# Patient Record
Sex: Female | Born: 1980
Health system: Southern US, Community
[De-identification: ages and names within clinical notes are randomized; demographics above are authoritative.]

## PROBLEM LIST (undated history)

## (undated) DIAGNOSIS — I871 Compression of vein: Secondary | ICD-10-CM

## (undated) DIAGNOSIS — K219 Gastro-esophageal reflux disease without esophagitis: Secondary | ICD-10-CM

## (undated) DIAGNOSIS — I2699 Other pulmonary embolism without acute cor pulmonale: Secondary | ICD-10-CM

## (undated) DIAGNOSIS — R51 Headache: Secondary | ICD-10-CM

## (undated) DIAGNOSIS — M797 Fibromyalgia: Secondary | ICD-10-CM

## (undated) DIAGNOSIS — R519 Headache, unspecified: Secondary | ICD-10-CM

## (undated) DIAGNOSIS — Z85820 Personal history of malignant melanoma of skin: Secondary | ICD-10-CM

## (undated) DIAGNOSIS — J329 Chronic sinusitis, unspecified: Secondary | ICD-10-CM

## (undated) DIAGNOSIS — R112 Nausea with vomiting, unspecified: Secondary | ICD-10-CM

## (undated) DIAGNOSIS — Z9889 Other specified postprocedural states: Secondary | ICD-10-CM

## (undated) HISTORY — DX: Chronic sinusitis, unspecified: J32.9

## (undated) HISTORY — DX: Fibromyalgia: M79.7

## (undated) HISTORY — PX: CHOLECYSTECTOMY: SHX55

## (undated) HISTORY — DX: Personal history of malignant melanoma of skin: Z85.820

## (undated) HISTORY — DX: Compression of vein: I87.1

---

## 2009-07-18 ENCOUNTER — Ambulatory Visit: Payer: Self-pay | Admitting: Diagnostic Radiology

## 2009-07-18 ENCOUNTER — Emergency Department (HOSPITAL_BASED_OUTPATIENT_CLINIC_OR_DEPARTMENT_OTHER): Admission: EM | Admit: 2009-07-18 | Discharge: 2009-07-18 | Payer: Self-pay | Admitting: Emergency Medicine

## 2011-01-28 LAB — URINALYSIS, ROUTINE W REFLEX MICROSCOPIC
Bilirubin Urine: NEGATIVE
Hgb urine dipstick: NEGATIVE
Nitrite: NEGATIVE
Specific Gravity, Urine: 1.005 (ref 1.005–1.030)
pH: 6 (ref 5.0–8.0)

## 2011-01-28 LAB — URINE MICROSCOPIC-ADD ON

## 2011-01-28 LAB — PREGNANCY, URINE: Preg Test, Ur: NEGATIVE

## 2015-08-26 ENCOUNTER — Encounter: Payer: Self-pay | Admitting: Family Medicine

## 2015-08-26 ENCOUNTER — Ambulatory Visit (INDEPENDENT_AMBULATORY_CARE_PROVIDER_SITE_OTHER): Payer: BLUE CROSS/BLUE SHIELD | Admitting: Family Medicine

## 2015-08-26 VITALS — BP 130/80 | HR 113 | Temp 99.7°F | Ht 67.25 in | Wt 158.0 lb

## 2015-08-26 DIAGNOSIS — L659 Nonscarring hair loss, unspecified: Secondary | ICD-10-CM

## 2015-08-26 DIAGNOSIS — Z0000 Encounter for general adult medical examination without abnormal findings: Secondary | ICD-10-CM

## 2015-08-26 DIAGNOSIS — M797 Fibromyalgia: Secondary | ICD-10-CM | POA: Diagnosis not present

## 2015-08-26 DIAGNOSIS — J302 Other seasonal allergic rhinitis: Secondary | ICD-10-CM | POA: Diagnosis not present

## 2015-08-26 DIAGNOSIS — R000 Tachycardia, unspecified: Secondary | ICD-10-CM | POA: Diagnosis not present

## 2015-08-26 MED ORDER — MONTELUKAST SODIUM 10 MG PO TABS: 10.0000 mg | ORAL_TABLET | Freq: Every day | ORAL | Status: DC

## 2015-08-26 MED ORDER — MONO-LINYAH 0.25-35 MG-MCG PO TABS: 1.0000 | ORAL_TABLET | Freq: Every day | ORAL | Status: DC

## 2015-08-26 MED ORDER — TRAZODONE HCL 50 MG PO TABS: 50.0000 mg | ORAL_TABLET | Freq: Every evening | ORAL | Status: DC | PRN

## 2015-08-26 NOTE — Progress Notes (Signed)
BP 130/80 mmHg  Pulse 113  Temp(Src) 99.7 F (37.6 C)  Ht 5' 7.25" (1.708 m)  Wt 158 lb (71.668 kg)  BMI 24.57 kg/m2  SpO2 100%  LMP 08/10/2015 (Approximate)   Subjective:    Patient ID: Ana Knapp, female    DOB: October 18, 1981, 34 y.o.   MRN: [id number]  HPI: Ana Knapp is a 34 y.o. female  Chief Complaint  Patient presents with  . Establish Care    would like to have her thyroid checked, she has family history of symptoms.   Patient is here to establish care  She will have episodes of sweating, feelig like body is hot and on fire; this has been happening for a while; hair started coming out not long ago; has trouble going to the bathroom, can go 3-4 days in between good BM; that's not new; doctors told her to take Miralax; she is eating Activia which helps; skin no big changes; she stopped exercising because she doesn't feel like herself; fatigue; heart rate fast today, but kind of nervous coming to the doctor; does Coke Zero with her Miralax; drinks water too  She has chronic sinusitis; was using drops under the tongue but lost job and couldn't afford it any more; that was for allergies; she has cat, but is allergic to her; keeps house clean; doing a lot of sneezing; she tries to move her away from her face; takes Claritin-D every day for that; she does not like nasal sprays  Fibromyalgia since age 34; not exercising any more for the last few months; might lift weights, but not nearly as much as before; trouble falling and staying asleep; she has been on anti-depressants but doesn't do well; has not   Past Medical History  Diagnosis Date  . Chronic sinusitis   . Fibromyalgia    On OCP; last pap was last year; no hx of abnormal paps; next due in 2018; she in on OCPs for period regulation; on it for 6 years; sexually active, thinking about other options; no migraines; nonsmoking  Relevant past medical, surgical, family and social history reviewed and updated as  indicated. Mother has thyroid problems, starting in her thirties  Allergies and medications reviewed and updated.  Review of Systems Per HPI unless specifically indicated above     Objective:    BP 130/80 mmHg  Pulse 113  Temp(Src) 99.7 F (37.6 C)  Ht 5' 7.25" (1.708 m)  Wt 158 lb (71.668 kg)  BMI 24.57 kg/m2  SpO2 100%  LMP 08/10/2015 (Approximate)  Wt Readings from Last 3 Encounters:  08/26/15 158 lb (71.668 kg)    Physical Exam  Constitutional: She appears well-developed and well-nourished.  HENT:  Head: Normocephalic and atraumatic.  Mouth/Throat: Mucous membranes are normal.  No visible areas of outright alopecia; no significant thinning or breakage of hair noted  Eyes: EOM are normal. No scleral icterus.  Cardiovascular: Regular rhythm.  Tachycardia present.   Pulmonary/Chest: Effort normal and breath sounds normal.  Psychiatric: She has a normal mood and affect. Her behavior is normal.       Assessment & Plan:   Problem List Items Addressed This Visit      Respiratory   Seasonal allergic rhinitis    Add montelukast; avoid triggers when possible        Musculoskeletal and Integument   Fibromyalgia    Importance of sleep, regular exercise discussed; will start low-dose trazodone      Hair loss    May be related  to thyroid dysfunction; could also be telogen effluvium        Other   Preventative health care    Physical not done; using z00.00 code for yearly screening labs      Relevant Orders   Lipid Panel w/o Chol/HDL Ratio (Completed)   Comprehensive metabolic panel (Completed)   Tachycardia with 100 - 120 beats per minute - Primary    Check labs to include thyroid function, CBC; further work-up if needed including EKG, holter monitor; avoid decongestants, caffeine      Relevant Orders   TSH (Completed)   T3, free (Completed)   T4, free (Completed)   CBC with Differential/Platelet (Completed)       Follow up plan: Return in about 1  month (around 09/25/2015) for follw-up, tachycardia, allergies.  An after-visit summary was printed and given to the patient at [place].  Please see the patient instructions which may contain other information and recommendations beyond what is mentioned above in the assessment and plan. Orders Placed This Encounter  Procedures  . TSH  . T3, free  . T4, free  . Lipid Panel w/o Chol/HDL Ratio  . CBC with Differential/Platelet  . Comprehensive metabolic panel   Meds ordered this encounter  Medications  . DISCONTD: MONO-LINYAH 0.25-35 MG-MCG tablet    Sig: Take by mouth daily.    Refill:  0  . traZODone (DESYREL) 50 MG tablet    Sig: Take 1 tablet (50 mg total) by mouth at bedtime as needed for sleep.    Dispense:  30 tablet    Refill:  3  . montelukast (SINGULAIR) 10 MG tablet    Sig: Take 1 tablet (10 mg total) by mouth at bedtime.    Dispense:  30 tablet    Refill:  3  . MONO-LINYAH 0.25-35 MG-MCG tablet    Sig: Take 1 tablet by mouth daily.    Dispense:  1 Package    Refill:  2

## 2015-08-26 NOTE — Patient Instructions (Addendum)
Try to use PLAIN allergy medicine without the decongestant Avoid: phenylephrine, phenylpropanolamine, and pseudoephredine Start montelukast (Singulair) for allergies, that is in addition to the anti-histamine Start the new medicine for sleep to help your fibromyalgia We'll get the labs and contact you Return in one month  Allergies An allergy is when your body reacts to a substance in a way that is not normal. An allergic reaction can happen after you:  Eat something.  Breathe in something.  Touch something. WHAT KINDS OF ALLERGIES ARE THERE? You can be allergic to:  Things that are only around during certain seasons, like molds and pollens.  Foods.  Drugs.  Insects.  Animal dander. WHAT ARE SYMPTOMS OF ALLERGIES?  Puffiness (swelling). This may happen on the lips, face, tongue, mouth, or throat.  Sneezing.  Coughing.  Breathing loudly (wheezing).  Stuffy nose.  Tingling in the mouth.  A rash.  Itching.  Itchy, red, puffy areas of skin (hives).  Watery eyes.  Throwing up (vomiting).  Watery poop (diarrhea).  Dizziness.  Feeling faint or fainting.  Trouble breathing or swallowing.  A tight feeling in the chest.  A fast heartbeat. HOW ARE ALLERGIES DIAGNOSED? Allergies can be diagnosed with:  A medical and family history.  Skin tests.  Blood tests.  A food diary. A food diary is a record of all the foods, drinks, and symptoms you have each day.  The results of an elimination diet. This diet involves making sure not to eat certain foods and then seeing what happens when you start eating them again. HOW ARE ALLERGIES TREATED? There is no cure for allergies, but allergic reactions can be treated with medicine. Severe reactions usually need to be treated at a hospital.  HOW CAN REACTIONS BE PREVENTED? The best way to prevent an allergic reaction is to avoid the thing you are allergic to. Allergy shots and medicines can also help prevent reactions  in some cases.   This information is not intended to replace advice given to you by your health care provider. Make sure you discuss any questions you have with your health care provider.   Document Released: 02/04/2013 Document Revised: 10/31/2014 Document Reviewed: 07/22/2014 Elsevier Interactive Patient Education [employer].

## 2015-08-27 LAB — COMPREHENSIVE METABOLIC PANEL
A/G RATIO: 1.5 (ref 1.1–2.5)
ALBUMIN: 4.1 g/dL (ref 3.5–5.5)
ALT: 17 IU/L (ref 0–32)
AST: 16 IU/L (ref 0–40)
Alkaline Phosphatase: 24 IU/L — ABNORMAL LOW (ref 39–117)
BUN / CREAT RATIO: 23 — AB (ref 8–20)
BUN: 18 mg/dL (ref 6–20)
Bilirubin Total: 0.2 mg/dL (ref 0.0–1.2)
CALCIUM: 9.1 mg/dL (ref 8.7–10.2)
CO2: 24 mmol/L (ref 18–29)
CREATININE: 0.8 mg/dL (ref 0.57–1.00)
Chloride: 97 mmol/L (ref 97–106)
GFR calc Af Amer: 110 mL/min/{1.73_m2} (ref >59)
GFR, EST NON AFRICAN AMERICAN: 95 mL/min/{1.73_m2} (ref >59)
GLOBULIN, TOTAL: 2.8 g/dL (ref 1.5–4.5)
Glucose: 121 mg/dL — ABNORMAL HIGH (ref 65–99)
POTASSIUM: 3.9 mmol/L (ref 3.5–5.2)
SODIUM: 137 mmol/L (ref 136–144)
Total Protein: 6.9 g/dL (ref 6.0–8.5)

## 2015-08-27 LAB — CBC WITH DIFFERENTIAL/PLATELET
BASOS: 1 %
Basophils Absolute: 0.1 10*3/uL (ref 0.0–0.2)
EOS (ABSOLUTE): 0.4 10*3/uL (ref 0.0–0.4)
EOS: 5 %
HEMATOCRIT: 36.8 % (ref 34.0–46.6)
HEMOGLOBIN: 12.3 g/dL (ref 11.1–15.9)
IMMATURE GRANULOCYTES: 0 %
Immature Grans (Abs): 0 10*3/uL (ref 0.0–0.1)
LYMPHS: 32 %
Lymphocytes Absolute: 2.4 10*3/uL (ref 0.7–3.1)
MCH: 27.7 pg (ref 26.6–33.0)
MCHC: 33.4 g/dL (ref 31.5–35.7)
MCV: 83 fL (ref 79–97)
MONOCYTES: 5 %
MONOS ABS: 0.4 10*3/uL (ref 0.1–0.9)
NEUTROS PCT: 57 %
Neutrophils Absolute: 4.3 10*3/uL (ref 1.4–7.0)
Platelets: 259 10*3/uL (ref 150–379)
RBC: 4.44 x10E6/uL (ref 3.77–5.28)
RDW: 14.4 % (ref 12.3–15.4)
WBC: 7.6 10*3/uL (ref 3.4–10.8)

## 2015-08-27 LAB — LIPID PANEL W/O CHOL/HDL RATIO
Cholesterol, Total: 161 mg/dL (ref 100–199)
HDL: 77 mg/dL (ref >39)
LDL Calculated: 73 mg/dL (ref 0–99)
Triglycerides: 55 mg/dL (ref 0–149)
VLDL Cholesterol Cal: 11 mg/dL (ref 5–40)

## 2015-08-27 LAB — TSH: TSH: 1.82 u[IU]/mL (ref 0.450–4.500)

## 2015-08-27 LAB — T3, FREE: T3 FREE: 2.8 pg/mL (ref 2.0–4.4)

## 2015-08-27 LAB — T4, FREE: FREE T4: 1.08 ng/dL (ref 0.82–1.77)

## 2015-08-30 DIAGNOSIS — Z0000 Encounter for general adult medical examination without abnormal findings: Secondary | ICD-10-CM | POA: Insufficient documentation

## 2015-08-30 DIAGNOSIS — L659 Nonscarring hair loss, unspecified: Secondary | ICD-10-CM | POA: Insufficient documentation

## 2015-08-30 DIAGNOSIS — R000 Tachycardia, unspecified: Secondary | ICD-10-CM | POA: Insufficient documentation

## 2015-08-30 DIAGNOSIS — M797 Fibromyalgia: Secondary | ICD-10-CM | POA: Insufficient documentation

## 2015-08-30 DIAGNOSIS — J302 Other seasonal allergic rhinitis: Secondary | ICD-10-CM | POA: Insufficient documentation

## 2015-08-30 NOTE — Assessment & Plan Note (Signed)
Physical not done; using z00.00 code for yearly screening labs

## 2015-08-30 NOTE — Assessment & Plan Note (Addendum)
Check labs to include thyroid function, CBC; further work-up if needed including EKG, holter monitor; avoid decongestants, caffeine

## 2015-08-30 NOTE — Assessment & Plan Note (Signed)
May be related to thyroid dysfunction; could also be telogen effluvium

## 2015-08-30 NOTE — Assessment & Plan Note (Addendum)
Importance of sleep, regular exercise discussed; will start low-dose trazodone

## 2015-08-30 NOTE — Assessment & Plan Note (Signed)
Add montelukast; avoid triggers when possible

## 2015-09-24 ENCOUNTER — Encounter: Payer: Self-pay | Admitting: Family Medicine

## 2015-09-24 ENCOUNTER — Ambulatory Visit (INDEPENDENT_AMBULATORY_CARE_PROVIDER_SITE_OTHER): Payer: BLUE CROSS/BLUE SHIELD | Admitting: Family Medicine

## 2015-09-24 VITALS — BP 119/71 | HR 82 | Temp 98.6°F | Wt 162.0 lb

## 2015-09-24 DIAGNOSIS — Z30011 Encounter for initial prescription of contraceptive pills: Secondary | ICD-10-CM | POA: Diagnosis not present

## 2015-09-24 DIAGNOSIS — R1013 Epigastric pain: Secondary | ICD-10-CM | POA: Diagnosis not present

## 2015-09-24 DIAGNOSIS — G8929 Other chronic pain: Secondary | ICD-10-CM

## 2015-09-24 DIAGNOSIS — K219 Gastro-esophageal reflux disease without esophagitis: Secondary | ICD-10-CM

## 2015-09-24 DIAGNOSIS — L659 Nonscarring hair loss, unspecified: Secondary | ICD-10-CM | POA: Diagnosis not present

## 2015-09-24 DIAGNOSIS — R232 Flushing: Secondary | ICD-10-CM

## 2015-09-24 MED ORDER — SUCRALFATE 1 GM/10ML PO SUSP: 1.0000 g | Freq: Three times a day (TID) | ORAL | Status: DC

## 2015-09-24 MED ORDER — NORGESTIM-ETH ESTRAD TRIPHASIC 0.18/0.215/0.25 MG-35 MCG PO TABS: 1.0000 | ORAL_TABLET | Freq: Every day | ORAL | Status: DC

## 2015-09-24 MED ORDER — PANTOPRAZOLE SODIUM 40 MG PO TBEC: 40.0000 mg | DELAYED_RELEASE_TABLET | Freq: Every day | ORAL | Status: DC

## 2015-09-24 NOTE — Progress Notes (Signed)
BP 119/71 mmHg  Pulse 82  Temp(Src) 98.6 F (37 C)  Wt 162 lb (73.483 kg)  SpO2 98%  LMP 09/10/2015   Subjective:    Patient ID: Ana Knapp, female    DOB: 09/26/1981, 34 y.o.   MRN: [id number]  HPI: Ana Knapp is a 34 y.o. female  Chief Complaint  Patient presents with  . Tachycardia    follow up, still notices it sometimes at night  . Allergic Rhinitis   . Gastroesophageal Reflux   She is still having some heart beating rapidly at night; she has stopped working out; she just break out in hot sweat; day or night; can happen for any reason; not related to ambient temperature; she already did the holter monitor in college; fast heart back then; no one in the family with re-entrant tachycardia; she drinks coke zero and green tea, lots of water; stopped decongestants  She is using OCP, Mono-Linyah, and wonders if that might be the hair loss; has been on it for a few years; hair loss is not quite as bad, slowed down a little bit, leveled off That is the only OCP she has been on ever; nonsmoker; she has had headaches, but thinks it is just regular sinus headaches, no aura, no migraine Taking a vitamin to help with energy level; she is gaining weight; 145 pounds would be a good weight for her  Sneezing a lot lately; sinuses have been pretty bad; she has used nasal sprays in the past, but doesn't really help, even gets sinus infection  She has been having problems with GERD, reflux; using OTC Nexium; has pretty bad across the upper abdomen; took tums,   Does gets nauseated at times and feels anxoius with the spells of sweating; few mood swings; might be related to birth control she thinks  Hard time swallowing; abdominal pain in the epigastrium and left of midline; going on for two years; had the EGD and they told her to take miralax; they told her she had a "back-up" (stool presumably); no blood in stool; taking miralax and activia  Relevant past medical, surgical, family and  social history reviewed and updated as indicated Past Medical History  Diagnosis Date  . Chronic sinusitis   . Fibromyalgia    Interim medical history since our last visit reviewed. Allergies and medications reviewed and updated.  Review of Systems  Per HPI unless specifically indicated above     Objective:    BP 119/71 mmHg  Pulse 82  Temp(Src) 98.6 F (37 C)  Wt 162 lb (73.483 kg)  SpO2 98%  LMP 09/10/2015  Wt Readings from Last 3 Encounters:  09/24/15 162 lb (73.483 kg)  08/26/15 158 lb (71.668 kg)    Physical Exam  Constitutional: She appears well-developed and well-nourished. She does not appear ill. No distress.  Weight gain four pounds in last month  HENT:  Head: Normocephalic and atraumatic. Hair is normal.  Mouth/Throat: Oropharynx is clear and moist and mucous membranes are normal.  Neck: No JVD present. No thyromegaly present.  Cardiovascular: Normal rate and regular rhythm.   No murmur heard. Pulmonary/Chest: Effort normal and breath sounds normal.  Abdominal: Soft. Bowel sounds are normal. She exhibits no mass. There is no hepatosplenomegaly. There is tenderness (mildly tender epigastrium). There is no rigidity and no guarding.  Neurological: She displays no tremor.  Skin: No ecchymosis noted. She is not diaphoretic. No erythema. No pallor.  No flushing  Psychiatric: She has a normal mood and affect.  Her mood appears not anxious.   Results for orders placed or performed in visit on 08/26/15  TSH  Result Value Ref Range   TSH 1.820 0.450 - 4.500 uIU/mL  T3, free  Result Value Ref Range   T3, Free 2.8 2.0 - 4.4 pg/mL  T4, free  Result Value Ref Range   Free T4 1.08 0.82 - 1.77 ng/dL  Lipid Panel w/o Chol/HDL Ratio  Result Value Ref Range   Cholesterol, Total 161 100 - 199 mg/dL   Triglycerides 55 0 - 149 mg/dL   HDL 77 >39 mg/dL   VLDL Cholesterol Cal 11 5 - 40 mg/dL   LDL Calculated 73 0 - 99 mg/dL  CBC with Differential/Platelet  Result Value  Ref Range   WBC 7.6 3.4 - 10.8 x10E3/uL   RBC 4.44 3.77 - 5.28 x10E6/uL   Hemoglobin 12.3 11.1 - 15.9 g/dL   Hematocrit 36.8 34.0 - 46.6 %   MCV 83 79 - 97 fL   MCH 27.7 26.6 - 33.0 pg   MCHC 33.4 31.5 - 35.7 g/dL   RDW 14.4 12.3 - 15.4 %   Platelets 259 150 - 379 x10E3/uL   Neutrophils 57 %   Lymphs 32 %   Monocytes 5 %   Eos 5 %   Basos 1 %   Neutrophils Absolute 4.3 1.4 - 7.0 x10E3/uL   Lymphocytes Absolute 2.4 0.7 - 3.1 x10E3/uL   Monocytes Absolute 0.4 0.1 - 0.9 x10E3/uL   EOS (ABSOLUTE) 0.4 0.0 - 0.4 x10E3/uL   Basophils Absolute 0.1 0.0 - 0.2 x10E3/uL   Immature Granulocytes 0 %   Immature Grans (Abs) 0.0 0.0 - 0.1 x10E3/uL  Comprehensive metabolic panel  Result Value Ref Range   Glucose 121 (H) 65 - 99 mg/dL   BUN 18 6 - 20 mg/dL   Creatinine, Ser 0.80 0.57 - 1.00 mg/dL   GFR calc non Af Amer 95 >59 mL/min/1.73   GFR calc Af Amer 110 >59 mL/min/1.73   BUN/Creatinine Ratio 23 (H) 8 - 20   Sodium 137 136 - 144 mmol/L   Potassium 3.9 3.5 - 5.2 mmol/L   Chloride 97 97 - 106 mmol/L   CO2 24 18 - 29 mmol/L   Calcium 9.1 8.7 - 10.2 mg/dL   Total Protein 6.9 6.0 - 8.5 g/dL   Albumin 4.1 3.5 - 5.5 g/dL   Globulin, Total 2.8 1.5 - 4.5 g/dL   Albumin/Globulin Ratio 1.5 1.1 - 2.5   Bilirubin Total 0.2 0.0 - 1.2 mg/dL   Alkaline Phosphatase 24 (L) 39 - 117 IU/L   AST 16 0 - 40 IU/L   ALT 17 0 - 32 IU/L      Assessment & Plan:   Problem List Items Addressed This Visit      Cardiovascular and Mediastinum   Flushing - Primary    With abdominal pain; will get 24 hour urine for 5-HIAA      Relevant Orders   5 HIAA, quantitative, Urine, 24 hour     Digestive   GERD (gastroesophageal reflux disease)   Relevant Medications   sucralfate (CARAFATE) 1 GM/10ML suspension   pantoprazole (PROTONIX) 40 MG tablet     Musculoskeletal and Integument   Hair loss    No visible alopecia today; thyroid function tests were normal; may have aspect of telogen effluvium; follow         Other   Abdominal pain, chronic, epigastric    She has apparently had scans, seen GI,  had EGD; ddx includes gastritis, esophagitis; with chronicity and tachycardia and flushing, worth ruling out carcinoid tumor; start medicine and patient to call me with an update in a few weeks      Oral contraceptive prescribed    Will stop current OCP and start another; reviewed risk of stroke, DVT, PE         Follow up plan: No Follow-up on file.   An after-visit summary was printed and given to the patient at [place].  Please see the patient instructions which may contain other information and recommendations beyond what is mentioned above in the assessment and plan. Orders Placed This Encounter  Procedures  . 5 HIAA, quantitative, Urine, 24 hour   Meds ordered this encounter  Medications  . sucralfate (CARAFATE) 1 GM/10ML suspension    Sig: Take 10 mLs (1 g total) by mouth 4 (four) times daily -  with meals and at bedtime.    Dispense:  420 mL    Refill:  0  . pantoprazole (PROTONIX) 40 MG tablet    Sig: Take 1 tablet (40 mg total) by mouth daily.    Dispense:  30 tablet    Refill:  2  . Norgestimate-Ethinyl Estradiol Triphasic (ORTHO TRI-CYCLEN, 28,) 0.18/0.215/0.25 MG-35 MCG tablet    Sig: Take 1 tablet by mouth daily.    Dispense:  1 Package    Refill:  11   Face-to-face time with patient was more than 25 minutes, >50% time spent counseling and coordination of care

## 2015-09-24 NOTE — Patient Instructions (Signed)
Start the new stomach acid reducing medicine pantoprazole Avoid triggers Use the carafate / sucralfate four times a day for the next two weeks Call me with an update in two weeks and we'll decide where to go from there Do the 24 hour urine collection after your next episode of flushing and anxiety Start the new birth control pill pack after you finish your current one, and give me an update after a month or two

## 2015-09-24 NOTE — Progress Notes (Signed)
Pharmacy notified.

## 2015-09-28 DIAGNOSIS — Z30011 Encounter for initial prescription of contraceptive pills: Secondary | ICD-10-CM | POA: Insufficient documentation

## 2015-09-28 DIAGNOSIS — G8929 Other chronic pain: Secondary | ICD-10-CM | POA: Insufficient documentation

## 2015-09-28 DIAGNOSIS — K219 Gastro-esophageal reflux disease without esophagitis: Secondary | ICD-10-CM | POA: Insufficient documentation

## 2015-09-28 DIAGNOSIS — R1013 Epigastric pain: Secondary | ICD-10-CM

## 2015-09-28 NOTE — Assessment & Plan Note (Signed)
With abdominal pain; will get 24 hour urine for 5-HIAA

## 2015-09-28 NOTE — Assessment & Plan Note (Signed)
No visible alopecia today; thyroid function tests were normal; may have aspect of telogen effluvium; follow

## 2015-09-28 NOTE — Assessment & Plan Note (Signed)
She has apparently had scans, seen GI, had EGD; ddx includes gastritis, esophagitis; with chronicity and tachycardia and flushing, worth ruling out carcinoid tumor; start medicine and patient to call me with an update in a few weeks

## 2015-09-28 NOTE — Assessment & Plan Note (Signed)
Will stop current OCP and start another; reviewed risk of stroke, DVT, PE

## 2015-10-08 ENCOUNTER — Encounter: Payer: Self-pay | Admitting: Family Medicine

## 2015-10-08 DIAGNOSIS — J349 Unspecified disorder of nose and nasal sinuses: Secondary | ICD-10-CM

## 2015-10-08 NOTE — Telephone Encounter (Signed)
Routing to provider  

## 2015-10-12 NOTE — Telephone Encounter (Signed)
Routing back to provider for referral

## 2015-10-13 DIAGNOSIS — J349 Unspecified disorder of nose and nasal sinuses: Secondary | ICD-10-CM | POA: Insufficient documentation

## 2015-10-13 NOTE — Addendum Note (Signed)
Addended by: Knut Rondinelli, Satira Anis on: 10/13/2015 01:19 PM   Modules accepted: Orders

## 2015-10-25 DIAGNOSIS — I2699 Other pulmonary embolism without acute cor pulmonale: Secondary | ICD-10-CM | POA: Insufficient documentation

## 2015-11-03 ENCOUNTER — Encounter: Payer: Self-pay | Admitting: Family Medicine

## 2015-11-03 NOTE — Telephone Encounter (Signed)
I called home number after receiving MyChart msg from patient; voice mailbox has not been set up yet so I could not leave msg

## 2015-11-04 ENCOUNTER — Encounter: Payer: Self-pay | Admitting: Family Medicine

## 2015-11-04 ENCOUNTER — Emergency Department
Admission: EM | Admit: 2015-11-04 | Discharge: 2015-11-04 | Disposition: A | Discharge status: Home / Self Care | Payer: BLUE CROSS/BLUE SHIELD | Attending: Emergency Medicine | Admitting: Emergency Medicine

## 2015-11-04 ENCOUNTER — Encounter: Payer: Self-pay | Admitting: *Deleted

## 2015-11-04 ENCOUNTER — Ambulatory Visit (INDEPENDENT_AMBULATORY_CARE_PROVIDER_SITE_OTHER): Payer: BLUE CROSS/BLUE SHIELD

## 2015-11-04 ENCOUNTER — Ambulatory Visit
Admission: EM | Admit: 2015-11-04 | Discharge: 2015-11-04 | Disposition: A | Discharge status: Home / Self Care | Payer: BLUE CROSS/BLUE SHIELD | Attending: Family Medicine | Admitting: Family Medicine

## 2015-11-04 ENCOUNTER — Encounter: Payer: Self-pay | Admitting: Emergency Medicine

## 2015-11-04 DIAGNOSIS — Z792 Long term (current) use of antibiotics: Secondary | ICD-10-CM | POA: Insufficient documentation

## 2015-11-04 DIAGNOSIS — J0100 Acute maxillary sinusitis, unspecified: Secondary | ICD-10-CM

## 2015-11-04 DIAGNOSIS — R232 Flushing: Secondary | ICD-10-CM

## 2015-11-04 DIAGNOSIS — Z793 Long term (current) use of hormonal contraceptives: Secondary | ICD-10-CM | POA: Diagnosis not present

## 2015-11-04 DIAGNOSIS — J40 Bronchitis, not specified as acute or chronic: Secondary | ICD-10-CM

## 2015-11-04 DIAGNOSIS — J069 Acute upper respiratory infection, unspecified: Secondary | ICD-10-CM | POA: Diagnosis not present

## 2015-11-04 DIAGNOSIS — Z7952 Long term (current) use of systemic steroids: Secondary | ICD-10-CM | POA: Insufficient documentation

## 2015-11-04 DIAGNOSIS — Z79899 Other long term (current) drug therapy: Secondary | ICD-10-CM | POA: Insufficient documentation

## 2015-11-04 DIAGNOSIS — J45901 Unspecified asthma with (acute) exacerbation: Secondary | ICD-10-CM | POA: Diagnosis not present

## 2015-11-04 DIAGNOSIS — Z7951 Long term (current) use of inhaled steroids: Secondary | ICD-10-CM | POA: Insufficient documentation

## 2015-11-04 DIAGNOSIS — J0110 Acute frontal sinusitis, unspecified: Secondary | ICD-10-CM | POA: Diagnosis not present

## 2015-11-04 DIAGNOSIS — T380X5A Adverse effect of glucocorticoids and synthetic analogues, initial encounter: Secondary | ICD-10-CM | POA: Insufficient documentation

## 2015-11-04 DIAGNOSIS — R0602 Shortness of breath: Secondary | ICD-10-CM | POA: Diagnosis present

## 2015-11-04 DIAGNOSIS — J209 Acute bronchitis, unspecified: Secondary | ICD-10-CM

## 2015-11-04 DIAGNOSIS — J9801 Acute bronchospasm: Secondary | ICD-10-CM

## 2015-11-04 MED ORDER — GUAIFENESIN-CODEINE 100-10 MG/5ML PO SOLN: 5.0000 mL | Freq: Three times a day (TID) | ORAL | Status: DC | PRN

## 2015-11-04 MED ORDER — LEVOFLOXACIN 750 MG PO TABS: 750.0000 mg | ORAL_TABLET | Freq: Every day | ORAL | Status: AC

## 2015-11-04 MED ORDER — IPRATROPIUM-ALBUTEROL 0.5-2.5 (3) MG/3ML IN SOLN
3.0000 mL | Freq: Four times a day (QID) | RESPIRATORY_TRACT | Status: DC
  Administered 2015-11-04: 3 mL via RESPIRATORY_TRACT

## 2015-11-04 NOTE — ED Provider Notes (Signed)
[facility]  ____________________________________________  Time seen: Approximately 12:36 PM  I have reviewed the triage vital signs and the nursing notes.   HISTORY   Chief Complaint Cough and Asthma  HPI Ana Knapp is a 35 y.o. female patient presents for the complaints of 2 weeks of runny nose, nasal congestion, sinus drainage and sinus pain with intermittent cough. Patient reports over the last week her cough has continued. Patient reports that the cough is primarily a dry cough. Patient reports that she intermittently feels tightness in her chest when coughing and intermittently hears wheezing. Patient states that she does have an occasional shortness of breath when actively coughing and wheezing. Denies other shortness of breath. Denies shortness of breath with walking or activity, or resting shortness of breath.  Patient reports that she is continuing to have sinus drainage that is thick with sinus pressure in her forehead and cheeks. States current sinus pressure is 4 out of 10 aching. Also reports some ear fullness and feeling like it is clogged to both ears. Patient states that cough, tightness in chest, wheezing and shortness of breath primarily occurs at night, and she can feel drainage in the back or throat. Patient denies any shortness of breath at this time.    denies chest pain, shortness of breath, abdominal pain, weakness, dizziness, syncope, near-syncope, pain with deep breath, rib pain, back pain, leg swelling, calf pain, recent trips, recent immobilizations, fevers. Reports continues to eat and drink well.  Patient reports that she has been following with Dr. Richardson Landry ENT for her chronic seasonal allergies. Patient reports that the week of Christmas she had a sinus infection which was treated with oral Cefdinir. Patient states that she saw Dr. Richardson Landry this past week for the same. Patient states at that time she was started on prednisone six-day taper as well as  inhalers. Patient states that she currently has albuterol and Flovent inhaler which helps with wheezing.Reports has 3-4 days of her prednisone left.  Last menstrual: Current. Denies chance of pregnancy.  PCP: [facility]: Dr. Richardson Landry   Past Medical History  Diagnosis Date  . Chronic sinusitis   . Fibromyalgia   . Asthma     Patient Active Problem List   Diagnosis Date Noted  . Sinus problem 10/13/2015  . Abdominal pain, chronic, epigastric 09/28/2015  . GERD (gastroesophageal reflux disease) 09/28/2015  . Oral contraceptive prescribed 09/28/2015  . Flushing 09/24/2015  . Preventative health care 08/30/2015  . Fibromyalgia 08/30/2015  . Seasonal allergic rhinitis 08/30/2015  . Hair loss 08/30/2015    History reviewed. No pertinent past surgical history.  Current Outpatient Rx  Name  Route  Sig  Dispense  Refill  . albuterol (PROVENTIL HFA;VENTOLIN HFA) 108 (90 Base) MCG/ACT inhaler   Inhalation   Inhale 2 puffs into the lungs every 6 (six) hours as needed for wheezing or shortness of breath.         . fluticasone (FLONASE) 50 MCG/ACT nasal spray   Each Nare   Place 2 sprays into both nostrils daily.         . predniSONE (DELTASONE) 10 MG tablet   Oral   Take 10 mg by mouth daily with breakfast.         .           .           . montelukast (SINGULAIR) 10 MG tablet   Oral   Take 1 tablet (10 mg total) by mouth at bedtime.  30 tablet   3   . Norgestimate-Ethinyl Estradiol Triphasic (ORTHO TRI-CYCLEN, 28,) 0.18/0.215/0.25 MG-35 MCG tablet   Oral   Take 1 tablet by mouth daily.   1 Package   11   . pantoprazole (PROTONIX) 40 MG tablet   Oral   Take 1 tablet (40 mg total) by mouth daily.   30 tablet   2   . sucralfate (CARAFATE) 1 GM/10ML suspension   Oral   Take 10 mLs (1 g total) by mouth 4 (four) times daily -  with meals and at bedtime.   420 mL   0   . traZODone (DESYREL) 50 MG tablet   Oral   Take 1 tablet (50 mg total) by mouth at bedtime  as needed for sleep.   30 tablet   3     Allergies Augmentin  Family History  Problem Relation Age of Onset  . Thyroid disease Mother   . Diabetes Mother   . Cancer Father     bladder  . Hypertension Maternal Grandmother   . Thyroid disease Paternal Grandmother   . Diabetes Paternal Grandmother   . Stroke Paternal Grandmother   . Cancer Paternal Grandfather     skin  . Heart disease Neg Hx   . COPD Neg Hx     Social History Social History  Substance Use Topics  . Smoking status: Never Smoker   . Smokeless tobacco: Never Used  . Alcohol Use: Yes     Comment: rare    Review of Systems Constitutional: No fever/chills Eyes: No visual changes. ENT: No sore throat. Positive runny nose, nasal congestion, sinus drainage and sinus pressure. Cardiovascular: Denies chest pain. Respiratory: Positive cough and congestion as above. Gastrointestinal: No abdominal pain.  No nausea, no vomiting.  No diarrhea.  No constipation. Genitourinary: Negative for dysuria. Musculoskeletal: Negative for back pain. Skin: Negative for rash. Neurological: Negative for headaches, focal weakness or numbness.  10-point ROS otherwise negative.  ____________________________________________   PHYSICAL EXAM:  VITAL SIGNS: ED Triage Vitals  Enc Vitals Group     BP 11/04/15 1053 129/69 mmHg     Pulse Rate 11/04/15 1053 77     Resp 11/04/15 1053 16     Temp 11/04/15 1053 99.1 F (37.3 C)     Temp Source 11/04/15 1053 Tympanic     SpO2 11/04/15 1053 100 %     Weight 11/04/15 1053 153 lb (69.4 kg)     Height 11/04/15 1053 5\' 9"  (1.753 m)     Head Cir --      Peak Flow --      Pain Score --      Pain Loc --      Pain Edu? --      Excl. in [place]? --     Constitutional: Alert and oriented. Well appearing and in no acute distress. Eyes: Conjunctivae are normal. PERRL. EOMI. Head: Atraumatic. Moderate tenderness to palpation bilateral frontal sinuses, mild tenderness to palpation mild  maxillary sinuses. No swelling. No erythema.  Ears: no erythema, normal TMs bilaterally.   Nose: Nasal congestion with mild purulent nasal drainage,   Mouth/Throat: Mucous membranes are moist.  Oropharynx non-erythematous. No tonsillar swelling or exudate Neck: No stridor.  No cervical spine tenderness to palpation. Hematological/Lymphatic/Immunilogical: No cervical lymphadenopathy. Cardiovascular: Normal rate, regular rhythm. Grossly normal heart sounds.  Good peripheral circulation. Respiratory: Normal respiratory effort.  No retractions. Scattered mild inspiratory wheezes, no rhonchi. Mild dry intermittent cough with noted bronchospasm with cough.  Speaks in complete sentences.  Gastrointestinal: Soft and nontender. No distention. Normal Bowel sounds.  No abdominal bruits. No CVA tenderness. Musculoskeletal: No lower or upper extremity tenderness nor edema.  Bilateral pedal pulses equal and easily palpated.  Neurologic:  Normal speech and language. No gross focal neurologic deficits are appreciated. No gait instability. Skin:  Skin is warm, dry and intact. No rash noted. Psychiatric: Mood and affect are normal. Speech and behavior are normal.  ____________________________________________   LABS (all labs ordered are listed, but only abnormal results are displayed)  Labs Reviewed - No data to display  RADIOLOGY  EXAM: CHEST 2 VIEW  COMPARISON: None.  FINDINGS: The lungs are clear. Heart size is normal. No pneumothorax or pleural effusion.  IMPRESSION: Negative chest.   Electronically Signed By: Inge Rise M.D. On: 11/04/2015 12:15      I, Marylene Land, personally viewed and evaluated these images (plain radiographs) as part of my medical decision making, as well as reviewing the written report by the radiologist.    [place] / [place] / ED COURSE  Pertinent labs & imaging results that were available during my care of the patient  were reviewed by me and considered in my medical decision making (see chart for details).  very well-appearing patient. No acute distress. Presents for the complaint of continued runny nose, nasal congestion and sinus drainage over the last 2-3 weeks. Patient also reports over the last 1-2 weeks with increased cough in which patient states has progressed to wheezing with cough, tightness in chest and intermittent shortness of breath with wheezing. Patient states the shortness breath is only with wheezing and denies shortness of breath otherwise. Denies current shortness of breath. Denies chest pain. Patient reports overall feels well at this time but the wheezing increases at night. Moderate tenderness to palpation bilateral frontal sinuses and mild tenderness to palpation bilateral maxillary sinuses. Nasal congestion and nasal turbinate erythema. Mild scattered wheezes noted on inspiration as well as slight wheeze noted with cough. Will evaluate chest x-ray. Albuterol ipratropium neb 1 in urgent care.  Chest x-ray per radiologist negative. Post albuterol ipratropium neb wheezes fully resolved, and patient reports feeling much better, and patient reports chest tightness is fully resolved; denies chest pain or shortness of breath. Ambulatory in room with steady gait and continues to deny shortness of breath with ambulation.  Suspect sinusitis unresolved with oral Omnicef as well as bronchitis with acute bronchospasm. Counseled patient to continue prednisone that she has at home and reports that she has 3 days left of the prednisone. Will treat patient with oral Levaquin for sinusitis as well as bronchitis. Continue home inhalers as needed for wheezing. Will also prescribe when necessary guaifenesin with codeine as needed for cough. Counseled close follow-up with primary care physician in the next 2-3 days. Encouraged rest and fluids. Work note given for today and tomorrow per patient request.   Discussed  follow up with Primary care physician this week. Discussed follow up and return parameters including  return to urgent care proceed to ER for chest pain, shortness of breath, dizziness, weakness, no resolution or any worsening concerns. Patient verbalized understanding and agreed to plan.   ____________________________________________   FINAL CLINICAL IMPRESSION(S) / ED DIAGNOSES  Final diagnoses:  Acute frontal sinusitis, recurrence not specified  Acute maxillary sinusitis, recurrence not specified  Bronchitis with bronchospasm       Marylene Land, NP 11/04/15 2109

## 2015-11-04 NOTE — ED Notes (Signed)
Pt to triage via wheelchair.  Pt reports feeling sob since early this morning.  Pt was seen at urgent care today  Pt was given a breathing treatment and felt better.  Pt states i think i'm having a reaction to prednisone.  Pt has been on prednisone for 4 days and started levaquin today and took 1 pill.  No rash noted.  Pt anxious.

## 2015-11-04 NOTE — ED Notes (Signed)
Pt c/o SOB that began last night.  Pt able to talk w/o difficulty.  Pt sts that SOB began this AM and inhaler helped some.  Pt appears mildly anxious.  NAD.

## 2015-11-04 NOTE — Assessment & Plan Note (Signed)
Check labs to r/o carcinoid

## 2015-11-04 NOTE — Discharge Instructions (Signed)
Take medications as prescribed. Rest. Continue home medications.   Follow-up closely with your primary care physician. As discussed follow-up closely in the next 2-3 days. Return to urgent care or proceed to ER for chest pain, shortness of breath, dizziness, weakness, new or worsening concerns.   Sinusitis, Adult Sinusitis is redness, soreness, and inflammation of the paranasal sinuses. Paranasal sinuses are air pockets within the bones of your face. They are located beneath your eyes, in the middle of your forehead, and above your eyes. In healthy paranasal sinuses, mucus is able to drain out, and air is able to circulate through them by way of your nose. However, when your paranasal sinuses are inflamed, mucus and air can become trapped. This can allow bacteria and other germs to grow and cause infection. Sinusitis can develop quickly and last only a short time (acute) or continue over a long period (chronic). Sinusitis that lasts for more than 12 weeks is considered chronic. CAUSES Causes of sinusitis include:  Allergies.  Structural abnormalities, such as displacement of the cartilage that separates your nostrils (deviated septum), which can decrease the air flow through your nose and sinuses and affect sinus drainage.  Functional abnormalities, such as when the small hairs (cilia) that line your sinuses and help remove mucus do not work properly or are not present. SIGNS AND SYMPTOMS Symptoms of acute and chronic sinusitis are the same. The primary symptoms are pain and pressure around the affected sinuses. Other symptoms include:  Upper toothache.  Earache.  Headache.  Bad breath.  Decreased sense of smell and taste.  A cough, which worsens when you are lying flat.  Fatigue.  Fever.  Thick drainage from your nose, which often is green and may contain pus (purulent).  Swelling and warmth over the affected sinuses. DIAGNOSIS Your health care provider will perform a physical  exam. During your exam, your health care provider may perform any of the following to help determine if you have acute sinusitis or chronic sinusitis:  Look in your nose for signs of abnormal growths in your nostrils (nasal polyps).  Tap over the affected sinus to check for signs of infection.  View the inside of your sinuses using an imaging device that has a light attached (endoscope). If your health care provider suspects that you have chronic sinusitis, one or more of the following tests may be recommended:  Allergy tests.  Nasal culture. A sample of mucus is taken from your nose, sent to a lab, and screened for bacteria.  Nasal cytology. A sample of mucus is taken from your nose and examined by your health care provider to determine if your sinusitis is related to an allergy. TREATMENT Most cases of acute sinusitis are related to a viral infection and will resolve on their own within 10 days. Sometimes, medicines are prescribed to help relieve symptoms of both acute and chronic sinusitis. These may include pain medicines, decongestants, nasal steroid sprays, or saline sprays. However, for sinusitis related to a bacterial infection, your health care provider will prescribe antibiotic medicines. These are medicines that will help kill the bacteria causing the infection. Rarely, sinusitis is caused by a fungal infection. In these cases, your health care provider will prescribe antifungal medicine. For some cases of chronic sinusitis, surgery is needed. Generally, these are cases in which sinusitis recurs more than 3 times per year, despite other treatments. HOME CARE INSTRUCTIONS  Drink plenty of water. Water helps thin the mucus so your sinuses can drain more easily.  Use a humidifier.  Inhale steam 3-4 times a day (for example, sit in the bathroom with the shower running).  Apply a warm, moist washcloth to your face 3-4 times a day, or as directed by your health care provider.  Use  saline nasal sprays to help moisten and clean your sinuses.  Take medicines only as directed by your health care provider.  If you were prescribed either an antibiotic or antifungal medicine, finish it all even if you start to feel better. SEEK IMMEDIATE MEDICAL CARE IF:  You have increasing pain or severe headaches.  You have nausea, vomiting, or drowsiness.  You have swelling around your face.  You have vision problems.  You have a stiff neck.  You have difficulty breathing.   This information is not intended to replace advice given to you by your health care provider. Make sure you discuss any questions you have with your health care provider.   Document Released: 10/10/2005 Document Revised: 10/31/2014 Document Reviewed: 10/25/2011 Elsevier Interactive Patient Education [employer].

## 2015-11-04 NOTE — ED Provider Notes (Signed)
[facility] Emergency Department Provider Note ____________________________________________  Time seen: 2040  I have reviewed the triage vital signs and the nursing notes.  HISTORY  Chief Complaint  Shortness of Breath  HPI Ana Knapp is a 35 y.o. female presents to the ED for evaluation of some shortness of breath that has been intermittent for the last3-4 days. She was evaluated this afternoon at [facility] for cough and wheezing. In that timeframe she is started on a prednisone taper pack by her primary ENT provider. She does recall similar symptoms including chest tightness, shortness of breath, heart racing, insomnia, anger, dizziness, and sweats when she dosed prednisone in the past. She has experienced all the symptoms in the last 3-4 days. She's also been started on a new prescriptions for Flovent and albuterol as needed. She reports use of the albuterol when she has the onset of the chest tightness and shortness of breath. The most recent episode occurred last night while she was asleep. She also notes some prolonged jitters following use of the albuterol inhaler. She denies any interim fevers, chills, or sweats. Her last dose of prednisone was this afternoon at lunch time. She is on day 3 of a 6 day taper.  Past Medical History  Diagnosis Date  . Chronic sinusitis   . Fibromyalgia   . Asthma     Patient Active Problem List   Diagnosis Date Noted  . Asthma exacerbation attacks 11/04/2015  . Sinus problem 10/13/2015  . Abdominal pain, chronic, epigastric 09/28/2015  . GERD (gastroesophageal reflux disease) 09/28/2015  . Oral contraceptive prescribed 09/28/2015  . Flushing 09/24/2015  . Preventative health care 08/30/2015  . Fibromyalgia 08/30/2015  . Seasonal allergic rhinitis 08/30/2015  . Hair loss 08/30/2015    No past surgical history on file.  Current Outpatient Rx  Name  Route  Sig  Dispense  Refill  . albuterol (PROVENTIL  HFA;VENTOLIN HFA) 108 (90 Base) MCG/ACT inhaler   Inhalation   Inhale 2 puffs into the lungs every 6 (six) hours as needed for wheezing or shortness of breath.         . fluticasone (FLONASE) 50 MCG/ACT nasal spray   Each Nare   Place 2 sprays into both nostrils daily.         Marland Kitchen guaiFENesin-codeine 100-10 MG/5ML syrup   Oral   Take 5 mLs by mouth 3 (three) times daily as needed for cough.   75 mL   0   . levofloxacin (LEVAQUIN) 750 MG tablet   Oral   Take 1 tablet (750 mg total) by mouth daily.   5 tablet   0   . montelukast (SINGULAIR) 10 MG tablet   Oral   Take 1 tablet (10 mg total) by mouth at bedtime.   30 tablet   3   . Norgestimate-Ethinyl Estradiol Triphasic (ORTHO TRI-CYCLEN, 28,) 0.18/0.215/0.25 MG-35 MCG tablet   Oral   Take 1 tablet by mouth daily.   1 Package   11   . pantoprazole (PROTONIX) 40 MG tablet   Oral   Take 1 tablet (40 mg total) by mouth daily.   30 tablet   2   . predniSONE (DELTASONE) 10 MG tablet   Oral   Take 10 mg by mouth daily with breakfast.         . sucralfate (CARAFATE) 1 GM/10ML suspension   Oral   Take 10 mLs (1 g total) by mouth 4 (four) times daily -  with meals and  at bedtime.   420 mL   0   . traZODone (DESYREL) 50 MG tablet   Oral   Take 1 tablet (50 mg total) by mouth at bedtime as needed for sleep.   30 tablet   3    Allergies Augmentin  Family History  Problem Relation Age of Onset  . Thyroid disease Mother   . Diabetes Mother   . Cancer Father     bladder  . Hypertension Maternal Grandmother   . Thyroid disease Paternal Grandmother   . Diabetes Paternal Grandmother   . Stroke Paternal Grandmother   . Cancer Paternal Grandfather     skin  . Heart disease Neg Hx   . COPD Neg Hx     Social History Social History  Substance Use Topics  . Smoking status: Never Smoker   . Smokeless tobacco: Never Used  . Alcohol Use: No     Comment: rare   Review of Systems  Constitutional: Negative for  fever. Eyes: Negative for visual changes. ENT: Negative for sore throat. Cardiovascular: Negative for chest pain. Respiratory: Negative for shortness of breath. Gastrointestinal: Negative for abdominal pain, vomiting and diarrhea. Genitourinary: Negative for dysuria. Musculoskeletal: Negative for back pain. Skin: Negative for rash. Neurological: Negative for headaches, focal weakness or numbness. ____________________________________________  PHYSICAL EXAM:  VITAL SIGNS: ED Triage Vitals  Enc Vitals Group     BP 11/04/15 1937 122/54 mmHg     Pulse Rate 11/04/15 1937 99     Resp 11/04/15 1937 22     Temp 11/04/15 1937 98.1 F (36.7 C)     Temp Source 11/04/15 1937 Oral     SpO2 11/04/15 1937 100 %     Weight 11/04/15 1937 153 lb (69.4 kg)     Height 11/04/15 1937 5\' 9"  (1.753 m)     Head Cir --      Peak Flow --      Pain Score 11/04/15 2009 1     Pain Loc --      Pain Edu? --      Excl. in [place]? --    Constitutional: Alert and oriented. Well appearing and in no distress. Head: Normocephalic and atraumatic.      Eyes: Conjunctivae are normal. PERRL. Normal extraocular movements      Ears: Canals clear. TMs intact bilaterally.   Nose: No congestion/rhinorrhea.   Mouth/Throat: Mucous membranes are moist.   Neck: Supple. No thyromegaly. Hematological/Lymphatic/Immunological: No cervical lymphadenopathy. Cardiovascular: Normal rate, regular rhythm.  Respiratory: Normal respiratory effort. No wheezes/rales/rhonchi. Gastrointestinal: Soft and nontender. No distention. Musculoskeletal: Nontender with normal range of motion in all extremities.  Neurologic:  Normal gait without ataxia. Normal speech and language. No gross focal neurologic deficits are appreciated. Skin:  Skin is warm, dry and intact. No rash noted. Psychiatric: Mood and affect are normal. Patient exhibits appropriate insight and judgment. ____________________________________________  INITIAL IMPRESSION /  ASSESSMENT AND PLAN / ED COURSE  Patient with multiple complaints are likely consistent with steroid side effects. She is encouraged to discontinue the prednisone as previous prescribed. She will continue with her albuterol inhaler as needed for symptom relief. We discussed at length the possibility that she was likely treating shortness of breath or chest tightness with albuterol that was likely side effect of the prednisone. She will continue her other medications as prescribed and follow-up with primary care provider as scheduled. ____________________________________________  FINAL CLINICAL IMPRESSION(S) / ED DIAGNOSES  Final diagnoses:  URI (upper respiratory infection)  Bronchospasm  Prednisone adverse reaction, initial encounter      Melvenia Needles, PA-C 11/04/15 2236  Lisa Roca, MD 11/04/15 2340

## 2015-11-04 NOTE — Telephone Encounter (Signed)
She says it felt like a sinus infection, then went down into chest; she saw ENT; he told her she was wheezing in her lungs and she has a touch of asthma; he gave her inhalers; it has some nasty side effects to it; she tried to get another one yesterday at the pharmacy; the one ENT sent; she has the steroid inhaler in was not covered on her insurance; she is on prednisone too; she talked to ENT a few days ago to let them know that her inhaler caused side effects like nausea and dizziness, bad headache; she has not had a CXR; no breathing test at ENT; she has not brought back the 5-HIAA urine testing back so I encouraged her to get that urine specimen collected ASAP; ENT testing for allergies; she will go to [facility] now since she is having trouble breathing; she can get CXR, spirometry there; most likely asthma, but I feel compelled to r/o carcinoid tumor New orders entered, because I don't think the last one was marked future or linked to a lab visit

## 2015-11-04 NOTE — ED Notes (Signed)
Patient c/o SOB and cough for a week.  Patient denies fevers.  Patient started an albuterol inhaler a week ago.

## 2015-11-04 NOTE — Discharge Instructions (Signed)
Bronchospasm, Adult A bronchospasm is a spasm or tightening of the airways going into the lungs. During a bronchospasm breathing becomes more difficult because the airways get smaller. When this happens there can be coughing, a whistling sound when breathing (wheezing), and difficulty breathing. Bronchospasm is often associated with asthma, but not all patients who experience a bronchospasm have asthma. CAUSES  A bronchospasm is caused by inflammation or irritation of the airways. The inflammation or irritation may be triggered by:   Allergies (such as to animals, pollen, food, or mold). Allergens that cause bronchospasm may cause wheezing immediately after exposure or many hours later.   Infection. Viral infections are believed to be the most common cause of bronchospasm.   Exercise.   Irritants (such as pollution, cigarette smoke, strong odors, aerosol sprays, and paint fumes).   Weather changes. Winds increase molds and pollens in the air. Rain refreshes the air by washing irritants out. Cold air may cause inflammation.   Stress and emotional upset.  SIGNS AND SYMPTOMS   Wheezing.   Excessive nighttime coughing.   Frequent or severe coughing with a simple cold.   Chest tightness.   Shortness of breath.  DIAGNOSIS  Bronchospasm is usually diagnosed through a history and physical exam. Tests, such as chest X-rays, are sometimes done to look for other conditions. TREATMENT   Inhaled medicines can be given to open up your airways and help you breathe. The medicines can be given using either an inhaler or a nebulizer machine.  Corticosteroid medicines may be given for severe bronchospasm, usually when it is associated with asthma. HOME CARE INSTRUCTIONS   Always have a plan prepared for seeking medical care. Know when to call your health care provider and local emergency services (911 in the [place] Know where you can access local emergency care.  Only take medicines as  directed by your health care provider.  If you were prescribed an inhaler or nebulizer machine, ask your health care provider to explain how to use it correctly. Always use a spacer with your inhaler if you were given one.  It is necessary to remain calm during an attack. Try to relax and breathe more slowly.  Control your home environment in the following ways:   Change your heating and air conditioning filter at least once a month.   Limit your use of fireplaces and wood stoves.  Do not smoke and do not allow smoking in your home.   Avoid exposure to perfumes and fragrances.   Get rid of pests (such as roaches and mice) and their droppings.   Throw away plants if you see mold on them.   Keep your house clean and dust free.   Replace carpet with wood, tile, or vinyl flooring. Carpet can trap dander and dust.   Use allergy-proof pillows, mattress covers, and box spring covers.   Wash bed sheets and blankets every week in hot water and dry them in a dryer.   Use blankets that are made of polyester or cotton.   Wash hands frequently. SEEK MEDICAL CARE IF:   You have muscle aches.   You have chest pain.   The sputum changes from clear or white to yellow, green, gray, or bloody.   The sputum you cough up gets thicker.   There are problems that may be related to the medicine you are given, such as a rash, itching, swelling, or trouble breathing.  SEEK IMMEDIATE MEDICAL CARE IF:   You have worsening wheezing and coughing  even after taking your prescribed medicines.   You have increased difficulty breathing.   You develop severe chest pain. MAKE SURE YOU:   Understand these instructions.  Will watch your condition.  Will get help right away if you are not doing well or get worse.   This information is not intended to replace advice given to you by your health care provider. Make sure you discuss any questions you have with your health care  provider.   Document Released: 10/13/2003 Document Revised: 10/31/2014 Document Reviewed: 04/01/2013 Elsevier Interactive Patient Education 2016 Else[employer]   Upper Respiratory Infection, Adult Most upper respiratory infections (URIs) are caused by a virus. A URI affects the nose, throat, and upper air passages. The most common type of URI is often called "the common cold." HOME CARE   Take medicines only as told by your doctor.  Gargle warm saltwater or take cough drops to comfort your throat as told by your doctor.  Use a warm mist humidifier or inhale steam from a shower to increase air moisture. This may make it easier to breathe.  Drink enough fluid to keep your pee (urine) clear or pale yellow.  Eat soups and other clear broths.  Have a healthy diet.  Rest as needed.  Go back to work when your fever is gone or your doctor says it is okay.  You may need to stay home longer to avoid giving your URI to others.  You can also wear a face mask and wash your hands often to prevent spread of the virus.  Use your inhaler more if you have asthma.  Do not use any tobacco products, including cigarettes, chewing tobacco, or electronic cigarettes. If you need help quitting, ask your doctor. GET HELP IF:  You are getting worse, not better.  Your symptoms are not helped by medicine.  You have chills.  You are getting more short of breath.  You have brown or red mucus.  You have yellow or brown discharge from your nose.  You have pain in your face, especially when you bend forward.  You have a fever.  You have puffy (swollen) neck glands.  You have pain while swallowing.  You have white areas in the back of your throat. GET HELP RIGHT AWAY IF:   You have very bad or constant:  Headache.  Ear pain.  Pain in your forehead, behind your eyes, and over your cheekbones (sinus pain).  Chest pain.  You have long-lasting (chronic) lung disease and any of the  following:  Wheezing.  Long-lasting cough.  Coughing up blood.  A change in your usual mucus.  You have a stiff neck.  You have changes in your:  Vision.  Hearing.  Thinking.  Mood. MAKE SURE YOU:   Understand these instructions.  Will watch your condition.  Will get help right away if you are not doing well or get worse.   This information is not intended to replace advice given to you by your health care provider. Make sure you discuss any questions you have with your health care provider.   Document Released: 03/28/2008 Document Revised: 02/24/2015 Document Reviewed: 01/15/2014 Elsevier Interactive Patient Education [employer].  Your exam, and EKG are normal today. Your symptoms appear to be related to common steroid reactions. You should stop the prednisone at this time. Continue your other medications as discussed. Follow-up with your provider for ongoing symptoms.

## 2015-11-05 ENCOUNTER — Encounter: Payer: Self-pay | Admitting: Family Medicine

## 2015-11-05 ENCOUNTER — Other Ambulatory Visit: Payer: BLUE CROSS/BLUE SHIELD

## 2015-11-05 DIAGNOSIS — R232 Flushing: Secondary | ICD-10-CM

## 2015-11-05 NOTE — Telephone Encounter (Signed)
Routing to provider  

## 2015-11-10 LAB — CHROMOGRANIN A: CHROMOGRANIN A: 2 nmol/L (ref 0–5)

## 2015-11-12 ENCOUNTER — Emergency Department
Admission: EM | Admit: 2015-11-12 | Discharge: 2015-11-12 | Payer: BLUE CROSS/BLUE SHIELD | Attending: Emergency Medicine | Admitting: Emergency Medicine

## 2015-11-12 ENCOUNTER — Encounter: Payer: Self-pay | Admitting: Family Medicine

## 2015-11-12 ENCOUNTER — Ambulatory Visit (INDEPENDENT_AMBULATORY_CARE_PROVIDER_SITE_OTHER)
Admission: EM | Admit: 2015-11-12 | Discharge: 2015-11-12 | Disposition: A | Discharge status: Home / Self Care | Payer: BLUE CROSS/BLUE SHIELD | Source: Home / Self Care | Attending: Family Medicine | Admitting: Family Medicine

## 2015-11-12 ENCOUNTER — Emergency Department: Payer: BLUE CROSS/BLUE SHIELD

## 2015-11-12 ENCOUNTER — Encounter: Payer: Self-pay | Admitting: Emergency Medicine

## 2015-11-12 DIAGNOSIS — R600 Localized edema: Secondary | ICD-10-CM | POA: Insufficient documentation

## 2015-11-12 DIAGNOSIS — Z7952 Long term (current) use of systemic steroids: Secondary | ICD-10-CM | POA: Diagnosis not present

## 2015-11-12 DIAGNOSIS — I2699 Other pulmonary embolism without acute cor pulmonale: Secondary | ICD-10-CM | POA: Diagnosis not present

## 2015-11-12 DIAGNOSIS — Z79899 Other long term (current) drug therapy: Secondary | ICD-10-CM | POA: Insufficient documentation

## 2015-11-12 DIAGNOSIS — R000 Tachycardia, unspecified: Secondary | ICD-10-CM | POA: Diagnosis not present

## 2015-11-12 DIAGNOSIS — Z7951 Long term (current) use of inhaled steroids: Secondary | ICD-10-CM | POA: Diagnosis not present

## 2015-11-12 DIAGNOSIS — J45901 Unspecified asthma with (acute) exacerbation: Secondary | ICD-10-CM | POA: Diagnosis not present

## 2015-11-12 DIAGNOSIS — F419 Anxiety disorder, unspecified: Secondary | ICD-10-CM | POA: Insufficient documentation

## 2015-11-12 DIAGNOSIS — Z791 Long term (current) use of non-steroidal anti-inflammatories (NSAID): Secondary | ICD-10-CM | POA: Diagnosis not present

## 2015-11-12 DIAGNOSIS — R2242 Localized swelling, mass and lump, left lower limb: Secondary | ICD-10-CM | POA: Diagnosis present

## 2015-11-12 DIAGNOSIS — M79605 Pain in left leg: Secondary | ICD-10-CM | POA: Diagnosis not present

## 2015-11-12 DIAGNOSIS — R609 Edema, unspecified: Secondary | ICD-10-CM

## 2015-11-12 DIAGNOSIS — Z3202 Encounter for pregnancy test, result negative: Secondary | ICD-10-CM | POA: Diagnosis not present

## 2015-11-12 DIAGNOSIS — I82429 Acute embolism and thrombosis of unspecified iliac vein: Secondary | ICD-10-CM

## 2015-11-12 DIAGNOSIS — M7989 Other specified soft tissue disorders: Secondary | ICD-10-CM

## 2015-11-12 LAB — POCT PREGNANCY, URINE: Preg Test, Ur: NEGATIVE

## 2015-11-12 LAB — APTT: APTT: 32 s (ref 24–36)

## 2015-11-12 LAB — BASIC METABOLIC PANEL
ANION GAP: 9 (ref 5–15)
BUN: 13 mg/dL (ref 6–20)
CALCIUM: 9.2 mg/dL (ref 8.9–10.3)
CO2: 26 mmol/L (ref 22–32)
Chloride: 99 mmol/L — ABNORMAL LOW (ref 101–111)
Creatinine, Ser: 0.83 mg/dL (ref 0.44–1.00)
GFR calc Af Amer: >60 mL/min (ref >60)
GFR calc non Af Amer: >60 mL/min (ref >60)
GLUCOSE: 102 mg/dL — AB (ref 65–99)
POTASSIUM: 3.9 mmol/L (ref 3.5–5.1)
Sodium: 134 mmol/L — ABNORMAL LOW (ref 135–145)

## 2015-11-12 LAB — HCG, QUANTITATIVE, PREGNANCY: hCG, Beta Chain, Quant, S: <1 m[IU]/mL (ref <5)

## 2015-11-12 LAB — CBC
HCT: 38.2 % (ref 35.0–47.0)
Hemoglobin: 12.5 g/dL (ref 12.0–16.0)
MCH: 26.8 pg (ref 26.0–34.0)
MCHC: 32.7 g/dL (ref 32.0–36.0)
MCV: 82 fL (ref 80.0–100.0)
PLATELETS: 188 10*3/uL (ref 150–440)
RBC: 4.66 MIL/uL (ref 3.80–5.20)
RDW: 13 % (ref 11.5–14.5)
WBC: 16.1 10*3/uL — AB (ref 3.6–11.0)

## 2015-11-12 LAB — FIBRIN DERIVATIVES D-DIMER (ARMC ONLY)

## 2015-11-12 LAB — PROTIME-INR
INR: 1.16
Prothrombin Time: 15 seconds (ref 11.4–15.0)

## 2015-11-12 MED ORDER — SODIUM CHLORIDE 0.9 % IV SOLN
Freq: Once | INTRAVENOUS | Status: AC
  Administered 2015-11-12: 15:00:00 via INTRAVENOUS

## 2015-11-12 MED ORDER — ENOXAPARIN SODIUM 80 MG/0.8ML ~~LOC~~ SOLN
1.0000 mg/kg | Freq: Once | SUBCUTANEOUS | Status: AC
Start: 2015-11-12 — End: 2015-11-12
  Administered 2015-11-12: 70 mg via SUBCUTANEOUS
  Filled 2015-11-12: qty 0.8

## 2015-11-12 MED ORDER — ENOXAPARIN SODIUM 100 MG/ML ~~LOC~~ SOLN
SUBCUTANEOUS | Status: AC
  Administered 2015-11-12: 70 mg via SUBCUTANEOUS
  Filled 2015-11-12: qty 1

## 2015-11-12 MED ORDER — IOHEXOL 350 MG/ML SOLN
75.0000 mL | Freq: Once | INTRAVENOUS | Status: AC | PRN
  Administered 2015-11-12: 75 mL via INTRAVENOUS

## 2015-11-12 MED ORDER — ACETAMINOPHEN 325 MG PO TABS
650.0000 mg | ORAL_TABLET | Freq: Once | ORAL | Status: AC
  Administered 2015-11-12: 650 mg via ORAL
  Filled 2015-11-12: qty 2

## 2015-11-12 NOTE — ED Notes (Signed)
Dx with Costochondritis 11/09/15 and put on Meloxicam. Just started 2nd pack of new BCP. + swelling of left groin down to left foot. Very painful to bear weight. Right calf measures  14 1/4 inches. Left calf measures 16 inches. Right mid thigh measures 19 inches and Left mid thigh measures 22 inches

## 2015-11-12 NOTE — ED Provider Notes (Signed)
[facility]  ____________________________________________  Time seen: Approximately 11:12 AM  I have reviewed the triage vital signs and the nursing notes.   HISTORY  Chief Complaint Leg Swelling   HPI Ana Knapp is a 35 y.o. female presents with a complaint of left lower leg pain and swelling x 2 days. Patient reports that this is been a gradual onset over the last 2 days. States no leg swelling prior to last 2 days. Patient reports that she has pain to touch as well as pain when walking and flexing her foot. Patient states that her entire left leg is painful and swollen at 5/10. Patient does report that she has had a recent sinus infection as well as bronchitis and states that she has been very run down with her recent sickness and has not been as active as normal. Patient also reports that she has recently started a new oral birth control pill which she is currently on her second month of.  Patient reports that she has been seen in ER and urgent cares recently for bronchitis. Patient states that she went to the urgent care earlier this week and was diagnosed with costochondritis. Patient states states that occasionally she has some chest pain and shortness of breath with coughing as well as deep breaths. Denies current chest pain or shortness of breath. Patient does report that occasionally she will have some mid chest discomfort with deep breath and states that it does hurt to touch her chest but the pain does not equal the same as what she feels with deep breath. Patient reports wheezing, chest tightness, shortness of breath, and cough have almost fully resolved from recent bronchitis.   Denies any fall or injury. Denies any recent trips. Denies known fevers but reports intermittent warmth and chills. Patient reports that when she was recently in the ER was determined that she is sensitive to prednisone and does not tolerate well.  Denies current chest pain or shortness of  breath. Denies dizziness, syncope, presyncope, palpitations, anxiety, rash or trauma.   Past Medical History  Diagnosis Date  . Chronic sinusitis   . Fibromyalgia   . Asthma     Patient Active Problem List   Diagnosis Date Noted  . Asthma exacerbation attacks 11/04/2015  . Sinus problem 10/13/2015  . Abdominal pain, chronic, epigastric 09/28/2015  . GERD (gastroesophageal reflux disease) 09/28/2015  . Oral contraceptive prescribed 09/28/2015  . Flushing 09/24/2015  . Preventative health care 08/30/2015  . Fibromyalgia 08/30/2015  . Seasonal allergic rhinitis 08/30/2015  . Hair loss 08/30/2015    History reviewed. No pertinent past surgical history.  Current Outpatient Rx  Name  Route  Sig  Dispense  Refill  . albuterol (PROVENTIL HFA;VENTOLIN HFA) 108 (90 Base) MCG/ACT inhaler   Inhalation   Inhale 2 puffs into the lungs every 6 (six) hours as needed for wheezing or shortness of breath.         . fluticasone (FLONASE) 50 MCG/ACT nasal spray   Each Nare   Place 2 sprays into both nostrils daily.         . montelukast (SINGULAIR) 10 MG tablet   Oral   Take 1 tablet (10 mg total) by mouth at bedtime.   30 tablet   3   . Norgestimate-Ethinyl Estradiol Triphasic (ORTHO TRI-CYCLEN, 28,) 0.18/0.215/0.25 MG-35 MCG tablet   Oral   Take 1 tablet by mouth daily.   1 Package   11   . pantoprazole (PROTONIX) 40 MG tablet  Oral   Take 1 tablet (40 mg total) by mouth daily.   30 tablet   2   .           .           . sucralfate (CARAFATE) 1 GM/10ML suspension   Oral   Take 10 mLs (1 g total) by mouth 4 (four) times daily -  with meals and at bedtime.   420 mL   0   . traZODone (DESYREL) 50 MG tablet   Oral   Take 1 tablet (50 mg total) by mouth at bedtime as needed for sleep.   30 tablet   3     Allergies Prednisone and Augmentin  Family History  Problem Relation Age of Onset  . Thyroid disease Mother   . Diabetes Mother   . Cancer Father      bladder  . Hypertension Maternal Grandmother   . Thyroid disease Paternal Grandmother   . Diabetes Paternal Grandmother   . Stroke Paternal Grandmother   . Cancer Paternal Grandfather     skin  . Heart disease Neg Hx   . COPD Neg Hx     Social History Social History  Substance Use Topics  . Smoking status: Never Smoker   . Smokeless tobacco: Never Used  . Alcohol Use: No     Comment: rare    Family history Grandmother: DVTs Denies family cardiac history  Review of Systems Constitutional: occasional chills Eyes: No visual changes. ENT: No sore throat. Cardiovascular: Denies chest pain. Respiratory: Denies shortness of breath. Gastrointestinal: No abdominal pain.  No nausea, no vomiting.  No diarrhea.  No constipation. Genitourinary: Negative for dysuria. Musculoskeletal: Negative for back pain. Left leg swelling.  Skin: Negative for rash. Neurological: Negative for headaches, focal weakness or numbness.  10-point ROS otherwise negative.  ____________________________________________   PHYSICAL EXAM:  VITAL SIGNS: ED Triage Vitals  Enc Vitals Group     BP 11/12/15 1050 113/60 mmHg     Pulse Rate 11/12/15 1050 108     Resp 11/12/15 1050 16     Temp 11/12/15 1050 99.9 F (37.7 C)     Temp Source 11/12/15 1050 Tympanic     SpO2 11/12/15 1050 99 %     Weight 11/12/15 1050 153 lb (69.4 kg)     Height 11/12/15 1050 5\' 9"  (1.753 m)     Head Cir --      Peak Flow --      Pain Score 11/12/15 1054 7     Pain Loc --      Pain Edu? --      Excl. in [place]? --     Constitutional: Alert and oriented. Well appearing and in no acute distress. Eyes: Conjunctivae are normal. PERRL. EOMI. Head: Atraumatic.  Ears: no erythema, normal TMs bilaterally.   Nose: No congestion/rhinnorhea.  Mouth/Throat: Mucous membranes are moist.  Oropharynx non-erythematous. Neck: No stridor.  No cervical spine tenderness to palpation. Hematological/Lymphatic/Immunilogical: No cervical  lymphadenopathy. Cardiovascular: Normal rate, regular rhythm. Grossly normal heart sounds.  Good peripheral circulation. Midsternal and just adjacent to lower left midsternum mild TTP, no swelling or ecchymosis.  Respiratory: Normal respiratory effort.  No retractions. Lungs CTAB.  Intermittent dry cough. No wheezes, rales or rhonchi. Gastrointestinal: Soft and nontender. No distention. Normal Bowel sounds.  No abdominal bruits. No CVA tenderness. Musculoskeletal: Right lower extremity nontender and no edema. Bilateral upper extremities nontender with no swelling. Left lower extremity diffuse swelling to entire  left lower extremity, no erythema, no ecchymosis, no fluctuance, no induration, distal pedal pulses verified with Doppler, moderate tenderness to palpation left posterior calf and posterior knee, left lower extremity mildly diffusely tender to palpation. Left lower extremity full range of motion. Lower extremity sensation intact. Neurologic:  Normal speech and language. No gross focal neurologic deficits are appreciated.  Skin:  Skin is warm, dry and intact. No rash noted. Psychiatric: Mood and affect are normal. Speech and behavior are normal.  ____________________________________________   LABS (all labs ordered are listed, but only abnormal results are displayed)  Labs Reviewed - No data to display  INITIAL IMPRESSION / ASSESSMENT AND PLAN / ED COURSE  Pertinent labs & imaging results that were available during my care of the patient were reviewed by me and considered in my medical decision making (see chart for details).  Well-appearing patient no acute distress. However patient with marked left lower extremity edema that is diffuse. RN measured legs with right calf measuring 14-1/4 inches versus left calf measuring 16 inches; Also right mid thigh measuring 19 inches and left mid thigh measuring 22 inches. Hand-held Doppler utilized to better verify left lower extremity pulses. Faint  dorsal pedis pulse to left lower extremity, strong and equal bilaterally posterior tibialis pulse. As patient with recent sickness, recent sedentary changes, oral birth control, current tachycardia and low-grade fever as well as chest discomfort intermittently and left extremity swelling, discussed with patient need to be further evaluated in emergency room of her choice. Patient alert and oriented with decisional capacity and states that she prefers to go by private vehicle. Refused EMS transport. Patient fianc to transport her directly to ER. Patient stable at the time of discharge and transfer. Concern for DVT and PE. Patient states she will go to [facility]. Directed no ambulation, to use wheelchair.   Reports called to [place] first Nurse at [facility].   Discussed follow up with Primary care physician this week. Discussed follow up and return parameters including no resolution or any worsening concerns. Patient verbalized understanding and agreed to plan.   ____________________________________________   FINAL CLINICAL IMPRESSION(S) / ED DIAGNOSES  Final diagnoses:  Left leg swelling  Left leg pain      Note: This dictation was prepared with Dragon dictation along with smaller phrase technology. Any transcriptional errors that result from this process are unintentional.     Marylene Land, NP 11/12/15 [place], NP 11/12/15 [phone]

## 2015-11-12 NOTE — ED Notes (Signed)
Patient complaining of left leg swelling since Tues. Denies any known injury. Recently switched BCP - this is the second month. Denies smoking. Pain behind left knee when she walks. Seen at Urgent Care and sent to ED. Recent history of bronchitis. Left leg is edematous from groin to toe, warm to touch, + DP  Denies SHOB at this time.

## 2015-11-12 NOTE — ED Notes (Signed)
Patient and Fiance updated that we are waiting to here from [facility] for transfer

## 2015-11-12 NOTE — ED Provider Notes (Addendum)
[facility] Emergency Department Provider Note  ____________________________________________   I have reviewed the triage vital signs and the nursing notes.   HISTORY  Chief Complaint Leg Swelling    HPI Ana Knapp is a 35 y.o. female   with a history of blood clots in her family, on birth control pills denies pregnancy, otherwise healthy patient states that she has had chest pain and slight cough for the last 2-3 days and left lower shortly swelling for last 2 days. She has had no fevers. The chest pain is bilateral in her back. She does feel short of breath. She was told she may have a bronchitic process. Patient states that her leg hurts a great deal was very difficult to walk on.  Past Medical History  Diagnosis Date  . Chronic sinusitis   . Fibromyalgia   . Asthma     Patient Active Problem List   Diagnosis Date Noted  . Asthma exacerbation attacks 11/04/2015  . Sinus problem 10/13/2015  . Abdominal pain, chronic, epigastric 09/28/2015  . GERD (gastroesophageal reflux disease) 09/28/2015  . Oral contraceptive prescribed 09/28/2015  . Flushing 09/24/2015  . Preventative health care 08/30/2015  . Fibromyalgia 08/30/2015  . Seasonal allergic rhinitis 08/30/2015  . Hair loss 08/30/2015    History reviewed. No pertinent past surgical history.  Current Outpatient Rx  Name  Route  Sig  Dispense  Refill  . meloxicam (MOBIC) 7.5 MG tablet   Oral   Take 7.5 mg by mouth daily.         Marland Kitchen albuterol (PROVENTIL HFA;VENTOLIN HFA) 108 (90 Base) MCG/ACT inhaler   Inhalation   Inhale 2 puffs into the lungs every 6 (six) hours as needed for wheezing or shortness of breath.         . fluticasone (FLONASE) 50 MCG/ACT nasal spray   Each Nare   Place 2 sprays into both nostrils daily.         Marland Kitchen guaiFENesin-codeine 100-10 MG/5ML syrup   Oral   Take 5 mLs by mouth 3 (three) times daily as needed for cough.   75 mL   0   . montelukast (SINGULAIR)  10 MG tablet   Oral   Take 1 tablet (10 mg total) by mouth at bedtime.   30 tablet   3   . Norgestimate-Ethinyl Estradiol Triphasic (ORTHO TRI-CYCLEN, 28,) 0.18/0.215/0.25 MG-35 MCG tablet   Oral   Take 1 tablet by mouth daily.   1 Package   11   . pantoprazole (PROTONIX) 40 MG tablet   Oral   Take 1 tablet (40 mg total) by mouth daily.   30 tablet   2   . predniSONE (DELTASONE) 10 MG tablet   Oral   Take 10 mg by mouth daily with breakfast.         . sucralfate (CARAFATE) 1 GM/10ML suspension   Oral   Take 10 mLs (1 g total) by mouth 4 (four) times daily -  with meals and at bedtime.   420 mL   0   . traZODone (DESYREL) 50 MG tablet   Oral   Take 1 tablet (50 mg total) by mouth at bedtime as needed for sleep.   30 tablet   3     Allergies Prednisone and Augmentin  Family History  Problem Relation Age of Onset  . Thyroid disease Mother   . Diabetes Mother   . Cancer Father     bladder  . Hypertension Maternal Grandmother   .  Thyroid disease Paternal Grandmother   . Diabetes Paternal Grandmother   . Stroke Paternal Grandmother   . Cancer Paternal Grandfather     skin  . Heart disease Neg Hx   . COPD Neg Hx     Social History Social History  Substance Use Topics  . Smoking status: Never Smoker   . Smokeless tobacco: Never Used  . Alcohol Use: No     Comment: rare    Review of Systems Constitutional: No fever/chills Eyes: No visual changes. ENT: No sore throat. No stiff neck no neck pain Cardiovascular:  + pleuritic chest pain. Respiratory: + shortness of breath. Gastrointestinal:   no vomiting.  No diarrhea.  No constipation. Genitourinary: Negative for dysuria. Musculoskeletal: Positive lower extremity swelling Skin: Negative for rash. Neurological: Negative for headaches, focal weakness or numbness. 10-point ROS otherwise negative.  ____________________________________________   PHYSICAL EXAM:  VITAL SIGNS: ED Triage Vitals  Enc  Vitals Group     BP 11/12/15 1203 112/57 mmHg     Pulse Rate 11/12/15 1203 116     Resp 11/12/15 1203 16     Temp 11/12/15 1203 98.7 F (37.1 C)     Temp Source 11/12/15 1203 Oral     SpO2 11/12/15 1203 100 %     Weight --      Height --      Head Cir --      Peak Flow --      Pain Score 11/12/15 1204 7     Pain Loc --      Pain Edu? --      Excl. in [place]? --     Constitutional: Alert and oriented. Well appearing and in no acute distress. Anxious Eyes: Conjunctivae are normal. PERRL. EOMI. Head: Atraumatic. Nose: No congestion/rhinnorhea. Mouth/Throat: Mucous membranes are moist.  Oropharynx non-erythematous. Neck: No stridor.   Nontender with no meningismus Cardiovascular: Tachycardia noted regular rhythm. Grossly normal heart sounds.  Good peripheral circulation. Respiratory: Normal respiratory effort.  No retractions. Lungs CTAB. Abdominal: Soft and nontender. No distention. No guarding no rebound Back:  There is no focal tenderness or step off there is no midline tenderness there are no lesions noted. there is no CVA tenderness Musculoskeletal: No right lower extremity tenderness. LLE is diffusely tender. It is warm. Pulses are appreciated distally. There is swelling the length of the leg with + calf pain and + homan sign. Pulses are present and perhaps slightly less on the left than the right Neurologic:  Normal speech and language. No gross focal neurologic deficits are appreciated.  Skin:  Skin is warm, dry and intact. No rash noted. Psychiatric: Mood and affect are anxious. Speech and behavior are normal.  ____________________________________________   LABS (all labs ordered are listed, but only abnormal results are displayed)  Labs Reviewed  BASIC METABOLIC PANEL - Abnormal; Notable for the following:    Sodium 134 (*)    Chloride 99 (*)    Glucose, Bld 102 (*)    All other components within normal limits  CBC - Abnormal; Notable for the following:    WBC 16.1 (*)     All other components within normal limits  FIBRIN DERIVATIVES D-DIMER ([facility] ONLY) - Abnormal; Notable for the following:    Fibrin derivatives D-dimer ([facility]) >7500 (*)    All other components within normal limits  APTT  PROTIME-INR  HCG, QUANTITATIVE, PREGNANCY  FIBRIN DEGRADATION PROD.([facility] ONLY)  POCT PREGNANCY, URINE  POC URINE PREG, ED   ____________________________________________  EKG  I personally interpreted any EKGs ordered by me or triage Sinus tachycardia rate 113 no acute ST elevation, nonspecific ST changes noted, normal axis. ____________________________________________  [id number]  I reviewed any imaging ordered by me or triage that were performed during my shift ____________________________________________   PROCEDURES  Procedure(s) performed: None  Critical Care performed: CRITICAL CARE Performed by: Schuyler Amor   Total critical care time: 40 minutes  Critical care time was exclusive of separately billable procedures and treating other patients.  Critical care was necessary to treat or prevent imminent or life-threatening deterioration.  Critical care was time spent personally by me on the following activities: development of treatment plan with patient and/or surrogate as well as nursing, discussions with consultants, evaluation of patient's response to treatment, examination of patient, obtaining history from patient or surrogate, ordering and performing treatments and interventions, ordering and review of laboratory studies, ordering and review of radiographic studies, pulse oximetry and re-evaluation of patient's condition.   ____________________________________________   INITIAL IMPRESSION / ASSESSMENT AND PLAN / ED COURSE  Pertinent labs & imaging results that were available during my care of the patient were reviewed by me and considered in my medical decision making (see chart for details).  Pt with no counter indication to  anticoagulation presents with what appears to be a dvt and possible PE.  Results were just called back to me of positive chest ct.  We are giving lovenox im as an initial dose. There is no evidence of right heart strain on ct.  I am calling [facility] as I think pt may benefit, given extensive clot burden from extraction of the leg clot.  We do not do that here I am informed.   ----------------------------------------- 3:08 PM on 11/12/2015 -----------------------------------------  Have discussed with Dr. Glendell Docker at [facility] vascular surgery who feels this patient would be amenable to interventional thrombectomy. She agrees with anticoagulation which is Artie been given. We are waiting a callback from the emergency room at [facility] for ED to ED transferred. Patient remains stable in the emergency room with mild tachycardia.   ____________________________________________   FINAL CLINICAL IMPRESSION(S) / ED DIAGNOSES  Final diagnoses:  Edema     Schuyler Amor, MD 11/12/15 1432  Schuyler Amor, MD 11/12/15 [place], MD 11/12/15 [place], MD 11/12/15 2036

## 2015-11-12 NOTE — Discharge Instructions (Signed)
Go directly to Emergency room as discussed. This is very important.   Edema Edema is an abnormal buildup of fluids in your bodytissues. Edema is somewhatdependent on gravity to pull the fluid to the lowest place in your body. That makes the condition more common in the legs and thighs (lower extremities). Painless swelling of the feet and ankles is common and becomes more likely as you get older. It is also common in looser tissues, like around your eyes.  When the affected area is squeezed, the fluid may move out of that spot and leave a dent for a few moments. This dent is called pitting.  CAUSES  There are many possible causes of edema. Eating too much salt and being on your feet or sitting for a long time can cause edema in your legs and ankles. Hot weather may make edema worse. Common medical causes of edema include:  Heart failure.  Liver disease.  Kidney disease.  Weak blood vessels in your legs.  Cancer.  An injury.  Pregnancy.  Some medications.  Obesity. SYMPTOMS  Edema is usually painless.Your skin may look swollen or shiny.  DIAGNOSIS  Your health care provider may be able to diagnose edema by asking about your medical history and doing a physical exam. You may need to have tests such as X-rays, an electrocardiogram, or blood tests to check for medical conditions that may cause edema.  TREATMENT  Edema treatment depends on the cause. If you have heart, liver, or kidney disease, you need the treatment appropriate for these conditions. General treatment may include:  Elevation of the affected body part above the level of your heart.  Compression of the affected body part. Pressure from elastic bandages or support stockings squeezes the tissues and forces fluid back into the blood vessels. This keeps fluid from entering the tissues.  Restriction of fluid and salt intake.  Use of a water pill (diuretic). These medications are appropriate only for some types of edema.  They pull fluid out of your body and make you urinate more often. This gets rid of fluid and reduces swelling, but diuretics can have side effects. Only use diuretics as directed by your health care provider. HOME CARE INSTRUCTIONS   Keep the affected body part above the level of your heart when you are lying down.   Do not sit still or stand for prolonged periods.   Do not put anything directly under your knees when lying down.  Do not wear constricting clothing or garters on your upper legs.   Exercise your legs to work the fluid back into your blood vessels. This may help the swelling go down.   Wear elastic bandages or support stockings to reduce ankle swelling as directed by your health care provider.   Eat a low-salt diet to reduce fluid if your health care provider recommends it.   Only take medicines as directed by your health care provider. SEEK MEDICAL CARE IF:   Your edema is not responding to treatment.  You have heart, liver, or kidney disease and notice symptoms of edema.  You have edema in your legs that does not improve after elevating them.   You have sudden and unexplained weight gain. SEEK IMMEDIATE MEDICAL CARE IF:   You develop shortness of breath or chest pain.   You cannot breathe when you lie down.  You develop pain, redness, or warmth in the swollen areas.   You have heart, liver, or kidney disease and suddenly get edema.  You have a fever and your symptoms suddenly get worse. MAKE SURE YOU:   Understand these instructions.  Will watch your condition.  Will get help right away if you are not doing well or get worse.   This information is not intended to replace advice given to you by your health care provider. Make sure you discuss any questions you have with your health care provider.   Document Released: 10/10/2005 Document Revised: 10/31/2014 Document Reviewed: 08/02/2013 Elsevier Interactive Patient Education [employer].

## 2015-11-12 NOTE — Telephone Encounter (Signed)
Forward to Dr.Lada 

## 2015-11-13 DIAGNOSIS — Z86711 Personal history of pulmonary embolism: Secondary | ICD-10-CM | POA: Insufficient documentation

## 2015-11-13 DIAGNOSIS — I82409 Acute embolism and thrombosis of unspecified deep veins of unspecified lower extremity: Secondary | ICD-10-CM | POA: Insufficient documentation

## 2015-11-14 ENCOUNTER — Encounter: Payer: Self-pay | Admitting: Family Medicine

## 2015-11-16 HISTORY — PX: OTHER SURGICAL HISTORY: SHX169

## 2015-11-19 ENCOUNTER — Emergency Department
Admission: EM | Admit: 2015-11-19 | Discharge: 2015-11-20 | Disposition: A | Discharge status: Home / Self Care | Payer: BLUE CROSS/BLUE SHIELD | Attending: Emergency Medicine | Admitting: Emergency Medicine

## 2015-11-19 ENCOUNTER — Encounter: Payer: Self-pay | Admitting: Urgent Care

## 2015-11-19 DIAGNOSIS — Z7951 Long term (current) use of inhaled steroids: Secondary | ICD-10-CM | POA: Diagnosis not present

## 2015-11-19 DIAGNOSIS — Z3202 Encounter for pregnancy test, result negative: Secondary | ICD-10-CM | POA: Insufficient documentation

## 2015-11-19 DIAGNOSIS — Z7901 Long term (current) use of anticoagulants: Secondary | ICD-10-CM | POA: Diagnosis not present

## 2015-11-19 DIAGNOSIS — J90 Pleural effusion, not elsewhere classified: Secondary | ICD-10-CM | POA: Insufficient documentation

## 2015-11-19 DIAGNOSIS — D649 Anemia, unspecified: Secondary | ICD-10-CM | POA: Diagnosis not present

## 2015-11-19 DIAGNOSIS — Z79899 Other long term (current) drug therapy: Secondary | ICD-10-CM | POA: Diagnosis not present

## 2015-11-19 DIAGNOSIS — R079 Chest pain, unspecified: Secondary | ICD-10-CM

## 2015-11-19 DIAGNOSIS — R0602 Shortness of breath: Secondary | ICD-10-CM | POA: Diagnosis present

## 2015-11-19 HISTORY — DX: Other pulmonary embolism without acute cor pulmonale: I26.99

## 2015-11-19 LAB — CBC
HCT: 29.1 % — ABNORMAL LOW (ref 35.0–47.0)
Hemoglobin: 9.5 g/dL — ABNORMAL LOW (ref 12.0–16.0)
MCH: 27.3 pg (ref 26.0–34.0)
MCHC: 32.8 g/dL (ref 32.0–36.0)
MCV: 83 fL (ref 80.0–100.0)
PLATELETS: 368 10*3/uL (ref 150–440)
RBC: 3.5 MIL/uL — ABNORMAL LOW (ref 3.80–5.20)
RDW: 13.1 % (ref 11.5–14.5)
WBC: 8.9 10*3/uL (ref 3.6–11.0)

## 2015-11-19 NOTE — ED Notes (Signed)
MD Brown at bedside.

## 2015-11-19 NOTE — ED Notes (Addendum)
Patient presents with c/o CP and SOB following a week admission to [facility] secondary to pulmonary embolism. Patient reports that she had a "lytic" procedure and is scheduled for stenting procedure soon.

## 2015-11-20 ENCOUNTER — Emergency Department: Payer: BLUE CROSS/BLUE SHIELD

## 2015-11-20 ENCOUNTER — Encounter: Payer: Self-pay | Admitting: Radiology

## 2015-11-20 LAB — POCT PREGNANCY, URINE: Preg Test, Ur: NEGATIVE

## 2015-11-20 LAB — BASIC METABOLIC PANEL
Anion gap: 11 (ref 5–15)
BUN: 7 mg/dL (ref 6–20)
CO2: 25 mmol/L (ref 22–32)
CREATININE: 0.71 mg/dL (ref 0.44–1.00)
Calcium: 9.4 mg/dL (ref 8.9–10.3)
Chloride: 105 mmol/L (ref 101–111)
GFR calc Af Amer: >60 mL/min (ref >60)
GLUCOSE: 123 mg/dL — AB (ref 65–99)
Potassium: 3.8 mmol/L (ref 3.5–5.1)
SODIUM: 141 mmol/L (ref 135–145)

## 2015-11-20 LAB — TROPONIN I

## 2015-11-20 MED ORDER — IOHEXOL 350 MG/ML SOLN
75.0000 mL | Freq: Once | INTRAVENOUS | Status: AC | PRN
  Administered 2015-11-20: 75 mL via INTRAVENOUS

## 2015-11-20 NOTE — Discharge Instructions (Signed)
Pleural Effusion °A pleural effusion is an abnormal buildup of fluid in the layers of tissue between your lungs and the inside of your chest (pleural space). These two layers of tissue that line both your lungs and the inside of your chest are called pleura. Usually, there is no air in the space between the pleura, only a thin layer of fluid. If left untreated, a large amount of fluid can build up and cause the lung to collapse. A pleural effusion is usually caused by another disease that requires treatment. °The two main types of pleural effusion are: °· Transudative pleural effusion. This happens when fluid leaks into the pleural space because of a low protein count in your blood or high blood pressure in your vessels. Heart failure often causes this. °· Exudative infusion. This occurs when fluid collects in the pleural space from blocked blood vessels or lymph vessels. Some lung diseases, injuries, and cancers can cause this type of effusion. °CAUSES °Pleural effusion can be caused by: °· Heart failure. °· A blood clot in the lung (pulmonary embolism). °· Pneumonia. °· Cancer. °· Liver failure (cirrhosis). °· Kidney disease. °· Complications from surgery, such as from open heart surgery. °SIGNS AND SYMPTOMS °In some cases, pleural effusion may cause no symptoms. Symptoms can include: °· Shortness of breath, especially when lying down. °· Chest pain, often worse when taking a deep breath. °· Fever. °· Dry cough that is lasting (chronic). °· Hiccups. °· Rapid breathing. °An underlying condition that is causing the pleural effusion (such as heart failure, pneumonia, blood clots, tuberculosis, or cancer) may also cause additional symptoms. °DIAGNOSIS °Your health care provider may suspect pleural effusion based on your symptoms and medical history. Your health care provider will also do a physical exam and a chest X-ray. If the X-ray shows there is fluid in your chest, you may need to have this fluid removed using a  needle (thoracentesis) so it can be tested. °You may also have: °· Imaging studies of the chest, such as: °¨ Ultrasound. °¨ CT scan. °· Blood tests for kidney and liver function. °TREATMENT °Treatment depends on the cause of the pleural effusion. Treatment may include: °· Taking antibiotic medicines to clear up an infection that is causing the pleural effusion. °· Placing a tube in the chest to drain the effusion (tube thoracostomy). This procedure is often used when there is an infection in the fluid. °· Surgery to remove the fibrous outer layer of tissue from the pleural space (decortication). °· Thoracentesis, which can improve cough and shortness of breath. °· A procedure to put medicine into the chest cavity to seal the pleural space to prevent fluid buildup (pleurodesis). °· Chemotherapy and radiation therapy. These may be required in the case of cancerous (malignant) pleural effusion. °HOME CARE INSTRUCTIONS °· Take medicines only as directed by your health care provider. °· Keep track of how long you can gently exercise before you get short of breath. Try simply walking at first. °· Do not use any tobacco products, including cigarettes, chewing tobacco, or electronic cigarettes. If you need help quitting, ask your health care provider. °· Keep all follow-up visits as directed by your health care provider. This is important. °SEEK MEDICAL CARE IF: °· The amount of time that you are able to exercise decreases or does not improve with time. °· You have pain or signs of infection at the puncture site if you had thoracentesis. Watch for: °¨ Drainage. °¨ Redness. °¨ Swelling. °· You have a fever. °  SEEK IMMEDIATE MEDICAL CARE IF: °· You are short of breath. °· You develop chest pain. °· You develop a new cough. °MAKE SURE YOU: °· Understand these instructions. °· Will watch your condition. °· Will get help right away if you are not doing well or get worse. °  °This information is not intended to replace advice  given to you by your health care provider. Make sure you discuss any questions you have with your health care provider. °  °Document Released: 10/10/2005 Document Revised: 10/31/2014 Document Reviewed: 03/05/2014 °Elsevier Interactive Patient Education ©2016 [employer] ° °

## 2015-11-20 NOTE — ED Notes (Signed)
MD Brown at bedside.

## 2015-11-20 NOTE — ED Notes (Signed)
Patient transported to CT 

## 2015-11-20 NOTE — ED Notes (Signed)
Pt will be d/c once papers are printed and ready

## 2015-11-20 NOTE — ED Notes (Signed)
CT called regarding pts neg preg. Will come get pt momentarily

## 2015-11-20 NOTE — ED Provider Notes (Signed)
[facility] Emergency Department Provider Note  ____________________________________________  Time seen: 11:35 PM  I have reviewed the triage vital signs and the nursing notes.   HISTORY  Chief Complaint Shortness of Breath   HPI Ana Knapp is a 35 y.o. female presents withchief complaint of chest pain and shortness of breath following week admission at [facility] secondary to pulmonary emboli that she underwent "lytics procedure". Patient's admits to current 5 out of 10 chest pain that is central nonradiating accompanied by dyspnea     Past Medical History  Diagnosis Date  . Chronic sinusitis   . Fibromyalgia   . Pulmonary embolism [facility])     Patient Active Problem List   Diagnosis Date Noted  . Asthma exacerbation attacks 11/04/2015  . Sinus problem 10/13/2015  . Abdominal pain, chronic, epigastric 09/28/2015  . GERD (gastroesophageal reflux disease) 09/28/2015  . Oral contraceptive prescribed 09/28/2015  . Flushing 09/24/2015  . Preventative health care 08/30/2015  . Fibromyalgia 08/30/2015  . Seasonal allergic rhinitis 08/30/2015  . Hair loss 08/30/2015    No past surgical history on file.  Current Outpatient Rx  Name  Route  Sig  Dispense  Refill  . acetaminophen (TYLENOL) 325 MG tablet   Oral   Take 650 mg by mouth every 6 (six) hours as needed.         Marland Kitchen albuterol (PROVENTIL HFA;VENTOLIN HFA) 108 (90 Base) MCG/ACT inhaler   Inhalation   Inhale 2 puffs into the lungs every 6 (six) hours as needed for wheezing or shortness of breath.         . enoxaparin (LOVENOX) 80 MG/0.8ML injection   Subcutaneous   Inject 80 mg into the skin 2 (two) times daily.         . fluticasone (FLONASE) 50 MCG/ACT nasal spray   Each Nare   Place 2 sprays into both nostrils daily.         . montelukast (SINGULAIR) 10 MG tablet   Oral   Take 1 tablet (10 mg total) by mouth at bedtime.   30 tablet   3   . oxyCODONE-acetaminophen  (PERCOCET/ROXICET) 5-325 MG tablet   Oral   Take 1-2 tablets by mouth every 4 (four) hours as needed for moderate pain or severe pain.         . pantoprazole (PROTONIX) 40 MG tablet   Oral   Take 1 tablet (40 mg total) by mouth daily.   30 tablet   2   . guaiFENesin-codeine 100-10 MG/5ML syrup   Oral   Take 5 mLs by mouth 3 (three) times daily as needed for cough. Patient not taking: Reported on 11/12/2015   75 mL   0   . Norgestimate-Ethinyl Estradiol Triphasic (ORTHO TRI-CYCLEN, 28,) 0.18/0.215/0.25 MG-35 MCG tablet   Oral   Take 1 tablet by mouth daily. Patient not taking: Reported on 11/20/2015   1 Package   11   . sucralfate (CARAFATE) 1 GM/10ML suspension   Oral   Take 10 mLs (1 g total) by mouth 4 (four) times daily -  with meals and at bedtime. Patient not taking: Reported on 11/12/2015   420 mL   0   . traZODone (DESYREL) 50 MG tablet   Oral   Take 1 tablet (50 mg total) by mouth at bedtime as needed for sleep. Patient not taking: Reported on 11/12/2015   30 tablet   3     Allergies Prednisone and Augmentin  Family History  Problem Relation  Age of Onset  . Thyroid disease Mother   . Diabetes Mother   . Cancer Father     bladder  . Hypertension Maternal Grandmother   . Thyroid disease Paternal Grandmother   . Diabetes Paternal Grandmother   . Stroke Paternal Grandmother   . Cancer Paternal Grandfather     skin  . Heart disease Neg Hx   . COPD Neg Hx     Social History Social History  Substance Use Topics  . Smoking status: Never Smoker   . Smokeless tobacco: Never Used  . Alcohol Use: No     Comment: rare    Review of Systems  Constitutional: Negative for fever. Eyes: Negative for visual changes. ENT: Negative for sore throat. Cardiovascular: Positive for chest pain. Respiratory: Positive for shortness of breath. Gastrointestinal: Negative for abdominal pain, vomiting and diarrhea. Genitourinary: Negative for dysuria. Musculoskeletal:  Negative for back pain. Skin: Negative for rash. Neurological: Negative for headaches, focal weakness or numbness.   10-point ROS otherwise negative.  ____________________________________________   PHYSICAL EXAM:  VITAL SIGNS: ED Triage Vitals  Enc Vitals Group     BP 11/19/15 2322 125/68 mmHg     Pulse Rate 11/19/15 2322 96     Resp 11/19/15 2322 18     Temp 11/19/15 2322 97.8 F (36.6 C)     Temp Source 11/19/15 2322 Oral     SpO2 11/19/15 2322 96 %     Weight --      Height --      Head Cir --      Peak Flow --      Pain Score 11/19/15 2317 5     Pain Loc --      Pain Edu? --      Excl. in [place]? --      Constitutional: Alert and oriented. Well appearing and in no distress. Eyes: Conjunctivae are normal. PERRL. Normal extraocular movements. ENT   Head: Normocephalic and atraumatic.   Nose: No congestion/rhinnorhea.   Mouth/Throat: Mucous membranes are moist.   Neck: No stridor. Hematological/Lymphatic/Immunilogical: No cervical lymphadenopathy. Cardiovascular: Normal rate, regular rhythm. Normal and symmetric distal pulses are present in all extremities. No murmurs, rubs, or gallops. Respiratory: Normal respiratory effort without tachypnea nor retractions. Breath sounds are clear and equal bilaterally. No wheezes/rales/rhonchi. Gastrointestinal: Soft and nontender. No distention. There is no CVA tenderness. Genitourinary: deferred Musculoskeletal: Nontender with normal range of motion in all extremities. No joint effusions.  No lower extremity tenderness nor edema. Neurologic:  Normal speech and language. No gross focal neurologic deficits are appreciated. Speech is normal.  Skin:  Skin is warm, dry and intact. No rash noted. Psychiatric: Mood and affect are normal. Speech and behavior are normal. Patient exhibits appropriate insight and judgment.  ____________________________________________    LABS (pertinent positives/negatives)  Labs Reviewed   BASIC METABOLIC PANEL - Abnormal; Notable for the following:    Glucose, Bld 123 (*)    All other components within normal limits  CBC - Abnormal; Notable for the following:    RBC 3.50 (*)    Hemoglobin 9.5 (*)    HCT 29.1 (*)    All other components within normal limits  TROPONIN I  POCT PREGNANCY, URINE     ____________________________________________   EKG  ED ECG REPORT I, Abdulah Iqbal, Gaston N, the attending physician, personally viewed and interpreted this ECG.   Date: 11/21/2015  EKG Time: 11:18 PM  Rate: 96  Rhythm: Normal sinus rhythm  Axis: none  Intervals: Normal  ST&T Change: None   ____________________________________________    RADIOLOGY     CT Angio Chest PE W/Cm &/Or Wo Cm (Final result) Result time: 11/20/15 01:20:07   Final result by Rad Results In Interface (11/20/15 01:20:07)   Narrative:   CLINICAL DATA: Chest pain and shortness of breath following a week admission to [facility] secondary to pulmonary embolus.  EXAM: CT ANGIOGRAPHY CHEST WITH CONTRAST  TECHNIQUE: Multidetector CT imaging of the chest was performed using the standard protocol during bolus administration of intravenous contrast. Multiplanar CT image reconstructions and MIPs were obtained to evaluate the vascular anatomy.  CONTRAST: 14mL OMNIPAQUE IOHEXOL 350 MG/ML SOLN  COMPARISON: 11/12/2015  FINDINGS: Technically adequate study with good opacification of the central and segmental pulmonary arteries. Focal filling defects are demonstrated in right lower lobe pulmonary arteries consistent with residual emboli. These are less prominent than on the prior study and appear eccentric suggesting recanalization. Emboli previously demonstrated in the left lower lobe are not appreciated today. No new central emboli.  Normal heart size. Normal caliber thoracic aorta. No evidence of aortic dissection. Mediastinal lymph nodes are not pathologically enlarged. Esophagus is  decompressed.  Small bilateral pleural effusions, new since previous study. New airspace consolidation in both lung bases with air bronchograms. This could represent consolidation, pneumonia, atelectasis, or infarcts. This is progressed since previous study. No pneumothorax.  Included portions of the upper abdominal organs are grossly unremarkable. No destructive bone lesions.  Review of the MIP images confirms the above findings.  IMPRESSION: Improving lower lobe pulmonary emboli without progression since previous study. Increasing consolidation in the lung bases bilaterally with small bilateral pleural effusions developing since previous study.   Electronically Signed By: Lucienne Capers M.D. On: 11/20/2015 01:20          INITIAL IMPRESSION / ASSESSMENT AND PLAN / ED COURSE  Pertinent labs & imaging results that were available during my care of the patient were reviewed by me and considered in my medical decision making (see chart for details).  Patient states that her dyspnea is much improved on reexamination. CT scan of the chest PE protocol revealed a improving lower lobe pulmonary emboli without progression. I informed the patient of her anemia and informed her of the necessity of follow-up  ____________________________________________   FINAL CLINICAL IMPRESSION(S) / ED DIAGNOSES  Final diagnoses:  Pleural effusion  Anemia, unspecified anemia type      Gregor Hams, MD 11/21/15 [phone]

## 2015-11-22 ENCOUNTER — Emergency Department: Payer: BLUE CROSS/BLUE SHIELD

## 2015-11-22 ENCOUNTER — Emergency Department
Admission: EM | Admit: 2015-11-22 | Discharge: 2015-11-22 | Disposition: A | Discharge status: Home / Self Care | Payer: BLUE CROSS/BLUE SHIELD | Attending: Emergency Medicine | Admitting: Emergency Medicine

## 2015-11-22 ENCOUNTER — Encounter: Payer: Self-pay | Admitting: Emergency Medicine

## 2015-11-22 DIAGNOSIS — Z79899 Other long term (current) drug therapy: Secondary | ICD-10-CM | POA: Diagnosis not present

## 2015-11-22 DIAGNOSIS — R079 Chest pain, unspecified: Secondary | ICD-10-CM | POA: Insufficient documentation

## 2015-11-22 DIAGNOSIS — Z7951 Long term (current) use of inhaled steroids: Secondary | ICD-10-CM | POA: Insufficient documentation

## 2015-11-22 DIAGNOSIS — Z7901 Long term (current) use of anticoagulants: Secondary | ICD-10-CM | POA: Diagnosis not present

## 2015-11-22 DIAGNOSIS — R0602 Shortness of breath: Secondary | ICD-10-CM

## 2015-11-22 DIAGNOSIS — R600 Localized edema: Secondary | ICD-10-CM | POA: Diagnosis not present

## 2015-11-22 DIAGNOSIS — J45901 Unspecified asthma with (acute) exacerbation: Secondary | ICD-10-CM | POA: Diagnosis not present

## 2015-11-22 DIAGNOSIS — J90 Pleural effusion, not elsewhere classified: Secondary | ICD-10-CM | POA: Diagnosis not present

## 2015-11-22 LAB — CBC WITH DIFFERENTIAL/PLATELET
BASOS ABS: 0.2 10*3/uL — AB (ref 0–0.1)
EOS ABS: 0.5 10*3/uL (ref 0–0.7)
Eosinophils Relative: 4 %
HCT: 35.9 % (ref 35.0–47.0)
HEMOGLOBIN: 11.6 g/dL — AB (ref 12.0–16.0)
Lymphocytes Relative: 20 %
Lymphs Abs: 2.7 10*3/uL (ref 1.0–3.6)
MCH: 26.8 pg (ref 26.0–34.0)
MCHC: 32.3 g/dL (ref 32.0–36.0)
MCV: 83.1 fL (ref 80.0–100.0)
Monocytes Absolute: 0.8 10*3/uL (ref 0.2–0.9)
NEUTROS ABS: 8.9 10*3/uL — AB (ref 1.4–6.5)
PLATELETS: 572 10*3/uL — AB (ref 150–440)
RBC: 4.32 MIL/uL (ref 3.80–5.20)
RDW: 13.4 % (ref 11.5–14.5)
WBC: 13.2 10*3/uL — AB (ref 3.6–11.0)

## 2015-11-22 LAB — BRAIN NATRIURETIC PEPTIDE: B NATRIURETIC PEPTIDE 5: 4 pg/mL (ref 0.0–100.0)

## 2015-11-22 LAB — BASIC METABOLIC PANEL
ANION GAP: 9 (ref 5–15)
BUN: 12 mg/dL (ref 6–20)
CHLORIDE: 104 mmol/L (ref 101–111)
CO2: 27 mmol/L (ref 22–32)
Calcium: 9.6 mg/dL (ref 8.9–10.3)
Creatinine, Ser: 0.86 mg/dL (ref 0.44–1.00)
Glucose, Bld: 105 mg/dL — ABNORMAL HIGH (ref 65–99)
POTASSIUM: 4 mmol/L (ref 3.5–5.1)
SODIUM: 140 mmol/L (ref 135–145)

## 2015-11-22 LAB — TROPONIN I

## 2015-11-22 MED ORDER — AZITHROMYCIN 500 MG PO TABS
500.0000 mg | ORAL_TABLET | Freq: Once | ORAL | Status: AC
  Administered 2015-11-22: 500 mg via ORAL
  Filled 2015-11-22: qty 1

## 2015-11-22 MED ORDER — FUROSEMIDE 10 MG/ML IJ SOLN
10.0000 mg | Freq: Once | INTRAMUSCULAR | Status: AC
  Administered 2015-11-22: 10 mg via INTRAVENOUS
  Filled 2015-11-22: qty 4

## 2015-11-22 MED ORDER — AZITHROMYCIN 250 MG PO TABS: 250.0000 mg | ORAL_TABLET | Freq: Every day | ORAL | Status: DC

## 2015-11-22 NOTE — ED Notes (Signed)
Patient with complaint of chest tightness and shortness of breath that started yesterday. Patient states that she was recently discharged with blood clots that were in her leg and moved to her abd. Patient states that she was seen here Thursday and had a ct scan and was told that she has fluid in her lungs.

## 2015-11-22 NOTE — ED Notes (Signed)
Patient ambulated around unit with pulse oxi attached.  Oxygen sat remaining between 96% and 97%.  Denies increased shortness of breath or dizziness.

## 2015-11-22 NOTE — ED Provider Notes (Signed)
[facility] Emergency Department Provider Note  ____________________________________________  Time seen: Approximately 3:40 AM  I have reviewed the triage vital signs and the nursing notes.   HISTORY  Chief Complaint Chest Pain and Shortness of Breath    HPI Ana Knapp is a 35 y.o. female who presents to the ED from home with a chief complaint of chest pain and shortness of breath. 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 She was seen in the ED tonight's ago for chest pain and shortness of breath. Repeat CT angiogram chest revealed small bilateral pleural effusions. Patient states since discharge she has noted more pressure type sensation to the substernal area and associated shortness of breath on supine positioning. Denies increased dyspnea on exertion. She is taking subcutaneous Lovenox twice daily with the intention of switching to Eliquis after 2 weeks of Lovenox. States she is using incentive spirometer as directed by her vascular surgeon. Denies associated fever, chills, cough, congestion, abdominal pain, nausea, vomiting, diarrhea. Notes she has not had to take Percocet which was prescribed for her leg pain. States overall her left lower extremity swelling is improving.   Past Medical History  Diagnosis Date  . Chronic sinusitis   . Fibromyalgia   . Pulmonary embolism [facility])     Patient Active Problem List   Diagnosis Date Noted  . Asthma exacerbation attacks 11/04/2015  . Sinus problem 10/13/2015  . Abdominal pain, chronic, epigastric 09/28/2015  . GERD (gastroesophageal reflux disease) 09/28/2015  . Oral contraceptive prescribed 09/28/2015  . Flushing 09/24/2015  . Preventative health care 08/30/2015  . Fibromyalgia 08/30/2015  . Seasonal allergic rhinitis 08/30/2015  . Hair loss 08/30/2015    No past surgical history on  file.  Current Outpatient Rx  Name  Route  Sig  Dispense  Refill  . acetaminophen (TYLENOL) 325 MG tablet   Oral   Take 650 mg by mouth every 6 (six) hours as needed.         Marland Kitchen albuterol (PROVENTIL HFA;VENTOLIN HFA) 108 (90 Base) MCG/ACT inhaler   Inhalation   Inhale 2 puffs into the lungs every 6 (six) hours as needed for wheezing or shortness of breath.         . enoxaparin (LOVENOX) 80 MG/0.8ML injection   Subcutaneous   Inject 80 mg into the skin 2 (two) times daily.         . fluticasone (FLONASE) 50 MCG/ACT nasal spray   Each Nare   Place 2 sprays into both nostrils daily.         . montelukast (SINGULAIR) 10 MG tablet   Oral   Take 1 tablet (10 mg total) by mouth at bedtime.   30 tablet   3   . oxyCODONE-acetaminophen (PERCOCET/ROXICET) 5-325 MG tablet   Oral   Take 1-2 tablets by mouth every 4 (four) hours as needed for moderate pain or severe pain.         . pantoprazole (PROTONIX) 40 MG tablet   Oral   Take 1 tablet (40 mg total) by mouth daily.   30 tablet   2   . guaiFENesin-codeine 100-10 MG/5ML syrup   Oral   Take 5 mLs by mouth 3 (three) times daily as needed for cough. Patient not taking: Reported on 11/12/2015   75 mL   0   . Norgestimate-Ethinyl Estradiol Triphasic (ORTHO TRI-CYCLEN, 28,) 0.18/0.215/0.25 MG-35 MCG tablet  Oral   Take 1 tablet by mouth daily. Patient not taking: Reported on 11/20/2015   1 Package   11   . sucralfate (CARAFATE) 1 GM/10ML suspension   Oral   Take 10 mLs (1 g total) by mouth 4 (four) times daily -  with meals and at bedtime. Patient not taking: Reported on 11/12/2015   420 mL   0   . traZODone (DESYREL) 50 MG tablet   Oral   Take 1 tablet (50 mg total) by mouth at bedtime as needed for sleep. Patient not taking: Reported on 11/12/2015   30 tablet   3     Allergies Prednisone and Augmentin  Family History  Problem Relation Age of Onset  . Thyroid disease Mother   . Diabetes Mother   . Cancer  Father     bladder  . Hypertension Maternal Grandmother   . Thyroid disease Paternal Grandmother   . Diabetes Paternal Grandmother   . Stroke Paternal Grandmother   . Cancer Paternal Grandfather     skin  . Heart disease Neg Hx   . COPD Neg Hx     Social History Social History  Substance Use Topics  . Smoking status: Never Smoker   . Smokeless tobacco: Never Used  . Alcohol Use: No     Comment: rare    Review of Systems Constitutional: No fever/chills Eyes: No visual changes. ENT: No sore throat. Cardiovascular: Positive for chest pain. Respiratory: Positive for shortness of breath. Gastrointestinal: No abdominal pain.  No nausea, no vomiting.  No diarrhea.  No constipation. Genitourinary: Negative for dysuria. Musculoskeletal: Negative for back pain. Skin: Negative for rash. Neurological: Negative for headaches, focal weakness or numbness.  10-point ROS otherwise negative.  ____________________________________________   PHYSICAL EXAM:  VITAL SIGNS: ED Triage Vitals  Enc Vitals Group     BP 11/22/15 0310 120/76 mmHg     Pulse Rate 11/22/15 0310 90     Resp 11/22/15 0310 21     Temp 11/22/15 0310 98 F (36.7 C)     Temp Source 11/22/15 0310 Oral     SpO2 11/22/15 0310 98 %     Weight 11/22/15 0310 153 lb (69.4 kg)     Height 11/22/15 0310 5\' 9"  (1.753 m)     Head Cir --      Peak Flow --      Pain Score 11/22/15 0311 7     Pain Loc --      Pain Edu? --      Excl. in [place]? --     Constitutional: Alert and oriented. Well appearing and in no acute distress. Eyes: Conjunctivae are normal. PERRL. EOMI. Head: Atraumatic. Nose: No congestion/rhinnorhea. Mouth/Throat: Mucous membranes are moist.  Oropharynx non-erythematous. Neck: No stridor.   Cardiovascular: Normal rate, regular rhythm. Grossly normal heart sounds.  Good peripheral circulation. Respiratory: Normal respiratory effort.  No retractions. Lungs CTAB. No wheezing, rales or  rhonchi. Gastrointestinal: Soft and nontender. No distention. No abdominal bruits. No CVA tenderness. Musculoskeletal: LLE edema compared to right.  Supple calf without evidence for compartment syndrome. Symmetrically warm limb without evidence for ischemia. 2+ distal pulses. No joint effusions. Neurologic:  Normal speech and language. No gross focal neurologic deficits are appreciated. No gait instability. Skin:  Skin is warm, dry and intact. No rash noted. Psychiatric: Mood and affect are normal. Speech and behavior are normal.  ____________________________________________   LABS (all labs ordered are listed, but only abnormal results are displayed)  Labs Reviewed  CBC WITH DIFFERENTIAL/PLATELET - Abnormal; Notable for the following:    WBC 13.2 (*)    Hemoglobin 11.6 (*)    Platelets 572 (*)    Neutro Abs 8.9 (*)    Basophils Absolute 0.2 (*)    All other components within normal limits  BASIC METABOLIC PANEL - Abnormal; Notable for the following:    Glucose, Bld 105 (*)    All other components within normal limits  TROPONIN I  BRAIN NATRIURETIC PEPTIDE   ____________________________________________  EKG  ED ECG REPORT I, SUNG,JADE J, the attending physician, personally viewed and interpreted this ECG.   Date: 11/22/2015  EKG Time: 0307  Rate: 98  Rhythm: normal EKG, normal sinus rhythm  Axis: Normal  Intervals:none  ST&T Change: Nonspecific  ____________________________________________  RADIOLOGY  Chest 2 view (reviewed by me, interpreted per Dr. Gerilyn Nestle): Developing infiltration or atelectasis in the posterior lung bases. Normal heart size. ____________________________________________   PROCEDURES  Procedure(s) performed: None  Critical Care performed: No  ____________________________________________   INITIAL IMPRESSION / ASSESSMENT AND PLAN / ED COURSE  Pertinent labs & imaging results that were available during my care of the patient were reviewed  by me and considered in my medical decision making (see chart for details).  35 year old female who presents with chest pain and shortness of breath; recent hospitalization for extensive iliofemoral DVT with bilateral pulmonary emboli. CT angiogram 2 days ago demonstrates: 'improving lower lobe pulmonary emboli without progression compared to prior study dated 11/12/2015. Increasing consolidation in lung bases bilaterally with small bilateral pleural effusions developing since previous study'. Will obtain screening lab work including BNP and troponin, chest x-ray; will ambulate patient with pulse oximeter. Given patient had very recent repeat CT chest, I am hesitant to rescan her given the radiation risk as I do not see a great benefit in doing so.  ----------------------------------------- 4:20 AM on 11/22/2015 -----------------------------------------  Room air saturations maintained at 96% with patient ambulating around the ED. She is not particularly tachypneic or dyspneic upon returning to her treatment room.  ----------------------------------------- 5:17 AM on 11/22/2015 -----------------------------------------  Updated patient and spouse of laboratory and imaging results. She is resting comfortably in no acute distress with room air saturations 98%. H/H have improved since prior visit. Given mild leukocytosis with developing infiltration in posterior lung bases seen on chest x-ray, I did recommend starting a short course of antibiotics to cover for possible infiltrates. I also discussed with patient a very low dose of Lasix for gentle diuresis of small bilateral pleural effusions seen on prior CT as well as mild vascular congestion seen on tonight's chest x-ray. She will follow-up in the next 2 days with her PCP. We did also discuss whether anxiety might be contributing to her symptoms; patient is not keen on taking additional medicines and does not feel anxiety is a strong component  contributing to her symptoms. Strict return precautions given. Patient and spouse verbalize understanding and agree with plan of care. ____________________________________________   FINAL CLINICAL IMPRESSION(S) / ED DIAGNOSES  Final diagnoses:  Chest pain, unspecified chest pain type  Shortness of breath      Paulette Blanch, MD 11/22/15 [phone]

## 2015-11-22 NOTE — Discharge Instructions (Signed)
You received a low dose IV diuretic this morning to eliminate some of the fluid that is in your lungs. Please take the antibiotic as prescribed (azithromycin 250 mg daily 4 days) for possible lung infection. Return to the ER for worsening symptoms, persistent vomiting, difficulty breathing, increased left leg swelling or pain, or other concerns.  Nonspecific Chest Pain  Chest pain can be caused by many different conditions. There is always a chance that your pain could be related to something serious, such as a heart attack or a blood clot in your lungs. Chest pain can also be caused by conditions that are not life-threatening. If you have chest pain, it is very important to follow up with your health care provider. CAUSES  Chest pain can be caused by:  Heartburn.  Pneumonia or bronchitis.  Anxiety or stress.  Inflammation around your heart (pericarditis) or lung (pleuritis or pleurisy).  A blood clot in your lung.  A collapsed lung (pneumothorax). It can develop suddenly on its own (spontaneous pneumothorax) or from trauma to the chest.  Shingles infection (varicella-zoster virus).  Heart attack.  Damage to the bones, muscles, and cartilage that make up your chest wall. This can include:  Bruised bones due to injury.  Strained muscles or cartilage due to frequent or repeated coughing or overwork.  Fracture to one or more ribs.  Sore cartilage due to inflammation (costochondritis). RISK FACTORS  Risk factors for chest pain may include:  Activities that increase your risk for trauma or injury to your chest.  Respiratory infections or conditions that cause frequent coughing.  Medical conditions or overeating that can cause heartburn.  Heart disease or family history of heart disease.  Conditions or health behaviors that increase your risk of developing a blood clot.  Having had chicken pox (varicella zoster). SIGNS AND SYMPTOMS Chest pain can feel like:  Burning or  tingling on the surface of your chest or deep in your chest.  Crushing, pressure, aching, or squeezing pain.  Dull or sharp pain that is worse when you move, cough, or take a deep breath.  Pain that is also felt in your back, neck, shoulder, or arm, or pain that spreads to any of these areas. Your chest pain may come and go, or it may stay constant. DIAGNOSIS Lab tests or other studies may be needed to find the cause of your pain. Your health care provider may have you take a test called an ambulatory ECG (electrocardiogram). An ECG records your heartbeat patterns at the time the test is performed. You may also have other tests, such as:  Transthoracic echocardiogram (TTE). During echocardiography, sound waves are used to create a picture of all of the heart structures and to look at how blood flows through your heart.  Transesophageal echocardiogram (TEE).This is a more advanced imaging test that obtains images from inside your body. It allows your health care provider to see your heart in finer detail.  Cardiac monitoring. This allows your health care provider to monitor your heart rate and rhythm in real time.  Holter monitor. This is a portable device that records your heartbeat and can help to diagnose abnormal heartbeats. It allows your health care provider to track your heart activity for several days, if needed.  Stress tests. These can be done through exercise or by taking medicine that makes your heart beat more quickly.  Blood tests.  Imaging tests. TREATMENT  Your treatment depends on what is causing your chest pain. Treatment may include:  Medicines. These may include:  Acid blockers for heartburn.  Anti-inflammatory medicine.  Pain medicine for inflammatory conditions.  Antibiotic medicine, if an infection is present.  Medicines to dissolve blood clots.  Medicines to treat coronary artery disease.  Supportive care for conditions that do not require medicines.  This may include:  Resting.  Applying heat or cold packs to injured areas.  Limiting activities until pain decreases. HOME CARE INSTRUCTIONS  If you were prescribed an antibiotic medicine, finish it all even if you start to feel better.  Avoid any activities that bring on chest pain.  Do not use any tobacco products, including cigarettes, chewing tobacco, or electronic cigarettes. If you need help quitting, ask your health care provider.  Do not drink alcohol.  Take medicines only as directed by your health care provider.  Keep all follow-up visits as directed by your health care provider. This is important. This includes any further testing if your chest pain does not go away.  If heartburn is the cause for your chest pain, you may be told to keep your head raised (elevated) while sleeping. This reduces the chance that acid will go from your stomach into your esophagus.  Make lifestyle changes as directed by your health care provider. These may include:  Getting regular exercise. Ask your health care provider to suggest some activities that are safe for you.  Eating a heart-healthy diet. A registered dietitian can help you to learn healthy eating options.  Maintaining a healthy weight.  Managing diabetes, if necessary.  Reducing stress. SEEK MEDICAL CARE IF:  Your chest pain does not go away after treatment.  You have a rash with blisters on your chest.  You have a fever. SEEK IMMEDIATE MEDICAL CARE IF:   Your chest pain is worse.  You have an increasing cough, or you cough up blood.  You have severe abdominal pain.  You have severe weakness.  You faint.  You have chills.  You have sudden, unexplained chest discomfort.  You have sudden, unexplained discomfort in your arms, back, neck, or jaw.  You have shortness of breath at any time.  You suddenly start to sweat, or your skin gets clammy.  You feel nauseous or you vomit.  You suddenly feel  light-headed or dizzy.  Your heart begins to beat quickly, or it feels like it is skipping beats. These symptoms may represent a serious problem that is an emergency. Do not wait to see if the symptoms will go away. Get medical help right away. Call your local emergency services (911 in the [place] Do not drive yourself to the hospital.   This information is not intended to replace advice given to you by your health care provider. Make sure you discuss any questions you have with your health care provider.   Document Released: 07/20/2005 Document Revised: 10/31/2014 Document Reviewed: 05/16/2014 Elsevier Interactive Patient Education 2016 [employer].  Shortness of Breath Shortness of breath means you have trouble breathing. It could also mean that you have a medical problem. You should get immediate medical care for shortness of breath. CAUSES   Not enough oxygen in the air such as with high altitudes or a smoke-filled room.  Certain lung diseases, infections, or problems.  Heart disease or conditions, such as angina or heart failure.  Low red blood cells (anemia).  Poor physical fitness, which can cause shortness of breath when you exercise.  Chest or back injuries or stiffness.  Being overweight.  Smoking.  Anxiety, which can  make you feel like you are not getting enough air. DIAGNOSIS  Serious medical problems can often be found during your physical exam. Tests may also be done to determine why you are having shortness of breath. Tests may include:  Chest X-rays.  Lung function tests.  Blood tests.  An electrocardiogram (ECG).  An ambulatory electrocardiogram. An ambulatory ECG records your heartbeat patterns over a 24-hour period.  Exercise testing.  A transthoracic echocardiogram (TTE). During echocardiography, sound waves are used to evaluate how blood flows through your heart.  A transesophageal echocardiogram (TEE).  Imaging scans. Your health care  provider may not be able to find a cause for your shortness of breath after your exam. In this case, it is important to have a follow-up exam with your health care provider as directed.  TREATMENT  Treatment for shortness of breath depends on the cause of your symptoms and can vary greatly. HOME CARE INSTRUCTIONS   Do not smoke. Smoking is a common cause of shortness of breath. If you smoke, ask for help to quit.  Avoid being around chemicals or things that may bother your breathing, such as paint fumes and dust.  Rest as needed. Slowly resume your usual activities.  If medicines were prescribed, take them as directed for the full length of time directed. This includes oxygen and any inhaled medicines.  Keep all follow-up appointments as directed by your health care provider. SEEK MEDICAL CARE IF:   Your condition does not improve in the time expected.  You have a hard time doing your normal activities even with rest.  You have any new symptoms. SEEK IMMEDIATE MEDICAL CARE IF:   Your shortness of breath gets worse.  You feel light-headed, faint, or develop a cough not controlled with medicines.  You start coughing up blood.  You have pain with breathing.  You have chest pain or pain in your arms, shoulders, or abdomen.  You have a fever.  You are unable to walk up stairs or exercise the way you normally do. MAKE SURE YOU:  Understand these instructions.  Will watch your condition.  Will get help right away if you are not doing well or get worse.   This information is not intended to replace advice given to you by your health care provider. Make sure you discuss any questions you have with your health care provider.   Document Released: 07/05/2001 Document Revised: 10/15/2013 Document Reviewed: 12/26/2011 Elsevier Interactive Patient Education [employer].

## 2015-11-22 NOTE — ED Notes (Signed)
MD at bedside. 

## 2015-11-30 ENCOUNTER — Ambulatory Visit (INDEPENDENT_AMBULATORY_CARE_PROVIDER_SITE_OTHER): Payer: BLUE CROSS/BLUE SHIELD | Admitting: Family Medicine

## 2015-11-30 ENCOUNTER — Encounter: Payer: Self-pay | Admitting: Family Medicine

## 2015-11-30 VITALS — BP 108/70 | HR 86 | Temp 98.8°F | Ht 67.25 in | Wt 146.0 lb

## 2015-11-30 DIAGNOSIS — R634 Abnormal weight loss: Secondary | ICD-10-CM

## 2015-11-30 DIAGNOSIS — D62 Acute posthemorrhagic anemia: Secondary | ICD-10-CM | POA: Diagnosis not present

## 2015-11-30 DIAGNOSIS — I871 Compression of vein: Secondary | ICD-10-CM

## 2015-11-30 DIAGNOSIS — R7989 Other specified abnormal findings of blood chemistry: Secondary | ICD-10-CM | POA: Diagnosis not present

## 2015-11-30 DIAGNOSIS — R232 Flushing: Secondary | ICD-10-CM

## 2015-11-30 DIAGNOSIS — D649 Anemia, unspecified: Secondary | ICD-10-CM | POA: Insufficient documentation

## 2015-11-30 DIAGNOSIS — G8929 Other chronic pain: Secondary | ICD-10-CM | POA: Diagnosis not present

## 2015-11-30 DIAGNOSIS — R1013 Epigastric pain: Secondary | ICD-10-CM

## 2015-11-30 DIAGNOSIS — K219 Gastro-esophageal reflux disease without esophagitis: Secondary | ICD-10-CM | POA: Diagnosis not present

## 2015-11-30 HISTORY — DX: Compression of vein: I87.1

## 2015-11-30 MED ORDER — ONDANSETRON 4 MG PO TBDP: 4.0000 mg | ORAL_TABLET | Freq: Three times a day (TID) | ORAL | Status: DC | PRN

## 2015-11-30 NOTE — Patient Instructions (Addendum)
Please do eat yogurt daily or take a probiotic daily for the next month or two We want to replace the healthy germs in the gut If you notice foul, watery diarrhea in the next two months, schedule an appointment RIGHT AWAY We'll contact you about the blood test results for today Your doctors at [facility] should be able to access your results through [place], and you can access your results through MyChart Please do the 24 hour urine collection Return in one month, but call sooner if needed

## 2015-11-30 NOTE — Assessment & Plan Note (Signed)
Check liver enzymes and lipase

## 2015-11-30 NOTE — Progress Notes (Signed)
BP 108/70 mmHg  Pulse 86  Temp(Src) 98.8 F (37.1 C)  Ht 5' 7.25" (1.708 m)  Wt 146 lb (66.225 kg)  BMI 22.70 kg/m2  SpO2 100%  LMP 11/16/2015 (Approximate)   Subjective:    Patient ID: Ana Knapp, female    DOB: [birth year], 35 y.o.   MRN: [id number]  HPI: Ana Knapp is a 35 y.o. female  Chief Complaint  Patient presents with  . 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 Will start Eliquis Feb 9th They stopped her OCP; she asked about what to do (we discussed progesterone only vs condoms; she'll use barrier)  She is not in much pain, not a whole lot Listening to her body Not short of breath right now Has had a little bit of chest pain; had multiple PEs which broke off from the large DVT Dry cough coming up, gets hung up, clears up; no fevers; runny nose, just got off antibiotics Some nausea with all this; they gave her zofran under the tongue and it really helped  The sweating has stopped; no more flushing; heart beats fast when taking the shot; she never did the 24 hour urine collection  Lab Results  Component Value Date   WBC 9.1 12/11/2015   HGB 11.8* 12/11/2015   HCT 35.5 12/11/2015   MCV 81.9 12/11/2015   PLT 211 12/11/2015     Chemistry      Component Value Date/Time   NA 138 12/11/2015 2010   NA 139 11/30/2015 1629   K 3.9 12/11/2015 2010   CL 108 12/11/2015 2010   CO2 22 12/11/2015 2010   BUN 16 12/11/2015 2010   BUN 13 11/30/2015 1629   CREATININE 0.80 12/11/2015 2010      Component Value Date/Time   CALCIUM 8.9 12/11/2015 2010   ALKPHOS 61 11/30/2015 1629   AST 14 11/30/2015 1629   ALT 16 11/30/2015 1629   BILITOT 0.3 11/30/2015 1629      Relevant past medical, surgical, family and social history reviewed Interim medical history since our last visit reviewed. Allergies and medications reviewed and updated.  Review of Systems Per HPI unless specifically indicated above     Objective:    BP 108/70 mmHg  Pulse 86  Temp(Src) 98.8 F (37.1 C)  Ht 5' 7.25" (1.708 m)  Wt 146 lb (66.225 kg)  BMI 22.70 kg/m2  SpO2 100%  LMP 11/16/2015 (Approximate)  Wt Readings from Last 3 Encounters:  12/11/15 144 lb (65.318 kg)  11/30/15 146 lb (66.225 kg)  11/22/15 153 lb (69.4 kg)    Physical Exam  Constitutional: She appears well-developed and well-nourished. No distress.  HENT:  Head: Normocephalic and atraumatic.  Eyes: EOM are normal. No scleral icterus.  Neck: No thyromegaly present.  Cardiovascular: Normal rate and regular rhythm.   Pulmonary/Chest: Effort normal and breath sounds normal.  Abdominal: Soft. She exhibits no distension.  Musculoskeletal: She exhibits no edema.  Neurological: She is alert.  Skin: No ecchymosis and no petechiae noted. She is not diaphoretic. No pallor.  Psychiatric: She has a normal mood and affect.  Assessment & Plan:   Problem List Items Addressed This Visit      Cardiovascular and Mediastinum   Flushing    Encouraged patient to complete the urine testing, previous 24 hour test still pending      May-Thurner syndrome    Diagnosed at [facility], vascular surgery; Jan 2017        Digestive   GERD (gastroesophageal reflux disease)    Continue PPI      Relevant Medications   calcium carbonate (TUMS - DOSED IN MG ELEMENTAL CALCIUM) 500 MG chewable tablet   polyethylene glycol (MIRALAX / GLYCOLAX) packet   ondansetron (ZOFRAN ODT) 4 MG disintegrating tablet     Other   Abdominal pain, chronic, epigastric    Check liver enzymes and lipase      Relevant Orders   Comprehensive metabolic panel (Completed)   Anemia - Primary    Status post acute blood loss during  vascular surgery      Relevant Orders   CBC with Differential/Platelet (Completed)   Hyperphosphatemia   Relevant Orders   Phosphorus (Completed)   Low serum calcium    Vit D, phosphorous, calcium      Relevant Orders   VITAMIN D 25 Hydroxy (Vit-D Deficiency, Fractures) (Completed)   Parathyroid hormone, intact (no Ca) (Completed)   Weight loss, unintentional   Relevant Orders   TSH (Completed)      Follow up plan: Return in about 1 month (around 12/28/2015) for follow-up.  Orders Placed This Encounter  Procedures  . VITAMIN D 25 Hydroxy (Vit-D Deficiency, Fractures)  . TSH  . Parathyroid hormone, intact (no Ca)  . Phosphorus  . CBC with Differential/Platelet  . Comprehensive metabolic panel   An after-visit summary was printed and given to the patient at [facility].  Please see the patient instructions which may contain other information and recommendations beyond what is mentioned above in the assessment and plan.

## 2015-11-30 NOTE — Assessment & Plan Note (Signed)
Vit D, phosphorous, calcium

## 2015-11-30 NOTE — Assessment & Plan Note (Signed)
Status post acute blood loss during vascular surgery

## 2015-11-30 NOTE — Assessment & Plan Note (Signed)
Continue PPI ?

## 2015-12-01 ENCOUNTER — Other Ambulatory Visit: Payer: Self-pay | Admitting: Family Medicine

## 2015-12-01 LAB — PHOSPHORUS: Phosphorus: 4.7 mg/dL — ABNORMAL HIGH (ref 2.5–4.5)

## 2015-12-01 LAB — COMPREHENSIVE METABOLIC PANEL
A/G RATIO: 1.5 (ref 1.1–2.5)
ALT: 16 IU/L (ref 0–32)
AST: 14 IU/L (ref 0–40)
Albumin: 4.2 g/dL (ref 3.5–5.5)
Alkaline Phosphatase: 61 IU/L (ref 39–117)
BILIRUBIN TOTAL: 0.3 mg/dL (ref 0.0–1.2)
BUN / CREAT RATIO: 15 (ref 8–20)
BUN: 13 mg/dL (ref 6–20)
CO2: 25 mmol/L (ref 18–29)
Calcium: 9.5 mg/dL (ref 8.7–10.2)
Chloride: 98 mmol/L (ref 96–106)
Creatinine, Ser: 0.84 mg/dL (ref 0.57–1.00)
GFR calc non Af Amer: 89 mL/min/{1.73_m2} (ref >59)
GFR, EST AFRICAN AMERICAN: 103 mL/min/{1.73_m2} (ref >59)
Globulin, Total: 2.8 g/dL (ref 1.5–4.5)
Glucose: 91 mg/dL (ref 65–99)
Potassium: 4.5 mmol/L (ref 3.5–5.2)
SODIUM: 139 mmol/L (ref 134–144)
TOTAL PROTEIN: 7 g/dL (ref 6.0–8.5)

## 2015-12-01 LAB — CBC WITH DIFFERENTIAL/PLATELET
BASOS: 1 %
Basophils Absolute: 0.1 10*3/uL (ref 0.0–0.2)
EOS (ABSOLUTE): 0.5 10*3/uL — AB (ref 0.0–0.4)
Eos: 6 %
Hematocrit: 34.6 % (ref 34.0–46.6)
Hemoglobin: 11.1 g/dL (ref 11.1–15.9)
Immature Grans (Abs): 0 10*3/uL (ref 0.0–0.1)
Immature Granulocytes: 0 %
Lymphocytes Absolute: 2.8 10*3/uL (ref 0.7–3.1)
Lymphs: 33 %
MCH: 26.8 pg (ref 26.6–33.0)
MCHC: 32.1 g/dL (ref 31.5–35.7)
MCV: 84 fL (ref 79–97)
MONOS ABS: 0.5 10*3/uL (ref 0.1–0.9)
Monocytes: 6 %
NEUTROS ABS: 4.4 10*3/uL (ref 1.4–7.0)
Neutrophils: 54 %
PLATELETS: 452 10*3/uL — AB (ref 150–379)
RBC: 4.14 x10E6/uL (ref 3.77–5.28)
RDW: 13.3 % (ref 12.3–15.4)
WBC: 8.3 10*3/uL (ref 3.4–10.8)

## 2015-12-01 LAB — TSH: TSH: 0.899 u[IU]/mL (ref 0.450–4.500)

## 2015-12-01 LAB — PARATHYROID HORMONE, INTACT (NO CA)

## 2015-12-01 LAB — VITAMIN D 25 HYDROXY (VIT D DEFICIENCY, FRACTURES): Vit D, 25-Hydroxy: 26.4 ng/mL — ABNORMAL LOW (ref 30.0–100.0)

## 2015-12-02 LAB — PARATHYROID HORMONE, INTACT (NO CA): PTH: 17 pg/mL (ref 15–65)

## 2015-12-04 ENCOUNTER — Encounter: Payer: Self-pay | Admitting: Family Medicine

## 2015-12-04 NOTE — Telephone Encounter (Signed)
Routing to provider  

## 2015-12-10 ENCOUNTER — Ambulatory Visit (INDEPENDENT_AMBULATORY_CARE_PROVIDER_SITE_OTHER): Payer: BLUE CROSS/BLUE SHIELD

## 2015-12-10 ENCOUNTER — Encounter: Payer: Self-pay | Admitting: Family Medicine

## 2015-12-10 ENCOUNTER — Other Ambulatory Visit: Payer: Self-pay | Admitting: Family Medicine

## 2015-12-10 ENCOUNTER — Ambulatory Visit
Admission: EM | Admit: 2015-12-10 | Discharge: 2015-12-10 | Disposition: A | Discharge status: Home / Self Care | Payer: BLUE CROSS/BLUE SHIELD | Attending: Family Medicine | Admitting: Family Medicine

## 2015-12-10 DIAGNOSIS — J209 Acute bronchitis, unspecified: Secondary | ICD-10-CM | POA: Diagnosis not present

## 2015-12-10 DIAGNOSIS — R05 Cough: Secondary | ICD-10-CM | POA: Diagnosis not present

## 2015-12-10 DIAGNOSIS — Z86711 Personal history of pulmonary embolism: Secondary | ICD-10-CM | POA: Diagnosis not present

## 2015-12-10 DIAGNOSIS — R059 Cough, unspecified: Secondary | ICD-10-CM

## 2015-12-10 MED ORDER — ALBUTEROL SULFATE HFA 108 (90 BASE) MCG/ACT IN AERS: 2.0000 | INHALATION_SPRAY | RESPIRATORY_TRACT | Status: DC | PRN

## 2015-12-10 MED ORDER — FLUTICASONE-SALMETEROL 250-50 MCG/DOSE IN AEPB: 1.0000 | INHALATION_SPRAY | Freq: Two times a day (BID) | RESPIRATORY_TRACT | Status: DC

## 2015-12-10 MED ORDER — BENZONATATE 100 MG PO CAPS: 100.0000 mg | ORAL_CAPSULE | Freq: Three times a day (TID) | ORAL | Status: DC | PRN

## 2015-12-10 MED ORDER — HYDROCOD POLST-CPM POLST ER 10-8 MG/5ML PO SUER: 5.0000 mL | Freq: Two times a day (BID) | ORAL | Status: DC | PRN

## 2015-12-10 MED ORDER — AZITHROMYCIN 500 MG PO TABS: ORAL_TABLET | ORAL | Status: DC

## 2015-12-10 NOTE — ED Provider Notes (Signed)
CSN: [id number]     Arrival date & time 12/10/15  1427 History   First MD Initiated Contact with Patient 12/10/15 1537    Nurses notes were reviewed. Chief Complaint  Patient presents with  . Cough    Patient reports persistent cough. [facility] after several days of being [facility] because of marked and significant blood clots in the left leg and also pulmonary clots as well. She was on Lovenox she is on Elaquist now.  When she was seen in the ED on [place] [facility] she was placed on anabiotic name of. She was also she has some pleural effusions from laying flat and the previous PEs but no clear signs of infiltrate was noted. Since that she's had some recurrent coughing so she was concerned and came in. The chest pain and chest discomfort she had back on January 29 has cleared.  She has history of fibromyalgia chronic sinusitis as well as a PE. She was treated before she was diagnosed with PE for bronchitis with steroids which she states made her jittery and also albuterol inhaler explained to her that we may need to go back for albuterol inhaler and something for her lungs such as a combination or steroid inhaler inspiratory medication. Family medical history is positive for mother with thyroid disease and diabetes and father had cancer bladder. No children and she does not smoke.     (Consider location/radiation/quality/duration/timing/severity/associated sxs/prior Treatment) Patient is a 35 y.o. female presenting with shortness of breath. The history is provided by the patient. No language interpreter was used.  Shortness of Breath Severity:  Mild Onset quality:  Gradual Timing:  Constant Chronicity:  Recurrent Context: smoke exposure   Context: not strong odors and not URI   Relieved by:  Nothing Worsened by:  Nothing tried Ineffective treatments: Nothing currently now. Associated symptoms: cough   Associated symptoms: no chest pain, no fever, no headaches, no  hemoptysis, no sputum production and no swollen glands   Risk factors: hx of PE/DVT, oral contraceptive use and prolonged immobilization   Risk factors: no family hx of DVT and no tobacco use     Past Medical History  Diagnosis Date  . Chronic sinusitis   . Fibromyalgia   . Pulmonary embolism [facility])    Past Surgical History  Procedure Laterality Date  . Lytics catheter placement  11/16/15   Family History  Problem Relation Age of Onset  . Thyroid disease Mother   . Diabetes Mother   . Cancer Father     bladder  . Hypertension Maternal Grandmother   . Thyroid disease Paternal Grandmother   . Diabetes Paternal Grandmother   . Stroke Paternal Grandmother   . Cancer Paternal Grandfather     skin  . Heart disease Neg Hx   . COPD Neg Hx    Social History  Substance Use Topics  . Smoking status: Never Smoker   . Smokeless tobacco: Never Used  . Alcohol Use: No     Comment: rare   OB History    No data available     Review of Systems  Constitutional: Negative for fever.  Respiratory: Positive for cough and shortness of breath. Negative for hemoptysis and sputum production.   Cardiovascular: Negative for chest pain.  Neurological: Negative for headaches.    Allergies  Ortho tri-cyclen; Prednisone; and Augmentin  Home Medications   Prior to Admission medications   Medication Sig Start Date End Date Taking? Authorizing  Provider  montelukast (SINGULAIR) 10 MG tablet Take 1 tablet (10 mg total) by mouth at bedtime. 08/26/15  Yes Arnetha Courser, MD  pantoprazole (PROTONIX) 40 MG tablet Take 1 tablet (40 mg total) by mouth daily. 09/24/15  Yes Arnetha Courser, MD  acetaminophen (TYLENOL) 325 MG tablet Take 650 mg by mouth every 6 (six) hours as needed.    Historical Provider, MD  albuterol (PROVENTIL HFA;VENTOLIN HFA) 108 (90 Base) MCG/ACT inhaler Inhale 2 puffs into the lungs every 4 (four) hours as needed for wheezing or shortness of breath. 12/10/15   Frederich Cha, MD   azithromycin (ZITHROMAX) 500 MG tablet 1 tablet day for 5 days 12/10/15   Frederich Cha, MD  benzonatate (TESSALON PERLES) 100 MG capsule Take 1 capsule (100 mg total) by mouth every 8 (eight) hours as needed for cough. 12/10/15   Arnetha Courser, MD  calcium carbonate (TUMS - DOSED IN MG ELEMENTAL CALCIUM) 500 MG chewable tablet Chew 1 tablet by mouth as needed for indigestion or heartburn.    Historical Provider, MD  chlorpheniramine-HYDROcodone (TUSSIONEX PENNKINETIC ER) 10-8 MG/5ML SUER Take 5 mLs by mouth every 12 (twelve) hours as needed for cough. 12/10/15   Frederich Cha, MD  Fluticasone-Salmeterol (ADVAIR DISKUS) 250-50 MCG/DOSE AEPB Inhale 1 puff into the lungs 2 (two) times daily. 12/10/15   Frederich Cha, MD  ondansetron (ZOFRAN ODT) 4 MG disintegrating tablet Take 1 tablet (4 mg total) by mouth every 8 (eight) hours as needed for nausea or vomiting. 11/30/15   Arnetha Courser, MD  oxyCODONE-acetaminophen (PERCOCET/ROXICET) 5-325 MG tablet Take 1-2 tablets by mouth every 4 (four) hours as needed for moderate pain or severe pain.    Historical Provider, MD  polyethylene glycol (MIRALAX / GLYCOLAX) packet Take 17 g by mouth daily as needed.    Historical Provider, MD   Meds Ordered and Administered this Visit  Medications - No data to display  BP 107/55 mmHg  Pulse 89  Temp(Src) 98 F (36.7 C) (Oral)  Resp 18  SpO2 99%  LMP 11/16/2015 (Approximate) No data found.   Physical Exam  Constitutional: She is oriented to person, place, and time. She appears well-developed and well-nourished.  HENT:  Head: Normocephalic and atraumatic.  Right Ear: External ear normal.  Left Ear: External ear normal.  Eyes: Conjunctivae are normal. Pupils are equal, round, and reactive to light.  Neck: Normal range of motion. Neck supple. No tracheal deviation present. No thyromegaly present.  Cardiovascular: Normal rate, regular rhythm and normal heart sounds.   Pulmonary/Chest: Effort normal and breath sounds  normal.  Musculoskeletal: Normal range of motion. She exhibits no edema.  Neurological: She is alert and oriented to person, place, and time.  Skin: Skin is warm and dry.  Psychiatric: She has a normal mood and affect.  Vitals reviewed.  Try to reassure patient pulse ox is 99 she is not tachycardic she did ask about getting another chest x-ray the last one was about 2-3 weeks ago will acquiesce to her wishes and get another chest x-ray ED Course  Procedures (including critical care time)  Labs Review Labs Reviewed - No data to display  Imaging Review Dg Chest 2 View  12/10/2015  CLINICAL DATA:  Productive cough 1 month. Chest pain. Recent history of pulmonary embolism. EXAM: CHEST  2 VIEW COMPARISON:  11/22/2015 FINDINGS: The heart size and mediastinal contours are within normal limits. Both lungs are clear. No evidence of pneumothorax or pleural effusion. The visualized skeletal structures  are unremarkable. IMPRESSION: No active cardiopulmonary disease. Electronically Signed   By: Earle Gell M.D.   On: 12/10/2015 16:33     Visual Acuity Review  Right Eye Distance:   Left Eye Distance:   Bilateral Distance:    Right Eye Near:   Left Eye Near:    Bilateral Near:         MDM   1. Acute bronchitis, unspecified organism   2. Cough   3. Hx pulmonary embolism     We'll treat patient for bronchospasm/bronchitis. Chest x-ray shows no signs of acute illness or diseases time. Placed on Zithromax for 5 days test next cough I beat on inhaler as needed and Advair Diskus as well. Follow-up PCP in few days not better and consider continue with specialists as far as treatment of the PE and evaluation for possible shunt placement.    Frederich Cha, MD 12/10/15 [phone]

## 2015-12-10 NOTE — Telephone Encounter (Signed)
Patient said she went to urgent care

## 2015-12-10 NOTE — ED Notes (Signed)
Non-toxic appearing pt.  Reporting persistent cough.  Has "dry cough", isn't "able to bring anything up".

## 2015-12-10 NOTE — ED Notes (Signed)
Has cough she can't get rid of.  Recent admission to [facility] for blood clot - DC'd 26th.   Seen at [facility] on 26th and 29th and then was given diuretic to help with "fluid on lung".

## 2015-12-10 NOTE — Discharge Instructions (Signed)
Acute Bronchitis Bronchitis is when the airways that extend from the windpipe into the lungs get red, puffy, and painful (inflamed). Bronchitis often causes thick spit (mucus) to develop. This leads to a cough. A cough is the most common symptom of bronchitis. In acute bronchitis, the condition usually begins suddenly and goes away over time (usually in 2 weeks). Smoking, allergies, and asthma can make bronchitis worse. Repeated episodes of bronchitis may cause more lung problems. HOME CARE  Rest.  Drink enough fluids to keep your pee (urine) clear or pale yellow (unless you need to limit fluids as told by your doctor).  Only take over-the-counter or prescription medicines as told by your doctor.  Avoid smoking and secondhand smoke. These can make bronchitis worse. If you are a smoker, think about using nicotine gum or skin patches. Quitting smoking will help your lungs heal faster.  Reduce the chance of getting bronchitis again by:  Washing your hands often.  Avoiding people with cold symptoms.  Trying not to touch your hands to your mouth, nose, or eyes.  Follow up with your doctor as told. GET HELP IF: Your symptoms do not improve after 1 week of treatment. Symptoms include:  Cough.  Fever.  Coughing up thick spit.  Body aches.  Chest congestion.  Chills.  Shortness of breath.  Sore throat. GET HELP RIGHT AWAY IF:   You have an increased fever.  You have chills.  You have severe shortness of breath.  You have bloody thick spit (sputum).  You throw up (vomit) often.  You lose too much body fluid (dehydration).  You have a severe headache.  You faint. MAKE SURE YOU:   Understand these instructions.  Will watch your condition.  Will get help right away if you are not doing well or get worse.   This information is not intended to replace advice given to you by your health care provider. Make sure you discuss any questions you have with your health care  provider.   Document Released: 03/28/2008 Document Revised: 06/12/2013 Document Reviewed: 04/02/2013 Elsevier Interactive Patient Education 2016 [employer]  Cough, Adult A cough helps to clear your throat and lungs. A cough may last only 2-3 weeks (acute), or it may last longer than 8 weeks (chronic). Many different things can cause a cough. A cough may be a sign of an illness or another medical condition. HOME CARE  Pay attention to any changes in your cough.  Take medicines only as told by your doctor.  If you were prescribed an antibiotic medicine, take it as told by your doctor. Do not stop taking it even if you start to feel better.  Talk with your doctor before you try using a cough medicine.  Drink enough fluid to keep your pee (urine) clear or pale yellow.  If the air is dry, use a cold steam vaporizer or humidifier in your home.  Stay away from things that make you cough at work or at home.  If your cough is worse at night, try using extra pillows to raise your head up higher while you sleep.  Do not smoke, and try not to be around smoke. If you need help quitting, ask your doctor.  Do not have caffeine.  Do not drink alcohol.  Rest as needed. GET HELP IF:  You have new problems (symptoms).  You cough up yellow fluid (pus).  Your cough does not get better after 2-3 weeks, or your cough gets worse.  Medicine does not  help your cough and you are not sleeping well.  You have pain that gets worse or pain that is not helped with medicine.  You have a fever.  You are losing weight and you do not know why.  You have night sweats. GET HELP RIGHT AWAY IF:  You cough up blood.  You have trouble breathing.  Your heartbeat is very fast.   This information is not intended to replace advice given to you by your health care provider. Make sure you discuss any questions you have with your health care provider.   Document Released: 06/23/2011 Document Revised:  07/01/2015 Document Reviewed: 12/17/2014 Elsevier Interactive Patient Education [employer].

## 2015-12-11 ENCOUNTER — Encounter: Payer: Self-pay | Admitting: Emergency Medicine

## 2015-12-11 ENCOUNTER — Emergency Department
Admission: EM | Admit: 2015-12-11 | Discharge: 2015-12-11 | Disposition: A | Discharge status: Home / Self Care | Payer: BLUE CROSS/BLUE SHIELD | Attending: Emergency Medicine | Admitting: Emergency Medicine

## 2015-12-11 ENCOUNTER — Emergency Department: Payer: BLUE CROSS/BLUE SHIELD

## 2015-12-11 DIAGNOSIS — R0602 Shortness of breath: Secondary | ICD-10-CM | POA: Diagnosis present

## 2015-12-11 DIAGNOSIS — Z86711 Personal history of pulmonary embolism: Secondary | ICD-10-CM | POA: Diagnosis not present

## 2015-12-11 DIAGNOSIS — J45901 Unspecified asthma with (acute) exacerbation: Secondary | ICD-10-CM | POA: Insufficient documentation

## 2015-12-11 DIAGNOSIS — Z7951 Long term (current) use of inhaled steroids: Secondary | ICD-10-CM | POA: Insufficient documentation

## 2015-12-11 DIAGNOSIS — Z7901 Long term (current) use of anticoagulants: Secondary | ICD-10-CM | POA: Insufficient documentation

## 2015-12-11 DIAGNOSIS — Z79899 Other long term (current) drug therapy: Secondary | ICD-10-CM | POA: Diagnosis not present

## 2015-12-11 LAB — BASIC METABOLIC PANEL
ANION GAP: 8 (ref 5–15)
BUN: 16 mg/dL (ref 6–20)
CO2: 22 mmol/L (ref 22–32)
Calcium: 8.9 mg/dL (ref 8.9–10.3)
Chloride: 108 mmol/L (ref 101–111)
Creatinine, Ser: 0.8 mg/dL (ref 0.44–1.00)
Glucose, Bld: 99 mg/dL (ref 65–99)
POTASSIUM: 3.9 mmol/L (ref 3.5–5.1)
SODIUM: 138 mmol/L (ref 135–145)

## 2015-12-11 LAB — CBC
HEMATOCRIT: 35.5 % (ref 35.0–47.0)
HEMOGLOBIN: 11.8 g/dL — AB (ref 12.0–16.0)
MCH: 27.2 pg (ref 26.0–34.0)
MCHC: 33.2 g/dL (ref 32.0–36.0)
MCV: 81.9 fL (ref 80.0–100.0)
Platelets: 211 10*3/uL (ref 150–440)
RBC: 4.34 MIL/uL (ref 3.80–5.20)
RDW: 13.5 % (ref 11.5–14.5)
WBC: 9.1 10*3/uL (ref 3.6–11.0)

## 2015-12-11 LAB — TROPONIN I: Troponin I: 0.03 ng/mL (ref <0.031)

## 2015-12-11 NOTE — ED Provider Notes (Signed)
[facility] Emergency Department Provider Note   ____________________________________________  Time seen: Approximately 9:30 PM I have reviewed the triage vital signs and the triage nursing note.  HISTORY  Chief Complaint Dizziness and Palpitations   Historian Patient  HPI Ana Knapp is a 35 y.o. female with a history of fairly recently diagnosed left leg DVT up into the abdomen as well as bilateral PEs, for which she spent a hospital stay at [facility] in January, is here for complaint of shortness of breath and palpitations.  She did have thrombectomy of the left leg DVT in January. She left the hospital on Lovenox for 2 weeks before transitioning onto by mouth Eloquis.  After she left the hospital she was seen in the emergency department on 1-27 and 1-29. During those emergency department visit she was complaining of the same symptoms of shortness of breath and palpitations and had a repeat chest CT which showed overall improved clot burden with some bilateral distal PEs.  She was diagnosed on the second visit with possible pulmonary infection of small pleural effusions and placed on azithromycin.  Early in February she saw her primary care physician for persistent shortness of breath symptoms and it sounds like was started on albuterol inhaler for bronchitis.  Yesterday she was seen again at the primary care office for persistent symptoms especially shortness of breath with exertion and was felt to have bronchitis.      Past Medical History  Diagnosis Date  . Chronic sinusitis   . Fibromyalgia   . Pulmonary embolism [facility])     Patient Active Problem List   Diagnosis Date Noted  . Anemia 11/30/2015  . Hyperphosphatemia 11/30/2015  . Low serum calcium 11/30/2015  . Weight loss, unintentional 11/30/2015  . May-Thurner syndrome 11/30/2015  . Asthma exacerbation attacks 11/04/2015  . Sinus problem 10/13/2015  . Abdominal pain, chronic, epigastric  09/28/2015  . GERD (gastroesophageal reflux disease) 09/28/2015  . Oral contraceptive prescribed 09/28/2015  . Flushing 09/24/2015  . Preventative health care 08/30/2015  . Fibromyalgia 08/30/2015  . Seasonal allergic rhinitis 08/30/2015  . Hair loss 08/30/2015    Past Surgical History  Procedure Laterality Date  . Lytics catheter placement  11/16/15    Current Outpatient Rx  Name  Route  Sig  Dispense  Refill  . Apixaban (ELIQUIS PO)   Oral   Take by mouth.         Marland Kitchen acetaminophen (TYLENOL) 325 MG tablet   Oral   Take 650 mg by mouth every 6 (six) hours as needed.         Marland Kitchen albuterol (PROVENTIL HFA;VENTOLIN HFA) 108 (90 Base) MCG/ACT inhaler   Inhalation   Inhale 2 puffs into the lungs every 4 (four) hours as needed for wheezing or shortness of breath.   1 Inhaler   0   . azithromycin (ZITHROMAX) 500 MG tablet      1 tablet day for 5 days   5 tablet   0   . benzonatate (TESSALON PERLES) 100 MG capsule   Oral   Take 1 capsule (100 mg total) by mouth every 8 (eight) hours as needed for cough.   30 capsule   0   . calcium carbonate (TUMS - DOSED IN MG ELEMENTAL CALCIUM) 500 MG chewable tablet   Oral   Chew 1 tablet by mouth as needed for indigestion or heartburn.         . chlorpheniramine-HYDROcodone (TUSSIONEX PENNKINETIC ER) 10-8 MG/5ML SUER   Oral  Take 5 mLs by mouth every 12 (twelve) hours as needed for cough.   115 mL   0   . Fluticasone-Salmeterol (ADVAIR DISKUS) 250-50 MCG/DOSE AEPB   Inhalation   Inhale 1 puff into the lungs 2 (two) times daily.   1 each   0   . montelukast (SINGULAIR) 10 MG tablet   Oral   Take 1 tablet (10 mg total) by mouth at bedtime.   30 tablet   3   . ondansetron (ZOFRAN ODT) 4 MG disintegrating tablet   Oral   Take 1 tablet (4 mg total) by mouth every 8 (eight) hours as needed for nausea or vomiting.   20 tablet   0   . oxyCODONE-acetaminophen (PERCOCET/ROXICET) 5-325 MG tablet   Oral   Take 1-2 tablets by  mouth every 4 (four) hours as needed for moderate pain or severe pain.         . pantoprazole (PROTONIX) 40 MG tablet   Oral   Take 1 tablet (40 mg total) by mouth daily.   30 tablet   2   . polyethylene glycol (MIRALAX / GLYCOLAX) packet   Oral   Take 17 g by mouth daily as needed.           Allergies Ortho tri-cyclen; Prednisone; and Augmentin  Family History  Problem Relation Age of Onset  . Thyroid disease Mother   . Diabetes Mother   . Cancer Father     bladder  . Hypertension Maternal Grandmother   . Thyroid disease Paternal Grandmother   . Diabetes Paternal Grandmother   . Stroke Paternal Grandmother   . Cancer Paternal Grandfather     skin  . Heart disease Neg Hx   . COPD Neg Hx     Social History Social History  Substance Use Topics  . Smoking status: Never Smoker   . Smokeless tobacco: Never Used  . Alcohol Use: No     Comment: rare    Review of Systems  Constitutional: Negative for fever. Eyes: Negative for visual changes. ENT: Negative for sore throat. Cardiovascular: Negative for chest pain. Positive for occasional palpitations. Respiratory: Positive for shortness of breath. Occasional cough, nonproductive. History of seasonal allergies. Gastrointestinal: Negative for abdominal pain, vomiting and diarrhea. Genitourinary: Negative for dysuria. Musculoskeletal: Negative for back pain. Skin: Negative for rash. Neurological: Negative for headache. 10 point Review of Systems otherwise negative ____________________________________________   PHYSICAL EXAM:  VITAL SIGNS: ED Triage Vitals  Enc Vitals Group     BP 12/11/15 2004 126/76 mmHg     Pulse Rate 12/11/15 2004 115     Resp 12/11/15 2004 16     Temp 12/11/15 2004 98.8 F (37.1 C)     Temp Source 12/11/15 2004 Oral     SpO2 12/11/15 2004 96 %     Weight 12/11/15 2004 144 lb (65.318 kg)     Height 12/11/15 2004 5\' 9"  (1.753 m)     Head Cir --      Peak Flow --      Pain Score  12/11/15 2004 4     Pain Loc --      Pain Edu? --      Excl. in [place]? --      Constitutional: Alert and oriented. Well appearing and in no distress. HEENT   Head: Normocephalic and atraumatic.      Eyes: Conjunctivae are normal. PERRL. Normal extraocular movements.      Ears:  Nose: No congestion/rhinnorhea.   Mouth/Throat: Mucous membranes are moist.   Neck: No stridor. Cardiovascular/Chest: Normal rate, regular rhythm.  No murmurs, rubs, or gallops. Respiratory: Normal respiratory effort without tachypnea nor retractions. Good air movement throughout all fields. Very tiny end expiratory wheeze over the right posterior mid lung area. Gastrointestinal: Soft. No distention, no guarding, no rebound. Nontender.    Genitourinary/rectal:Deferred Musculoskeletal: Nontender with normal range of motion in all extremities. No joint effusions.  No lower extremity tenderness.  No edema. Neurologic:  Normal speech and language. No gross or focal neurologic deficits are appreciated. Skin:  Skin is warm, dry and intact. No rash noted. Psychiatric: Mood and affect are normal. Speech and behavior are normal. Patient exhibits appropriate insight and judgment.  ____________________________________________   EKG I, Lisa Roca, MD, the attending physician have personally viewed and interpreted all ECGs.  90 bpm. Normal sinus rhythm. Narrow QRS. Normal axis. Normal ST and T-wave ____________________________________________  LABS (pertinent positives/negatives)  Basic metabolic panel within normal limits White blood count 9.1, hemoglobin 1.8 and platelet count 211  ____________________________________________  RADIOLOGY All Xrays were viewed by me. Imaging interpreted by Radiologist.  Chest 2 view: No acute cardio pulmonary disease. __________________________________________  PROCEDURES  Procedure(s) performed: None  Critical Care performed:  None  ____________________________________________   ED COURSE / ASSESSMENT AND PLAN  Pertinent labs & imaging results that were available during my care of the patient were reviewed by me and considered in my medical decision making (see chart for details).   Patient's oxygenation is 100% on room air. Her lungs sound good clinically, without any real significant wheezing. Her laboratory studies are reassuring with no elevated white blood cell count, negative troponin, normal elect lites and kidney function, and hemoglobin of 11.8.  It sounds to me like the patient's symptoms are actually a persistence of symptoms from the known bilateral PEs. It sounds like her symptoms of shortness of breath palpitations are actually not worsening, they've just been persistent. Patient states that she notices it most lying flat at night or with exertion. It does make her somewhat anxious.  After long discussion about persistence of blood clots at this point in time, she does understand that her symptoms are likely from the known PEs.  Given that she is not complaining of any worsening shortness of breath, passing out, pleuritic chest pain, and has no abnormal blood pressure, tachycardia, and 100% oxygenation on room air, I am not suspicious of new/worsening PE and do not think a repeat CT scan is warranted.  Patient feels much more comfortable after this discussion, and will follow up with her primary care physician.    CONSULTATIONS:   None   Patient / Family / Caregiver informed of clinical course, medical decision-making process, and agree with plan.   I discussed return precautions, follow-up instructions, and discharged instructions with patient and/or family.   ___________________________________________   FINAL CLINICAL IMPRESSION(S) / ED DIAGNOSES   Final diagnoses:  Shortness of breath   known history of bilateral pulmonary emboli            Note: This dictation was  prepared with Dragon dictation. Any transcriptional errors that result from this process are unintentional   Lisa Roca, MD 12/11/15 2205

## 2015-12-11 NOTE — ED Notes (Signed)
Pt states has felt dizzy and has felt "high heart rate" since 1800 today. Pt with history of "blood clot from my leg to my abdomen" in January. Pt is on eliquis currently. Pt states feels dizzy when she stands up. Skin pwd. resps unlabored. Pt denies shob, but states felt shob  Yesterday.

## 2015-12-11 NOTE — Discharge Instructions (Signed)
You were evaluated for palpitations and shortness of breath, and we discussed her exam and evaluation are reassuring in the emergency department. We discussed I suspect your ongoing blood clots in the lungs are responsible for your continued shortness of breath symptoms.  Return to the emergency department for fever, new or worsening shortness of breath, or certainly any new or worsening chest pain, or passing out.   Pulmonary Embolism A pulmonary embolism (PE) is a sudden blockage or decrease of blood flow in one lung or both lungs. Most blockages come from a blood clot that travels from the legs or the pelvis to the lungs. PE is a dangerous and potentially life-threatening condition if it is not treated right away. CAUSES A pulmonary embolism occurs most commonly when a blood clot travels from one of your veins to your lungs. Rarely, PE is caused by air, fat, amniotic fluid, or part of a tumor traveling through your veins to your lungs. RISK FACTORS A PE is more likely to develop in:  People who smoke.  People who areolder, especially over 38 years of age.  People who are overweight (obese).  People who sit or lie still for a long time, such as during long-distance travel (over 4 hours), bed rest, hospitalization, or during recovery from certain medical conditions like a stroke.  People who do not engage in much physical activity (sedentary lifestyle).  People who have chronic breathing disorders.  People whohave a personal or family history of blood clots or blood clotting disease.  People whohave peripheral vascular disease (PVD), diabetes, or some types of cancer.  People who haveheart disease, especially if the person had a recent heart attack or has congestive heart failure.  People who have neurological diseases that affect the legs (leg paresis).  People who have had a traumatic injury, such as breaking a hip or leg.  People whohave recently had major or lengthy  surgery, especially on the hip, knee, or abdomen.  People who have hada central line placed inside a large vein.  People who takemedicines that contain the hormone estrogen. These include birth control pills and hormone replacement therapy.  Pregnancy or during childbirth or the postpartum period. SIGNS AND SYMPTOMS  The symptoms of a PE usually start suddenly and include:  Shortness of breath while active or at rest.  Coughing or coughing up blood or blood-tinged mucus.  Chest pain that is often worse with deep breaths.  Rapid or irregular heartbeat.  Feeling light-headed or dizzy.  Fainting.  Feelinganxious.  Sweating. There may also be pain and swelling in a leg if that is where the blood clot started. These symptoms may represent a serious problem that is an emergency. Do not wait to see if the symptoms will go away. Get medical help right away. Call your local emergency services (911 in the [place] Do not drive yourself to the hospital. DIAGNOSIS Your health care provider will take a medical history and perform a physical exam. You may also have other tests, including:  Blood tests to assess the clotting properties of your blood, assess oxygen levels in your blood, and find blood clots.  Imaging tests, such as CT, ultrasound, MRI, X-ray, and other tests to see if you have clots anywhere in your body.  An electrocardiogram (ECG) to look for heart strain from blood clots in the lungs. TREATMENT The main goals of PE treatment are:  To stop a blood clot from growing larger.  To stop new blood clots from forming. The  type of treatment that you receive depends on many factors, such as the cause of your PE, your risk for bleeding or developing more clots, and other medical conditions that you have. Sometimes, a combination of treatments is necessary. This condition may be treated with:  Medicines, including newer oral blood thinners (anticoagulants), warfarin, low  molecular weight heparins, thrombolytics, or heparins.  Wearing compression stockings or using different types of devices.  Surgery (rare) to remove the blood clot or to place a filter in your abdomen to stop the blood clot from traveling to your lungs. Treatments for a PE are often divided into immediate treatment, long-term treatment (up to 3 months after PE), and extended treatment (more than 3 months after PE). Your treatment may continue for several months. This is called maintenance therapy, and it is used to prevent the forming of new blood clots. You can work with your health care provider to choose the treatment program that is best for you. What are anticoagulants? Anticoagulants are medicines that treat PEs. They can stop current blood clots from growing and stop new clots from forming. They cannot dissolve existing clots. Your body dissolves clots by itself over time. Anticoagulants are given by mouth, by injection, or through an IV tube. What are thrombolytics? Thrombolytics are clot-dissolving medicines that are used to dissolve a PE. They carry a high risk of bleeding, so they tend to be used only in severe cases or if you have very low blood pressure. HOME CARE INSTRUCTIONS If you are taking a newer oral anticoagulant:  Take the medicine every single day at the same time each day.  Understand what foods and drugs interact with this medicine.  Understand that there are no regular blood tests required when using this medicine.  Understandthe side effects of this medicine, including excessive bruising or bleeding. Ask your health care provider or pharmacist about other possible side effects. If you are taking warfarin:  Understand how to take warfarin and know which foods can affect how warfarin works in Veterinary surgeon.  Understand that it is dangerous to taketoo much or too little warfarin. Too much warfarin increases the risk of bleeding. Too little warfarin continues to allow  the risk for blood clots.  Follow your PT and INR blood testing schedule. The PT and INR results allow your health care provider to adjust your dose of warfarin. It is very important that you have your PT and INR tested as often as told by your health care provider.  Avoid major changes in your diet, or tell your health care provider before you change your diet. Arrange a visit with a registered dietitian to answer your questions. Many foods, especially foods that are high in vitamin K, can interfere with warfarin and affect the PT and INR results. Eat a consistent amount of foods that are high in vitamin K, such as:  Spinach, kale, broccoli, cabbage, collard greens, turnip greens, Brussels sprouts, peas, cauliflower, seaweed, and parsley.  Beef liver and pork liver.  Green tea.  Soybean oil.  Tell your health care provider about any and all medicines, vitamins, and supplements that you take, including aspirin and other over-the-counter anti-inflammatory medicines. Be especially cautious with aspirin and anti-inflammatory medicines. Do not take those before you ask your health care provider if it is safe to do so. This is important because many medicines can interfere with warfarin and affect the PT and INR results.  Do not start or stop taking any over-the-counter or prescription medicine unless  your health care provider or pharmacist tells you to do so. If you take warfarin, you will also need to do these things:  Hold pressure over cuts for longer than usual.  Tell your dentist and other health care providers that you are taking warfarin before you have any procedures in which bleeding may occur.  Avoid alcohol or drink very small amounts. Tell your health care provider if you change your alcohol intake.  Do not use tobacco products, including cigarettes, chewing tobacco, and e-cigarettes. If you need help quitting, ask your health care provider.  Avoid contact sports. General  Instructions  Take over-the-counter and prescription medicines only as told by your health care provider. Anticoagulant medicines can have side effects, including easy bruising and difficulty stopping bleeding. If you are prescribed an anticoagulant, you will also need to do these things:  Hold pressure over cuts for longer than usual.  Tell your dentist and other health care providers that you are taking anticoagulants before you have any procedures in which bleeding may occur.  Avoid contact sports.  Wear a medical alert bracelet or carry a medical alert card that says you have had a PE.  Ask your health care provider how soon you can go back to your normal activities. Stay active to prevent new blood clots from forming.  Make sure to exercise while traveling or when you have been sitting or standing for a long period of time. It is very important to exercise. Exercise your legs by walking or by tightening and relaxing your leg muscles often. Take frequent walks.  Wear compression stockings as told by your health care provider to help prevent more blood clots from forming.  Do not use tobacco products, including cigarettes, chewing tobacco, and e-cigarettes. If you need help quitting, ask your health care provider.  Keep all follow-up appointments with your health care provider. This is important. PREVENTION Take these actions to decrease your risk of developing another PE:  Exercise regularly. For at least 30 minutes every day, engage in:  Activity that involves moving your arms and legs.  Activity that encourages good blood flow through your body by increasing your heart rate.  Exercise your arms and legs every hour during long-distance travel (over 4 hours). Drink plenty of water and avoid drinking alcohol while traveling.  Avoid sitting or lying in bed for long periods of time without moving your legs.  Maintain a weight that is appropriate for your height. Ask your health  care provider what weight is healthy for you.  If you are a woman who is over 28 years of age, avoid unnecessary use of medicines that contain estrogen. These include birth control pills.  Do not smoke, especially if you take estrogen medicines. If you need help quitting, ask your health care provider.  If you are at very high risk for PE, wear compression stockings.  If you recently had a PE, have regularly scheduled ultrasound testing on your legs to check for new blood clots. If you are hospitalized, prevention measures may include:  Early walking after surgery, as soon as your health care provider says that it is safe.  Receiving anticoagulants to prevent blood clots. If you cannot take anticoagulants, other options may be available, such as wearing compression stockings or using different types of devices. SEEK IMMEDIATE MEDICAL CARE IF:  You have new or increased pain, swelling, or redness in an arm or leg.  You have numbness or tingling in an arm or leg.  You  have shortness of breath while active or at rest.  You have chest pain.  You have a rapid or irregular heartbeat.  You feel light-headed or dizzy.  You cough up blood.  You notice blood in your vomit, bowel movement, or urine.  You have a fever. These symptoms may represent a serious problem that is an emergency. Do not wait to see if the symptoms will go away. Get medical help right away. Call your local emergency services (911 in the [place] Do not drive yourself to the hospital.   This information is not intended to replace advice given to you by your health care provider. Make sure you discuss any questions you have with your health care provider.   Document Released: 10/07/2000 Document Revised: 07/01/2015 Document Reviewed: 02/04/2015 Elsevier Interactive Patient Education [employer].

## 2015-12-11 NOTE — ED Notes (Signed)
Report to elizabeth, rn. Pt to xray. Pt will be placed on cardiac montior by eddie, xray tech on arrival to room 9 post xray.

## 2015-12-14 NOTE — Assessment & Plan Note (Signed)
Diagnosed at [facility], vascular surgery; Jan 2017

## 2015-12-14 NOTE — Assessment & Plan Note (Signed)
Encouraged patient to complete the urine testing, previous 24 hour test still pending

## 2015-12-17 ENCOUNTER — Other Ambulatory Visit: Payer: Self-pay | Admitting: Family Medicine

## 2015-12-17 ENCOUNTER — Encounter: Payer: Self-pay | Admitting: Family Medicine

## 2015-12-17 NOTE — Telephone Encounter (Signed)
Note sent to patient

## 2015-12-18 ENCOUNTER — Encounter: Payer: Self-pay | Admitting: Family Medicine

## 2015-12-22 ENCOUNTER — Encounter: Payer: Self-pay | Admitting: Family Medicine

## 2015-12-23 HISTORY — PX: ANGIOPLASTY / STENTING FEMORAL: SUR30

## 2015-12-23 NOTE — Telephone Encounter (Signed)
[facility] staff: Please help her with the medical records request part of this note; thank you

## 2015-12-28 ENCOUNTER — Ambulatory Visit: Payer: BLUE CROSS/BLUE SHIELD | Admitting: Family Medicine

## 2015-12-31 ENCOUNTER — Telehealth: Payer: Self-pay | Admitting: Family Medicine

## 2015-12-31 NOTE — Telephone Encounter (Signed)
I sent this MyChart message to patient and it appears that it has not been read; please call patient...  OTC medicine suggesting    From  Arnetha Courser, MD   To  Ana Knapp   Sent  12/17/2015 10:48 PM     Greetings. I looked at your medicine list and I see singulair (montelukast), but I didn't see an anti-histamine such as plain allegra or claritin or zyrtec. I would suggest one of those. Another option which would be okay would be an over-the-counter nasal corticosteroid (NOT Afrin). Avoid decongestants. You can ask the pharmacist where you shop if you have any questions, as there are likely to be many, many items to choose from on the shelves.  Peace,  Dr. Sanda Klein

## 2016-01-01 NOTE — Telephone Encounter (Signed)
Patient has an appointment on Monday, will discuss then.

## 2016-01-04 ENCOUNTER — Ambulatory Visit (INDEPENDENT_AMBULATORY_CARE_PROVIDER_SITE_OTHER): Payer: BLUE CROSS/BLUE SHIELD | Admitting: Family Medicine

## 2016-01-04 ENCOUNTER — Encounter: Payer: Self-pay | Admitting: Family Medicine

## 2016-01-04 VITALS — BP 108/66 | HR 81 | Temp 98.2°F | Wt 151.0 lb

## 2016-01-04 DIAGNOSIS — K219 Gastro-esophageal reflux disease without esophagitis: Secondary | ICD-10-CM | POA: Diagnosis not present

## 2016-01-04 DIAGNOSIS — M797 Fibromyalgia: Secondary | ICD-10-CM | POA: Diagnosis not present

## 2016-01-04 DIAGNOSIS — I871 Compression of vein: Secondary | ICD-10-CM | POA: Diagnosis not present

## 2016-01-04 DIAGNOSIS — J302 Other seasonal allergic rhinitis: Secondary | ICD-10-CM | POA: Diagnosis not present

## 2016-01-04 MED ORDER — PANTOPRAZOLE SODIUM 40 MG PO TBEC: 40.0000 mg | DELAYED_RELEASE_TABLET | Freq: Every day | ORAL | Status: DC

## 2016-01-04 MED ORDER — MONTELUKAST SODIUM 10 MG PO TABS: 10.0000 mg | ORAL_TABLET | Freq: Every day | ORAL | Status: DC

## 2016-01-04 NOTE — Patient Instructions (Addendum)
Try melatonin 3 mg at the same time of night for 3 weeks to see if that helps reset your clock Do try to get plenty of sleep, 7-8 hours a night Avoid heartburn triggers

## 2016-01-04 NOTE — Progress Notes (Signed)
BP 108/66 mmHg  Pulse 81  Temp(Src) 98.2 F (36.8 C)  Wt 151 lb (68.493 kg)  SpO2 100%  LMP 12/14/2015 (Exact Date)   Subjective:    Patient ID: Ana Knapp, female    DOB: 01-Apr-1981, 35 y.o.   MRN: [id number]  HPI: [name] is a 35 y.o. female  Chief Complaint  Patient presents with  . Follow-up    recheck, she had another vascular procedure last week. She still has some swelling in her calf. Still can't bend over well.   She has had stent put in for her May-Thurner syndrome, and sees vacular doctor regularly; will see him for the next five years; it will be a long time before the leg gets better; may not be the same for the rest of her life she was told; she has some local pain and has oxycodone if it gets bad enough; it keeps better each day  She had some sneezing, and is now taking plain claritin; has humidifier now and allergy filter; no big changes at home, then remembered that she does have a new cat  She has gained a little weight; feels better; no blood in urine or stool  Fibromyalgia; not sleeping 100%; not sure about taking trazodone  Needs refills of PPI, heartburn is controlled with that, varies; some food triggers and stress (not getting paid right now); stress from that  Relevant past medical, surgical, social history reviewed Interim medical history since our last visit reviewed. Allergies and medications reviewed and updated.  Review of Systems Per HPI unless specifically indicated above     Objective:    BP 108/66 mmHg  Pulse 81  Temp(Src) 98.2 F (36.8 C)  Wt 151 lb (68.493 kg)  SpO2 100%  LMP 12/14/2015 (Exact Date)  Wt Readings from Last 3 Encounters:  01/04/16 151 lb (68.493 kg)  12/11/15 144 lb (65.318 kg)  11/30/15 146 lb (66.225 kg)    Physical Exam  Constitutional: She appears well-developed and well-nourished. No distress.  Cardiovascular: Normal rate and regular rhythm.   Pulmonary/Chest: Effort normal and breath sounds  normal. No respiratory distress. She has no wheezes.  Musculoskeletal: She exhibits no edema.       Left upper leg: She exhibits tenderness (minimal tenderness anteromedial left thigh; site of vascular access slightly bruised; no fluctuance; no erythema, no red streaks). She exhibits no swelling, no edema and no deformity.  Skin: Bruising (slight bruise at site of vascular access left thigh) noted.  Psychiatric: She has a normal mood and affect.  Good eye contact with examiner   Results for orders placed or performed during the hospital encounter of [id number]  Basic metabolic panel  Result Value Ref Range   Sodium 138 135 - 145 mmol/L   Potassium 3.9 3.5 - 5.1 mmol/L   Chloride 108 101 - 111 mmol/L   CO2 22 22 - 32 mmol/L   Glucose, Bld 99 65 - 99 mg/dL   BUN 16 6 - 20 mg/dL   Creatinine, Ser 0.80 0.44 - 1.00 mg/dL   Calcium 8.9 8.9 - 10.3 mg/dL   GFR calc non Af Amer >60 >60 mL/min   GFR calc Af Amer >60 >60 mL/min   Anion gap 8 5 - 15  CBC  Result Value Ref Range   WBC 9.1 3.6 - 11.0 K/uL   RBC 4.34 3.80 - 5.20 MIL/uL   Hemoglobin 11.8 (L) 12.0 - 16.0 g/dL   HCT 35.5 35.0 - 47.0 %   MCV  81.9 80.0 - 100.0 fL   MCH 27.2 26.0 - 34.0 pg   MCHC 33.2 32.0 - 36.0 g/dL   RDW 13.5 11.5 - 14.5 %   Platelets 211 150 - 440 K/uL  Troponin I  Result Value Ref Range   Troponin I 0.03 <0.031 ng/mL      Assessment & Plan:   Problem List Items Addressed This Visit      Cardiovascular and Mediastinum   May-Thurner syndrome - Primary    Seeing 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[facility]; recently underwent stent placement; on Eliquis; no OCPs; continues to have discomfort at vascular access site; she may address this with specialist      Relevant Medications   aspirin EC 81 MG tablet     Respiratory   Seasonal allergic rhinitis    Plain antihistamine, allergen filter        Digestive   GERD (gastroesophageal reflux disease)    Using PPI for control of symptoms; on factor Xa inhibitor; increased  stress lately      Relevant Medications   pantoprazole (PROTONIX) 40 MG tablet     Musculoskeletal and Integument   Fibromyalgia    Important to get adequate sleep with her fibromyalgia; suggested melatonin 3 mg same time of night for 3 weeks; consider trazodone which works well for many patients with fibro      Relevant Medications   aspirin EC 81 MG tablet      Follow up plan: Return if symptoms worsen or fail to improve.  An after-visit summary was printed and given to the patient at [place].  Please see the patient instructions which may contain other information and recommendations beyond what is mentioned above in the assessment and plan.

## 2016-01-06 ENCOUNTER — Encounter: Payer: Self-pay | Admitting: Family Medicine

## 2016-01-07 LAB — 5 HIAA, QUANTITATIVE, URINE, 24 HOUR
5 HIAA UR: 2.7 mg/L
5-HIAA,Quant.,24 Hr Urine: 5.7 mg/24 hr (ref 0.0–14.9)

## 2016-01-07 NOTE — Telephone Encounter (Signed)
I spoke with her, we just need to fax Monday's progress note to [place]. Do you mind finishing it so I can fax it to them? Thanks! [phone]

## 2016-01-08 NOTE — Assessment & Plan Note (Addendum)
Seeing 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[facility]; recently underwent stent placement; on Eliquis; no OCPs; continues to have discomfort at vascular access site; she may address this with specialist

## 2016-01-08 NOTE — Assessment & Plan Note (Signed)
Using PPI for control of symptoms; on factor Xa inhibitor; increased stress lately

## 2016-01-08 NOTE — Assessment & Plan Note (Signed)
Important to get adequate sleep with her fibromyalgia; suggested melatonin 3 mg same time of night for 3 weeks; consider trazodone which works well for many patients with fibro

## 2016-01-08 NOTE — Telephone Encounter (Signed)
Note faxed to [employer].

## 2016-01-08 NOTE — Telephone Encounter (Signed)
Ready, thank you.

## 2016-01-08 NOTE — Assessment & Plan Note (Signed)
Plain antihistamine, allergen filter

## 2016-03-04 ENCOUNTER — Encounter: Payer: Self-pay | Admitting: Emergency Medicine

## 2016-03-04 ENCOUNTER — Ambulatory Visit
Admission: EM | Admit: 2016-03-04 | Discharge: 2016-03-04 | Disposition: A | Discharge status: Home / Self Care | Payer: BLUE CROSS/BLUE SHIELD | Attending: Family Medicine | Admitting: Family Medicine

## 2016-03-04 DIAGNOSIS — B349 Viral infection, unspecified: Secondary | ICD-10-CM | POA: Diagnosis not present

## 2016-03-04 DIAGNOSIS — J069 Acute upper respiratory infection, unspecified: Secondary | ICD-10-CM | POA: Diagnosis not present

## 2016-03-04 DIAGNOSIS — J988 Other specified respiratory disorders: Secondary | ICD-10-CM

## 2016-03-04 DIAGNOSIS — J0100 Acute maxillary sinusitis, unspecified: Secondary | ICD-10-CM | POA: Diagnosis not present

## 2016-03-04 DIAGNOSIS — B9789 Other viral agents as the cause of diseases classified elsewhere: Secondary | ICD-10-CM

## 2016-03-04 MED ORDER — FLUTICASONE PROPIONATE 50 MCG/ACT NA SUSP: 2.0000 | Freq: Every day | NASAL | Status: DC

## 2016-03-04 MED ORDER — CEFUROXIME AXETIL 500 MG PO TABS: 500.0000 mg | ORAL_TABLET | Freq: Two times a day (BID) | ORAL | Status: DC

## 2016-03-04 MED ORDER — FLUTICASONE PROPIONATE 50 MCG/ACT NA SUSP
2.0000 | Freq: Every day | NASAL | Status: DC
Start: 2016-03-04 — End: 2016-03-04

## 2016-03-04 NOTE — ED Notes (Signed)
Patient c/o sinus pain and congestion, and headache that started yesterday.

## 2016-03-04 NOTE — Discharge Instructions (Signed)
Sinusitis, Adult Sinusitis is redness, soreness, and puffiness (inflammation) of the air pockets in the bones of your face (sinuses). The redness, soreness, and puffiness can cause air and mucus to get trapped in your sinuses. This can allow germs to grow and cause an infection.  HOME CARE   Drink enough fluids to keep your pee (urine) clear or pale yellow.  Use a humidifier in your home.  Run a hot shower to create steam in the bathroom. Sit in the bathroom with the door closed. Breathe in the steam 3-4 times a day.  Put a warm, moist washcloth on your face 3-4 times a day, or as told by your doctor.  Use salt water sprays (saline sprays) to wet the thick fluid in your nose. This can help the sinuses drain.  Only take medicine as told by your doctor. GET HELP RIGHT AWAY IF:   Your pain gets worse.  You have very bad headaches.  You are sick to your stomach (nauseous).  You throw up (vomit).  You are very sleepy (drowsy) all the time.  Your face is puffy (swollen).  Your vision changes.  You have a stiff neck.  You have trouble breathing. MAKE SURE YOU:   Understand these instructions.  Will watch your condition.  Will get help right away if you are not doing well or get worse.   This information is not intended to replace advice given to you by your health care provider. Make sure you discuss any questions you have with your health care provider.   Document Released: 03/28/2008 Document Revised: 10/31/2014 Document Reviewed: 05/15/2012 Elsevier Interactive Patient Education 2016 Elsevier Inc.  Upper Respiratory Infection, Adult Most upper respiratory infections (URIs) are caused by a virus. A URI affects the nose, throat, and upper air passages. The most common type of URI is often called "the common cold." HOME CARE   Take medicines only as told by your doctor.  Gargle warm saltwater or take cough drops to comfort your throat as told by your doctor.  Use a  warm mist humidifier or inhale steam from a shower to increase air moisture. This may make it easier to breathe.  Drink enough fluid to keep your pee (urine) clear or pale yellow.  Eat soups and other clear broths.  Have a healthy diet.  Rest as needed.  Go back to work when your fever is gone or your doctor says it is okay.  You may need to stay home longer to avoid giving your URI to others.  You can also wear a face mask and wash your hands often to prevent spread of the virus.  Use your inhaler more if you have asthma.  Do not use any tobacco products, including cigarettes, chewing tobacco, or electronic cigarettes. If you need help quitting, ask your doctor. GET HELP IF:  You are getting worse, not better.  Your symptoms are not helped by medicine.  You have chills.  You are getting more short of breath.  You have brown or red mucus.  You have yellow or brown discharge from your nose.  You have pain in your face, especially when you bend forward.  You have a fever.  You have puffy (swollen) neck glands.  You have pain while swallowing.  You have white areas in the back of your throat. GET HELP RIGHT AWAY IF:   You have very bad or constant:  Headache.  Ear pain.  Pain in your forehead, behind your eyes, and over  your cheekbones (sinus pain).  Chest pain.  You have long-lasting (chronic) lung disease and any of the following:  Wheezing.  Long-lasting cough.  Coughing up blood.  A change in your usual mucus.  You have a stiff neck.  You have changes in your:  Vision.  Hearing.  Thinking.  Mood. MAKE SURE YOU:   Understand these instructions.  Will watch your condition.  Will get help right away if you are not doing well or get worse.   This information is not intended to replace advice given to you by your health care provider. Make sure you discuss any questions you have with your health care provider.   Document Released:  03/28/2008 Document Revised: 02/24/2015 Document Reviewed: 01/15/2014 Elsevier Interactive Patient Education 2016 Elsevier Inc.  Viral Infections A virus is a type of germ. Viruses can cause:  Minor sore throats.  Aches and pains.  Headaches.  Runny nose.  Rashes.  Watery eyes.  Tiredness.  Coughs.  Loss of appetite.  Feeling sick to your stomach (nausea).  Throwing up (vomiting).  Watery poop (diarrhea). HOME CARE   Only take medicines as told by your doctor.  Drink enough water and fluids to keep your pee (urine) clear or pale yellow. Sports drinks are a good choice.  Get plenty of rest and eat healthy. Soups and broths with crackers or rice are fine. GET HELP RIGHT AWAY IF:   You have a very bad headache.  You have shortness of breath.  You have chest pain or neck pain.  You have an unusual rash.  You cannot stop throwing up.  You have watery poop that does not stop.  You cannot keep fluids down.  You or your child has a temperature by mouth above 102 F (38.9 C), not controlled by medicine.  Your baby is older than 3 months with a rectal temperature of 102 F (38.9 C) or higher.  Your baby is 34 months old or younger with a rectal temperature of 100.4 F (38 C) or higher. MAKE SURE YOU:   Understand these instructions.  Will watch this condition.  Will get help right away if you are not doing well or get worse.   This information is not intended to replace advice given to you by your health care provider. Make sure you discuss any questions you have with your health care provider.   Document Released: 09/22/2008 Document Revised: 01/02/2012 Document Reviewed: 03/18/2015 Elsevier Interactive Patient Education [employer].

## 2016-03-04 NOTE — ED Provider Notes (Signed)
CSN: [id number]     Arrival date & time 03/04/16  0907 History   First MD Initiated Contact with Patient 03/04/16 1014   Nurses notes were reviewed.  Chief Complaint  Patient presents with  . Facial Pain  . Nasal Congestion  . Headache    Patient's here because of facial pain nasal congestion and headache. Should be noted that she was fine until Wednesday when she started having some myalgia yesterday she has nasal congestion and pressure and she is here today on Friday to see if something can be done. She has a history is recurrent chronic sinusitis fibromyalgia and she's had a history of marked PE with stents applied and the abdomen in the last year. She reports is green when she blows out of her nostril but there is no fever. She does not smoke. Mother has thyroid disease and diabetes father with latter cancer. No other significant family medical history. She states she is allergic to prednisone orally but she can use a nasal spray. She is allergic to Augmentin and she was taken off of the combination antihistamines and pseudoephedrine because of pseudoephedrine cause her heart speed up. She is currently taking Claritin and Singulair as not taking Claritin-D the current D she states did work better for her sinuses.  Current birth control is condoms usage at this time     (Consider location/radiation/quality/duration/timing/severity/associated sxs/prior Treatment) Patient is a 35 y.o. female presenting with headaches and URI. The history is provided by the patient. No language interpreter was used.  Headache Pain location:  Frontal Quality:  Stabbing Radiates to:  Does not radiate Onset quality:  Sudden Duration:  3 days Timing:  Constant Progression:  Unable to specify Chronicity:  New Similar to prior headaches: yes   Associated symptoms: congestion, cough, facial pain and URI   Associated symptoms: no sore throat   URI Presenting symptoms: congestion, cough, facial pain and  rhinorrhea   Presenting symptoms: no sore throat   Severity:  Moderate Duration:  2 days Timing:  Constant Progression:  Worsening Chronicity:  New Relieved by:  Nothing Worsened by:  Nothing tried Ineffective treatments:  OTC medications and prescription medications Associated symptoms: headaches     Past Medical History  Diagnosis Date  . Chronic sinusitis   . Fibromyalgia   . Pulmonary embolism [facility])    Past Surgical History  Procedure Laterality Date  . Lytics catheter placement  11/16/15  . Angioplasty / stenting femoral  March 2017   Family History  Problem Relation Age of Onset  . Thyroid disease Mother   . Diabetes Mother   . Cancer Father     bladder  . Hypertension Maternal Grandmother   . Thyroid disease Paternal Grandmother   . Diabetes Paternal Grandmother   . Stroke Paternal Grandmother   . Cancer Paternal Grandfather     skin  . Heart disease Neg Hx   . COPD Neg Hx    Social History  Substance Use Topics  . Smoking status: Never Smoker   . Smokeless tobacco: Never Used  . Alcohol Use: No     Comment: rare   OB History    No data available     Review of Systems  HENT: Positive for congestion and rhinorrhea. Negative for sore throat.   Respiratory: Positive for cough.   Neurological: Positive for headaches.    Allergies  Ortho tri-cyclen; Prednisone; and Augmentin  Home Medications   Prior to Admission medications   Medication Sig Start Date  End Date Taking? Authorizing Provider  acetaminophen (TYLENOL) 325 MG tablet Take 650 mg by mouth every 6 (six) hours as needed.    Historical Provider, MD  Apixaban (ELIQUIS PO) Take 5 mg by mouth 2 (two) times daily.     Historical Provider, MD  aspirin EC 81 MG tablet Take 81 mg by mouth daily. 12/28/15 12/27/16  Historical Provider, MD  calcium carbonate (TUMS - DOSED IN MG ELEMENTAL CALCIUM) 500 MG chewable tablet Chew 1 tablet by mouth as needed for indigestion or heartburn.    Historical Provider,  MD  cefUROXime (CEFTIN) 500 MG tablet Take 1 tablet (500 mg total) by mouth 2 (two) times daily. May fill  between 05/15 & 03/24/2016 if needed. 03/07/16   Frederich Cha, MD  Cholecalciferol (D 1000) 1000 units capsule Take 1,000 Units by mouth daily.    Historical Provider, MD  fluticasone (FLONASE) 50 MCG/ACT nasal spray Place 2 sprays into both nostrils daily. 03/04/16   Frederich Cha, MD  loratadine (CLARITIN) 10 MG tablet Take 10 mg by mouth daily.    Historical Provider, MD  montelukast (SINGULAIR) 10 MG tablet Take 1 tablet (10 mg total) by mouth at bedtime. 01/04/16   Arnetha Courser, MD  ondansetron (ZOFRAN ODT) 4 MG disintegrating tablet Take 1 tablet (4 mg total) by mouth every 8 (eight) hours as needed for nausea or vomiting. 11/30/15   Arnetha Courser, MD  pantoprazole (PROTONIX) 40 MG tablet Take 1 tablet (40 mg total) by mouth daily. 01/04/16   Arnetha Courser, MD  polyethylene glycol (MIRALAX / GLYCOLAX) packet Take 17 g by mouth daily as needed.    Historical Provider, MD   Meds Ordered and Administered this Visit  Medications - No data to display  BP 128/78 mmHg  Pulse 105  Temp(Src) 99 F (37.2 C) (Oral)  Resp 16  Ht 5\' 9"  (1.753 m)  Wt 157 lb (71.215 kg)  BMI 23.17 kg/m2  SpO2 100%  LMP 02/18/2016 (Exact Date) No data found.   Physical Exam  Constitutional: She is oriented to person, place, and time. She appears well-developed.  HENT:  Head: Normocephalic and atraumatic.  Right Ear: Hearing, tympanic membrane, external ear and ear canal normal.  Left Ear: Hearing, tympanic membrane, external ear and ear canal normal.  Nose: Mucosal edema and rhinorrhea present. Right sinus exhibits maxillary sinus tenderness. Left sinus exhibits maxillary sinus tenderness.  Mouth/Throat: Uvula is midline. No uvula swelling. Posterior oropharyngeal erythema present.  Eyes: Conjunctivae are normal. Pupils are equal, round, and reactive to light.  Neck: Normal range of motion. Neck supple.   Cardiovascular: Normal rate and regular rhythm.   Pulmonary/Chest: Effort normal and breath sounds normal.  Musculoskeletal: Normal range of motion.  Lymphadenopathy:    She has cervical adenopathy.  Neurological: She is alert and oriented to person, place, and time.  Skin: Skin is warm and dry.  Psychiatric: She has a normal mood and affect.  Vitals reviewed.   ED Course  Procedures (including critical care time)  Labs Review Labs Reviewed - No data to display  Imaging Review No results found.   Visual Acuity Review  Right Eye Distance:   Left Eye Distance:   Bilateral Distance:    Right Eye Near:   Left Eye Near:    Bilateral Near:         MDM   1. Acute URI   2. Viral respiratory illness   3. Acute maxillary sinusitis, recurrence not specified  Explained patient that we don't treat sinus infections within 48 hours his symptoms onset. While her symptoms started altogether 72 hours with the generalized malaise that she felt this still felt too early to try treat sinus infection per current recommendations. She cannot take oral steroids should she can take nasal steroid sprays will place on Flonase 2 puffs each nostril daily. I want to change her Claritin to Claritin-D which she states is more effective but since she's had a PE they recommend no decongestants with pseudoephedrine due to the tachycardia that can occur so we'll hold off on that and keep on the Claritin and Singulair. She cannot take Augmentin but she can take amoxicillin I'm going to place her on Ceftin which will get better sinus coverage but will postdate this prescription for Monday she can fill if she still continues to have sinus congestion and pain and discomfort. This prescription will be dated for 05/ 15 until 03/24/2016.   Work note will be given for today and tomorrow. Follow-up PCP if not better in 1-2 weeks.  Note: This dictation was prepared with Dragon dictation along with smaller phrase  technology. Any transcriptional errors that result from this process are unintentional.    Frederich Cha, MD 03/04/16 1119

## 2016-03-24 ENCOUNTER — Other Ambulatory Visit: Payer: Self-pay | Admitting: Family Medicine

## 2016-04-22 DIAGNOSIS — I82409 Acute embolism and thrombosis of unspecified deep veins of unspecified lower extremity: Secondary | ICD-10-CM | POA: Diagnosis not present

## 2016-04-22 DIAGNOSIS — I871 Compression of vein: Secondary | ICD-10-CM | POA: Diagnosis not present

## 2016-04-22 DIAGNOSIS — Z7982 Long term (current) use of aspirin: Secondary | ICD-10-CM | POA: Diagnosis not present

## 2016-04-22 DIAGNOSIS — M797 Fibromyalgia: Secondary | ICD-10-CM | POA: Diagnosis not present

## 2016-04-22 DIAGNOSIS — J45909 Unspecified asthma, uncomplicated: Secondary | ICD-10-CM | POA: Diagnosis not present

## 2016-04-22 DIAGNOSIS — Z86711 Personal history of pulmonary embolism: Secondary | ICD-10-CM | POA: Diagnosis not present

## 2016-04-22 DIAGNOSIS — I2699 Other pulmonary embolism without acute cor pulmonale: Secondary | ICD-10-CM | POA: Diagnosis not present

## 2016-04-22 DIAGNOSIS — Z95828 Presence of other vascular implants and grafts: Secondary | ICD-10-CM | POA: Diagnosis not present

## 2016-04-22 DIAGNOSIS — I82502 Chronic embolism and thrombosis of unspecified deep veins of left lower extremity: Secondary | ICD-10-CM | POA: Diagnosis not present

## 2016-04-22 DIAGNOSIS — Z7901 Long term (current) use of anticoagulants: Secondary | ICD-10-CM | POA: Diagnosis not present

## 2016-04-22 DIAGNOSIS — M7989 Other specified soft tissue disorders: Secondary | ICD-10-CM | POA: Diagnosis not present

## 2016-04-22 DIAGNOSIS — K219 Gastro-esophageal reflux disease without esophagitis: Secondary | ICD-10-CM | POA: Diagnosis not present

## 2016-04-22 DIAGNOSIS — Z6823 Body mass index (BMI) 23.0-23.9, adult: Secondary | ICD-10-CM | POA: Diagnosis not present

## 2016-04-22 DIAGNOSIS — Z8709 Personal history of other diseases of the respiratory system: Secondary | ICD-10-CM | POA: Diagnosis not present

## 2016-05-01 ENCOUNTER — Other Ambulatory Visit: Payer: Self-pay | Admitting: Family Medicine

## 2016-06-03 ENCOUNTER — Telehealth: Payer: Self-pay | Admitting: Family Medicine

## 2016-06-03 ENCOUNTER — Encounter: Payer: Self-pay | Admitting: Family Medicine

## 2016-06-03 MED ORDER — PANTOPRAZOLE SODIUM 40 MG PO TBEC: 40.0000 mg | DELAYED_RELEASE_TABLET | Freq: Every day | ORAL | 0 refills | Status: DC | PRN

## 2016-06-03 NOTE — Progress Notes (Signed)
rx sent

## 2016-06-14 DIAGNOSIS — R490 Dysphonia: Secondary | ICD-10-CM | POA: Diagnosis not present

## 2016-06-14 DIAGNOSIS — R0600 Dyspnea, unspecified: Secondary | ICD-10-CM | POA: Diagnosis not present

## 2016-06-14 DIAGNOSIS — D649 Anemia, unspecified: Secondary | ICD-10-CM | POA: Diagnosis not present

## 2016-06-14 DIAGNOSIS — Z7982 Long term (current) use of aspirin: Secondary | ICD-10-CM | POA: Diagnosis not present

## 2016-06-14 DIAGNOSIS — J3081 Allergic rhinitis due to animal (cat) (dog) hair and dander: Secondary | ICD-10-CM | POA: Diagnosis not present

## 2016-06-14 DIAGNOSIS — R42 Dizziness and giddiness: Secondary | ICD-10-CM | POA: Diagnosis not present

## 2016-06-14 DIAGNOSIS — J45909 Unspecified asthma, uncomplicated: Secondary | ICD-10-CM | POA: Diagnosis not present

## 2016-06-14 DIAGNOSIS — J4530 Mild persistent asthma, uncomplicated: Secondary | ICD-10-CM | POA: Diagnosis not present

## 2016-06-14 DIAGNOSIS — R05 Cough: Secondary | ICD-10-CM | POA: Diagnosis not present

## 2016-06-14 DIAGNOSIS — R12 Heartburn: Secondary | ICD-10-CM | POA: Diagnosis not present

## 2016-06-14 DIAGNOSIS — I2699 Other pulmonary embolism without acute cor pulmonale: Secondary | ICD-10-CM | POA: Diagnosis not present

## 2016-06-14 DIAGNOSIS — Z8709 Personal history of other diseases of the respiratory system: Secondary | ICD-10-CM | POA: Diagnosis not present

## 2016-06-14 DIAGNOSIS — Z7901 Long term (current) use of anticoagulants: Secondary | ICD-10-CM | POA: Diagnosis not present

## 2016-06-14 DIAGNOSIS — K219 Gastro-esophageal reflux disease without esophagitis: Secondary | ICD-10-CM | POA: Diagnosis not present

## 2016-06-14 DIAGNOSIS — Z86711 Personal history of pulmonary embolism: Secondary | ICD-10-CM | POA: Diagnosis not present

## 2016-06-14 DIAGNOSIS — R918 Other nonspecific abnormal finding of lung field: Secondary | ICD-10-CM | POA: Diagnosis not present

## 2016-06-14 DIAGNOSIS — R609 Edema, unspecified: Secondary | ICD-10-CM | POA: Diagnosis not present

## 2016-06-14 DIAGNOSIS — R0982 Postnasal drip: Secondary | ICD-10-CM | POA: Diagnosis not present

## 2016-06-14 DIAGNOSIS — I82402 Acute embolism and thrombosis of unspecified deep veins of left lower extremity: Secondary | ICD-10-CM | POA: Diagnosis not present

## 2016-06-15 ENCOUNTER — Ambulatory Visit (INDEPENDENT_AMBULATORY_CARE_PROVIDER_SITE_OTHER): Payer: BLUE CROSS/BLUE SHIELD | Admitting: Family Medicine

## 2016-06-15 ENCOUNTER — Encounter: Payer: Self-pay | Admitting: Family Medicine

## 2016-06-15 DIAGNOSIS — R635 Abnormal weight gain: Secondary | ICD-10-CM | POA: Diagnosis not present

## 2016-06-15 DIAGNOSIS — R0602 Shortness of breath: Secondary | ICD-10-CM

## 2016-06-15 DIAGNOSIS — R1013 Epigastric pain: Secondary | ICD-10-CM

## 2016-06-15 DIAGNOSIS — R5383 Other fatigue: Secondary | ICD-10-CM | POA: Insufficient documentation

## 2016-06-15 DIAGNOSIS — D509 Iron deficiency anemia, unspecified: Secondary | ICD-10-CM

## 2016-06-15 DIAGNOSIS — R109 Unspecified abdominal pain: Secondary | ICD-10-CM | POA: Insufficient documentation

## 2016-06-15 LAB — HEPATIC FUNCTION PANEL
ALT: 13 U/L (ref 6–29)
AST: 15 U/L (ref 10–30)
Albumin: 4.1 g/dL (ref 3.6–5.1)
Alkaline Phosphatase: 28 U/L — ABNORMAL LOW (ref 33–115)
BILIRUBIN DIRECT: 0.1 mg/dL (ref <=0.2)
Indirect Bilirubin: 0.4 mg/dL (ref 0.2–1.2)
Total Bilirubin: 0.5 mg/dL (ref 0.2–1.2)
Total Protein: 6.8 g/dL (ref 6.1–8.1)

## 2016-06-15 LAB — T4, FREE: FREE T4: 1 ng/dL (ref 0.8–1.8)

## 2016-06-15 LAB — TSH: TSH: 1.32 mIU/L

## 2016-06-15 LAB — VITAMIN B12: Vitamin B-12: 388 pg/mL (ref 200–1100)

## 2016-06-15 MED ORDER — PANTOPRAZOLE SODIUM 40 MG PO TBEC: 40.0000 mg | DELAYED_RELEASE_TABLET | Freq: Every day | ORAL | 0 refills | Status: DC | PRN

## 2016-06-15 MED ORDER — MONTELUKAST SODIUM 10 MG PO TABS: 10.0000 mg | ORAL_TABLET | Freq: Every day | ORAL | 11 refills | Status: DC

## 2016-06-15 MED ORDER — ONDANSETRON 4 MG PO TBDP: 4.0000 mg | ORAL_TABLET | Freq: Three times a day (TID) | ORAL | 0 refills | Status: DC | PRN

## 2016-06-15 NOTE — Patient Instructions (Addendum)
Please call your doctor who ordered the test yesterday and ask if they are worried about the burr cells Let's get labs today Return the stool cards next week If you have not heard anything from my staff in a week about any orders/referrals/studies from today, please contact us here to follow-up ([phone] Follow-up with your lung doctor and the tests later this week

## 2016-06-15 NOTE — Telephone Encounter (Signed)
COMPLETED

## 2016-06-15 NOTE — Progress Notes (Signed)
BP 110/60   Pulse 95   Temp 98.9 F (37.2 C) (Oral)   Resp 14   Wt 158 lb 3.2 oz (71.8 kg)   LMP 06/08/2016 (Exact Date)   SpO2 96%   BMI 23.36 kg/m    Subjective:    Patient ID: Ana Knapp, female    DOB: 11-18-1980, 35 y.o.   MRN: [id number]  HPI: Ana Knapp is a 35 y.o. female  Chief Complaint  Patient presents with  . Medication Refill   She has been not feeling well, shortness of breath with exertion; sitting still is fine; she saw her lung doctor yesterday and told her about this; they did blood work yesterday; doctor told her the iron level was low, hemoglobin was 11; patient was instructed to start iron supplement 325 mg daily; going to get CXR and V/Q scan on Friday Taking blood thinners; not missing any doses Taking one aspirin a day too Having some leg swelling, especially when she has her periods; she thought about calling the vascular doctor but she didn't; goes back in September Having headaches throughout the day; drinking enough water; she has been decreasing her caffeine, but has not cut them out completely; trying to drink more water through the day She has put on a little weight; wants to get that off; eats a lot more Constipation; no blood in the stools; losing more hair since surgery but it's coming back Thyroid disease runs in the family; mother has it Having some abdominal pain, upper abdomen; little bit of nausea, taking zofran Sleeping okay  Depression screen [facility] 2/9 06/15/2016  Decreased Interest 0  Down, Depressed, Hopeless 0  PHQ - 2 Score 0   Relevant past medical, surgical, family and social history reviewed Past Medical History:  Diagnosis Date  . Chronic sinusitis   . Fibromyalgia   . Pulmonary embolism [facility])    Past Surgical History:  Procedure Laterality Date  . ANGIOPLASTY / STENTING FEMORAL  March 2017  . Lytics Catheter Placement  11/16/15   Family History  Problem Relation Age of Onset  . Thyroid disease Mother   .  Diabetes Mother   . Cancer Father     bladder  . Hypertension Maternal Grandmother   . Thyroid disease Paternal Grandmother   . Diabetes Paternal Grandmother   . Stroke Paternal Grandmother   . Cancer Paternal Grandfather     skin  . Heart disease Neg Hx   . COPD Neg Hx    Social History  Substance Use Topics  . Smoking status: Never Smoker  . Smokeless tobacco: Never Used  . Alcohol use No     Comment: rare   Interim medical history since last visit reviewed. Allergies and medications reviewed  Review of Systems Per HPI unless specifically indicated above     Objective:    BP 110/60   Pulse 95   Temp 98.9 F (37.2 C) (Oral)   Resp 14   Wt 158 lb 3.2 oz (71.8 kg)   LMP 06/08/2016 (Exact Date)   SpO2 96%   BMI 23.36 kg/m   Wt Readings from Last 3 Encounters:  06/15/16 158 lb 3.2 oz (71.8 kg)  03/04/16 157 lb (71.2 kg)  01/04/16 151 lb (68.5 kg)    Physical Exam  Constitutional: She appears well-developed and well-nourished. No distress.  Neck: No JVD present. No thyromegaly present.  Cardiovascular: Normal rate and regular rhythm.   Pulmonary/Chest: Effort normal and breath sounds normal. No respiratory distress. She  has no wheezes.  Abdominal: There is no hepatosplenomegaly. There is tenderness (mild upper abd discomfort). There is no guarding.  Musculoskeletal: She exhibits no edema.       Right upper leg: She exhibits no swelling and no edema.       Left upper leg: She exhibits no swelling and no edema.       Right lower leg: She exhibits no swelling and no edema.       Left lower leg: She exhibits no swelling and no edema.  Wearing compression stockings bilaterally  Skin: Skin is warm. No pallor.  Psychiatric: She has a normal mood and affect. Her mood appears not anxious. She does not exhibit a depressed mood.  Good eye contact with examiner       Assessment & Plan:   Problem List Items Addressed This Visit      Other   Shortness of breath     Being addressed by pullmonologist; patient has mild anemia; going to have V/Q scan this week; to ER if worsening      Microcytic hypochromic anemia    Reviewed CBC and iron panel from [facility]; start iron supplement, caution about constipation getting worse; stool cards given to be returned next week      Fatigue    Check labs today      Relevant Orders   Vitamin B12 (Completed)   VITAMIN D 25 Hydroxy (Vit-D Deficiency, Fractures) (Completed)   Abnormal weight gain    Check thyroid function      Relevant Orders   T4, free (Completed)   TSH (Completed)   Abdominal pain    Check labs; continue miralax; hydrate 64 ounces of water a day      Relevant Orders   Hepatic function panel (Completed)    Other Visit Diagnoses   None.     Follow up plan: Return in about 3 weeks (around 07/06/2016) for follow-up.  An after-visit summary was printed and given to the patient at [facility].  Please see the patient instructions which may contain other information and recommendations beyond what is mentioned above in the assessment and plan.  Meds ordered this encounter  Medications  . montelukast (SINGULAIR) 10 MG tablet    Sig: Take 1 tablet (10 mg total) by mouth at bedtime.    Dispense:  30 tablet    Refill:  11  . pantoprazole (PROTONIX) 40 MG tablet    Sig: Take 1 tablet (40 mg total) by mouth daily as needed. Caution:prolonged use may increase risk of pneumonia, colitis, osteoporosis, anemia    Dispense:  30 tablet    Refill:  0  . ondansetron (ZOFRAN ODT) 4 MG disintegrating tablet    Sig: Take 1 tablet (4 mg total) by mouth every 8 (eight) hours as needed for nausea or vomiting.    Dispense:  20 tablet    Refill:  0    Orders Placed This Encounter  Procedures  . Hepatic function panel  . T4, free  . TSH  . Vitamin B12  . VITAMIN D 25 Hydroxy (Vit-D Deficiency, Fractures)

## 2016-06-15 NOTE — Assessment & Plan Note (Signed)
Check thyroid function 

## 2016-06-15 NOTE — Assessment & Plan Note (Signed)
Check labs today.

## 2016-06-15 NOTE — Assessment & Plan Note (Signed)
Reviewed CBC and iron panel from [facility]; start iron supplement, caution about constipation getting worse; stool cards given to be returned next week

## 2016-06-15 NOTE — Assessment & Plan Note (Addendum)
Check labs; continue miralax; hydrate 64 ounces of water a day

## 2016-06-16 LAB — VITAMIN D 25 HYDROXY (VIT D DEFICIENCY, FRACTURES): Vit D, 25-Hydroxy: 26 ng/mL — ABNORMAL LOW (ref 30–100)

## 2016-06-17 DIAGNOSIS — R918 Other nonspecific abnormal finding of lung field: Secondary | ICD-10-CM | POA: Diagnosis not present

## 2016-06-17 DIAGNOSIS — Z86711 Personal history of pulmonary embolism: Secondary | ICD-10-CM | POA: Diagnosis not present

## 2016-06-17 DIAGNOSIS — R0602 Shortness of breath: Secondary | ICD-10-CM | POA: Diagnosis not present

## 2016-06-17 DIAGNOSIS — I2699 Other pulmonary embolism without acute cor pulmonale: Secondary | ICD-10-CM | POA: Diagnosis not present

## 2016-06-19 DIAGNOSIS — R0602 Shortness of breath: Secondary | ICD-10-CM | POA: Insufficient documentation

## 2016-06-19 NOTE — Assessment & Plan Note (Signed)
Being addressed by pullmonologist; patient has mild anemia; going to have V/Q scan this week; to ER if worsening

## 2016-07-19 ENCOUNTER — Ambulatory Visit: Payer: BLUE CROSS/BLUE SHIELD | Admitting: Family Medicine

## 2016-07-22 DIAGNOSIS — K219 Gastro-esophageal reflux disease without esophagitis: Secondary | ICD-10-CM | POA: Diagnosis not present

## 2016-07-22 DIAGNOSIS — I871 Compression of vein: Secondary | ICD-10-CM | POA: Diagnosis not present

## 2016-07-22 DIAGNOSIS — Z7982 Long term (current) use of aspirin: Secondary | ICD-10-CM | POA: Diagnosis not present

## 2016-07-22 DIAGNOSIS — R911 Solitary pulmonary nodule: Secondary | ICD-10-CM | POA: Diagnosis not present

## 2016-07-22 DIAGNOSIS — J984 Other disorders of lung: Secondary | ICD-10-CM | POA: Diagnosis not present

## 2016-07-22 DIAGNOSIS — Z86718 Personal history of other venous thrombosis and embolism: Secondary | ICD-10-CM | POA: Diagnosis not present

## 2016-07-22 DIAGNOSIS — D509 Iron deficiency anemia, unspecified: Secondary | ICD-10-CM | POA: Diagnosis not present

## 2016-07-22 DIAGNOSIS — Z86711 Personal history of pulmonary embolism: Secondary | ICD-10-CM | POA: Diagnosis not present

## 2016-07-22 DIAGNOSIS — Z95828 Presence of other vascular implants and grafts: Secondary | ICD-10-CM | POA: Diagnosis not present

## 2016-07-22 DIAGNOSIS — J45909 Unspecified asthma, uncomplicated: Secondary | ICD-10-CM | POA: Diagnosis not present

## 2016-07-22 DIAGNOSIS — R918 Other nonspecific abnormal finding of lung field: Secondary | ICD-10-CM | POA: Diagnosis not present

## 2016-07-22 DIAGNOSIS — M797 Fibromyalgia: Secondary | ICD-10-CM | POA: Diagnosis not present

## 2016-08-01 ENCOUNTER — Other Ambulatory Visit: Payer: Self-pay | Admitting: Family Medicine

## 2016-08-02 ENCOUNTER — Ambulatory Visit: Payer: BLUE CROSS/BLUE SHIELD | Admitting: Family Medicine

## 2016-08-03 NOTE — Telephone Encounter (Signed)
On eliquis; approved

## 2016-08-18 ENCOUNTER — Encounter: Payer: Self-pay | Admitting: Family Medicine

## 2016-08-18 ENCOUNTER — Ambulatory Visit (INDEPENDENT_AMBULATORY_CARE_PROVIDER_SITE_OTHER): Payer: BLUE CROSS/BLUE SHIELD | Admitting: Family Medicine

## 2016-08-18 VITALS — BP 102/60 | HR 89 | Temp 98.2°F | Resp 14 | Ht 69.0 in | Wt 156.0 lb

## 2016-08-18 DIAGNOSIS — E559 Vitamin D deficiency, unspecified: Secondary | ICD-10-CM | POA: Diagnosis not present

## 2016-08-18 DIAGNOSIS — D509 Iron deficiency anemia, unspecified: Secondary | ICD-10-CM

## 2016-08-18 DIAGNOSIS — Z3169 Encounter for other general counseling and advice on procreation: Secondary | ICD-10-CM

## 2016-08-18 DIAGNOSIS — M797 Fibromyalgia: Secondary | ICD-10-CM | POA: Diagnosis not present

## 2016-08-18 DIAGNOSIS — E538 Deficiency of other specified B group vitamins: Secondary | ICD-10-CM | POA: Diagnosis not present

## 2016-08-18 DIAGNOSIS — K5900 Constipation, unspecified: Secondary | ICD-10-CM

## 2016-08-18 DIAGNOSIS — I871 Compression of vein: Secondary | ICD-10-CM | POA: Diagnosis not present

## 2016-08-18 LAB — CBC WITH DIFFERENTIAL/PLATELET
BASOS ABS: 0 {cells}/uL (ref 0–200)
Basophils Relative: 0 %
EOS PCT: 4 %
Eosinophils Absolute: 272 cells/uL (ref 15–500)
HCT: 41.6 % (ref 35.0–45.0)
HEMOGLOBIN: 13.8 g/dL (ref 11.7–15.5)
LYMPHS ABS: 2176 {cells}/uL (ref 850–3900)
Lymphocytes Relative: 32 %
MCH: 27.5 pg (ref 27.0–33.0)
MCHC: 33.2 g/dL (ref 32.0–36.0)
MCV: 82.9 fL (ref 80.0–100.0)
MPV: 9.3 fL (ref 7.5–12.5)
Monocytes Absolute: 612 cells/uL (ref 200–950)
Monocytes Relative: 9 %
NEUTROS ABS: 3740 {cells}/uL (ref 1500–7800)
Neutrophils Relative %: 55 %
Platelets: 231 10*3/uL (ref 140–400)
RBC: 5.02 MIL/uL (ref 3.80–5.10)
RDW: 18.5 % — ABNORMAL HIGH (ref 11.0–15.0)
WBC: 6.8 10*3/uL (ref 3.8–10.8)

## 2016-08-18 LAB — FERRITIN: Ferritin: 19 ng/mL (ref 10–154)

## 2016-08-18 NOTE — Assessment & Plan Note (Signed)
- 

## 2016-08-18 NOTE — Assessment & Plan Note (Signed)
Adequate sleep is so important; continue graduated exercise

## 2016-08-18 NOTE — Patient Instructions (Addendum)
Okay to start a multiple vitamin, with at least 12 mcg of B12, or you can have the vitamin with 6 mcg of B12 and just take extra B12 alone a few days a week We'll get labs today If you have not heard anything from my staff in a week about any orders/referrals/studies from today, please contact us here to follow-up [phone] We'll be glad to refer you to OB if you choose

## 2016-08-18 NOTE — Assessment & Plan Note (Signed)
Check labs 

## 2016-08-18 NOTE — Assessment & Plan Note (Signed)
Explained that I would recommend she establish with an OB to talk about risk, whether or not anticoagulation during pregnancy would be indicated, etc, but that if she and her husband want to start a family, absolutely start thinking and talking to [facility]; offered prenatal vitamins which she declined, as they are not there yet

## 2016-08-18 NOTE — Assessment & Plan Note (Signed)
Level under 400, so I recommend supplementation; most multiple vitamins contain 6 mcg daily, so I'd recommend at least twice that if she can find a vitamin with higher amount of B12; if not, take a single multiple vitamin and an extra vit B12 a few days a week

## 2016-08-18 NOTE — Assessment & Plan Note (Signed)
Suspect related to iron; check iron and CBC and get her off of the iron supplementation if possible; continue foods rich in iron

## 2016-08-18 NOTE — Progress Notes (Signed)
BP 102/60 (BP Location: Left Arm, Patient Position: Sitting, Cuff Size: Normal)   Pulse 89   Temp 98.2 F (36.8 C) (Oral)   Resp 14   Ht 5\' 9"  (1.753 m)   Wt 156 lb (70.8 kg)   LMP 08/04/2016   SpO2 97%   BMI 23.04 kg/m    Subjective:    Patient ID: Ana Knapp, female    DOB: [birth year], 36 y.o.   MRN: [id number]  HPI: Ana Knapp is a 35 y.o. female  Chief Complaint  Patient presents with  . Follow-up    Since taking iron medication, no bowel movments    Patient is here for f/u She has had constipation before, but much worse since being on the iron pills Last BM was yesterday and it was a good evacuation; no blood in the stool Energy level is better; has fibromyalgia, cold weather is worse for her with that; sleeping well Vitamin D deficiency; she is not taking just 400 iu daily; vit D level in August was 26 Vitamin B12 was a little low, 388 in August; she forget to pick up any B12 but will start a multiple vitamin Weight gain from not going to the bathroom She asked about her risk of having a child in the future; she just got married; no thoughts of starting a family immediately, just wants to know if it's something that could be done with her history of the blood clots  Not taking Eliquis any more; they did an [place] and they said everything looked good; they said she still has clots in the lungs but they weren't worried about it; she sees vascular at [facility]; she had a scan on her lungs at [facility] Had CT scan Sept 29, 2017 and also venous duplex left leg ----------------------------------------------- EXAM: CT Chest without contrast  DATE: 07/22/2016 9:06 AM ACCESSION: [id number] UN DICTATED: 07/22/2016 9:24 AM INTERPRETATION LOCATION: [place]  CLINICAL INDICATION: 35 years old Female with -R91.1-Solitary pulmonary nodule  COMPARISON: CTA chest 11/12/2015.  TECHNIQUE: A spiral CT scan was obtained without IV contrast from the thoracic inlet through the  hemidiaphragms. Images were reconstructed in the axial plane.Coronal and sagittal reformatted images of the chest were also provided for further evaluation of the lung parenchyma.  FINDINGS:   Heart size is normal. No pericardial effusion. Normal caliber thoracic aorta.  No mediastinal, hilar, or axillary lymphadenopathy.  Scarring at the right greater than left lung bases compatible with evolution of prior infarctions. Unchanged 0.7 cm groundglass opacity in the right apex (4:31).  No pleural effusion or pneumothorax.  The partially visualized upper abdomen is unremarkable.  No acute osseous abnormalities. No lytic or blastic lesions. ---------------------------------------------------- Venous duplex:  Final Interpretation Right There is no evidence of obstruction proximal to the inguinal ligament or in the common femoral vein. Left Abnormalities consistent with the sequela of a prior venous obstructive process, with findings that appear chronic/long standing in nature are identified in the proximal veins, and small saphenous vein. Findings appear essentially unchanged compared to previous examination of 04/22/2016. Stent is patent  Electronically signed by [id number] Eldridge Dace Dr. on 07/22/2016 at 2:59:46 PM. -------------------------------------------------------  Depression screen [facility] 2/9 08/18/2016 06/15/2016  Decreased Interest 0 0  Down, Depressed, Hopeless 0 0  PHQ - 2 Score 0 0   Relevant past medical, surgical, family and social history reviewed Past Medical History:  Diagnosis Date  . Chronic sinusitis   . Fibromyalgia   . Pulmonary embolism ([place])  Past Surgical History:  Procedure Laterality Date  . ANGIOPLASTY / STENTING FEMORAL  March 2017  . Lytics Catheter Placement  11/16/15   Family History  Problem Relation Age of Onset  . Thyroid disease Mother   . Diabetes Mother   . Cancer Father     bladder  . Hypertension Maternal Grandmother   . Thyroid disease  Paternal Grandmother   . Diabetes Paternal Grandmother   . Stroke Paternal Grandmother   . Cancer Paternal Grandfather     skin  . Heart disease Neg Hx   . COPD Neg Hx    Social History  Substance Use Topics  . Smoking status: Never Smoker  . Smokeless tobacco: Never Used  . Alcohol use No     Comment: rare   Interim medical history since last visit reviewed. Allergies and medications reviewed  Review of Systems Per HPI unless specifically indicated above     Objective:    BP 102/60 (BP Location: Left Arm, Patient Position: Sitting, Cuff Size: Normal)   Pulse 89   Temp 98.2 F (36.8 C) (Oral)   Resp 14   Ht 5\' 9"  (1.753 m)   Wt 156 lb (70.8 kg)   LMP 08/04/2016   SpO2 97%   BMI 23.04 kg/m   Wt Readings from Last 3 Encounters:  08/18/16 156 lb (70.8 kg)  06/15/16 158 lb 3.2 oz (71.8 kg)  03/04/16 157 lb (71.2 kg)    Physical Exam  Constitutional: She appears well-developed and well-nourished. No distress.  Eyes: EOM are normal. No scleral icterus.  Neck: No thyromegaly present.  Cardiovascular: Normal rate.   Pulmonary/Chest: Effort normal.  Abdominal: She exhibits no distension.  Musculoskeletal:       Left lower leg: She exhibits no tenderness, no swelling and no edema.  Wearing compression stocking on left lower leg  Skin: No pallor.  No nailbed pallor; no palmar erythema  Psychiatric: She has a normal mood and affect. Her behavior is normal. Judgment and thought content normal. Her mood appears not anxious. She does not exhibit a depressed mood.   Results for orders placed or performed in visit on 06/15/16  Hepatic function panel  Result Value Ref Range   Total Bilirubin 0.5 0.2 - 1.2 mg/dL   Bilirubin, Direct 0.1 <=0.2 mg/dL   Indirect Bilirubin 0.4 0.2 - 1.2 mg/dL   Alkaline Phosphatase 28 (L) 33 - 115 U/L   AST 15 10 - 30 U/L   ALT 13 6 - 29 U/L   Total Protein 6.8 6.1 - 8.1 g/dL   Albumin 4.1 3.6 - 5.1 g/dL  T4, free  Result Value Ref Range    Free T4 1.0 0.8 - 1.8 ng/dL  TSH  Result Value Ref Range   TSH 1.32 mIU/L  Vitamin B12  Result Value Ref Range   Vitamin B-12 388 200 - 1,100 pg/mL  VITAMIN D 25 Hydroxy (Vit-D Deficiency, Fractures)  Result Value Ref Range   Vit D, 25-Hydroxy 26 (L) 30 - 100 ng/mL      Assessment & Plan:   Problem List Items Addressed This Visit      Cardiovascular and Mediastinum   May-Thurner syndrome    Followed by 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[facility]; reviewed CT scan and venous duplex; she has been taken off of Eliquis by vascular specialist; will want to consult with OB if she decides to get pregnant b/c of risk of recurrent clot and discuss anticoagulation during pregnancy; simply having had this blood clot,  though, in my opinion should not cause her to think that she should not try to have a child if she truly wants one        Digestive   Constipation    Suspect related to iron; check iron and CBC and get her off of the iron supplementation if possible; continue foods rich in iron        Other   Vitamin D deficiency    Last level was 26; she has been taking 400 iu but will start 1000 iu daily; warned her that too much can be dangerous, so avoid the 5000 and 10,000 iu OTC supplements      Vitamin B12 deficiency    Level under 400, so I recommend supplementation; most multiple vitamins contain 6 mcg daily, so I'd recommend at least twice that if she can find a vitamin with higher amount of B12; if not, take a single multiple vitamin and an extra vit B12 a few days a week      Pre-conception counseling    Explained that I would recommend she establish with an OB to talk about risk, whether or not anticoagulation during pregnancy would be indicated, etc, but that if she and her husband want to start a family, absolutely start thinking and talking to OB; offered prenatal vitamins which she declined, as they are not there yet      Microcytic hypochromic anemia - Primary    Check labs      Relevant  Medications   ferrous sulfate 325 (65 FE) MG tablet   Other Relevant Orders   CBC with Differential/Platelet   Ferritin   Iron   Iron Binding Cap (TIBC)   Fibromyalgia    Adequate sleep is so important; continue graduated exercise       Other Visit Diagnoses   None.      Follow up plan: No Follow-up on file.  An after-visit summary was printed and given to the patient at [place].  Please see the patient instructions which may contain other information and recommendations beyond what is mentioned above in the assessment and plan.  Meds ordered this encounter  Medications  . ferrous sulfate 325 (65 FE) MG tablet    Sig: Take 1 tablet by mouth daily.    Orders Placed This Encounter  Procedures  . CBC with Differential/Platelet  . Ferritin  . Iron  . Iron Binding Cap (TIBC)

## 2016-08-18 NOTE — Assessment & Plan Note (Addendum)
Last level was 26; she has been taking 400 iu but will start 1000 iu daily; warned her that too much can be dangerous, so avoid the 5000 and 10,000 iu OTC supplements

## 2016-08-18 NOTE — Assessment & Plan Note (Addendum)
Followed by 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[facility]; reviewed CT scan and venous duplex; she has been taken off of Eliquis by vascular specialist; will want to consult with OB if she decides to get pregnant b/c of risk of recurrent clot and discuss anticoagulation during pregnancy; simply having had this blood clot, though, in my opinion should not cause her to think that she should not try to have a child if she truly wants one

## 2016-08-19 LAB — IRON AND TIBC
%SAT: 52 % — ABNORMAL HIGH (ref 11–50)
Iron: 187 ug/dL (ref 40–190)
TIBC: 358 ug/dL (ref 250–450)
UIBC: 171 ug/dL (ref 125–400)

## 2016-08-30 DIAGNOSIS — R911 Solitary pulmonary nodule: Secondary | ICD-10-CM | POA: Diagnosis not present

## 2016-08-30 DIAGNOSIS — Z6824 Body mass index (BMI) 24.0-24.9, adult: Secondary | ICD-10-CM | POA: Diagnosis not present

## 2016-08-30 DIAGNOSIS — I2782 Chronic pulmonary embolism: Secondary | ICD-10-CM | POA: Diagnosis not present

## 2016-08-30 DIAGNOSIS — J45909 Unspecified asthma, uncomplicated: Secondary | ICD-10-CM | POA: Diagnosis not present

## 2016-09-22 ENCOUNTER — Other Ambulatory Visit: Payer: Self-pay | Admitting: Family Medicine

## 2016-09-22 MED ORDER — RANITIDINE HCL 150 MG PO TABS: 150.0000 mg | ORAL_TABLET | Freq: Two times a day (BID) | ORAL | 5 refills | Status: DC

## 2016-09-22 NOTE — Telephone Encounter (Signed)
Two month supply Rxd Oct 11th; she should not be out Please suggest ranitidine 150 mg BID for heartburn, reflux Avoid triggers

## 2016-10-24 DIAGNOSIS — Z85820 Personal history of malignant melanoma of skin: Secondary | ICD-10-CM

## 2016-10-24 HISTORY — PX: MELANOMA EXCISION: SHX5266

## 2016-10-24 HISTORY — DX: Personal history of malignant melanoma of skin: Z85.820

## 2016-10-28 ENCOUNTER — Telehealth: Payer: Self-pay | Admitting: Family Medicine

## 2016-10-28 DIAGNOSIS — Z6824 Body mass index (BMI) 24.0-24.9, adult: Secondary | ICD-10-CM | POA: Diagnosis not present

## 2016-10-28 DIAGNOSIS — F17200 Nicotine dependence, unspecified, uncomplicated: Secondary | ICD-10-CM | POA: Diagnosis not present

## 2016-10-28 DIAGNOSIS — I871 Compression of vein: Secondary | ICD-10-CM | POA: Diagnosis not present

## 2016-10-28 DIAGNOSIS — K219 Gastro-esophageal reflux disease without esophagitis: Secondary | ICD-10-CM | POA: Diagnosis not present

## 2016-10-28 DIAGNOSIS — J45909 Unspecified asthma, uncomplicated: Secondary | ICD-10-CM | POA: Diagnosis not present

## 2016-10-28 DIAGNOSIS — M7989 Other specified soft tissue disorders: Secondary | ICD-10-CM | POA: Diagnosis not present

## 2016-10-28 DIAGNOSIS — M797 Fibromyalgia: Secondary | ICD-10-CM | POA: Diagnosis not present

## 2016-10-28 DIAGNOSIS — Z7982 Long term (current) use of aspirin: Secondary | ICD-10-CM | POA: Diagnosis not present

## 2016-10-28 DIAGNOSIS — I82402 Acute embolism and thrombosis of unspecified deep veins of left lower extremity: Secondary | ICD-10-CM | POA: Diagnosis not present

## 2016-10-28 DIAGNOSIS — Z86711 Personal history of pulmonary embolism: Secondary | ICD-10-CM | POA: Diagnosis not present

## 2016-10-28 DIAGNOSIS — Z3169 Encounter for other general counseling and advice on procreation: Secondary | ICD-10-CM | POA: Insufficient documentation

## 2016-10-28 DIAGNOSIS — Z86718 Personal history of other venous thrombosis and embolism: Secondary | ICD-10-CM | POA: Diagnosis not present

## 2016-10-28 NOTE — Assessment & Plan Note (Signed)
Refer to OB °

## 2016-10-28 NOTE — Assessment & Plan Note (Signed)
Sees vascular doctor at [facility]; refer to [facility] for preconception counseling, management during future pregnancy

## 2016-10-28 NOTE — Telephone Encounter (Signed)
Absolutely; I just read the note from her vascular doctor and entered the referral to [facility]

## 2016-10-28 NOTE — Telephone Encounter (Signed)
Patient called wanting a referral to an Obstetrician because she would like to discuss having a child and her vascular provider told patient to contact PCP to initiate the referral. Patient has [employer].

## 2016-10-31 ENCOUNTER — Other Ambulatory Visit: Payer: Self-pay | Admitting: Family Medicine

## 2016-10-31 ENCOUNTER — Encounter: Payer: Self-pay | Admitting: Family Medicine

## 2016-10-31 MED ORDER — ONDANSETRON 4 MG PO TBDP: 4.0000 mg | ORAL_TABLET | Freq: Three times a day (TID) | ORAL | 0 refills | Status: DC | PRN

## 2016-10-31 MED ORDER — PANTOPRAZOLE SODIUM 40 MG PO TBEC: 40.0000 mg | DELAYED_RELEASE_TABLET | Freq: Every day | ORAL | 1 refills | Status: DC

## 2016-10-31 NOTE — Telephone Encounter (Signed)
Referral was place through the online portal at [facility]

## 2016-11-02 ENCOUNTER — Encounter: Payer: Self-pay | Admitting: Family Medicine

## 2016-11-08 DIAGNOSIS — K219 Gastro-esophageal reflux disease without esophagitis: Secondary | ICD-10-CM | POA: Diagnosis not present

## 2016-11-08 DIAGNOSIS — R918 Other nonspecific abnormal finding of lung field: Secondary | ICD-10-CM | POA: Diagnosis not present

## 2016-11-08 DIAGNOSIS — D509 Iron deficiency anemia, unspecified: Secondary | ICD-10-CM | POA: Diagnosis not present

## 2016-11-08 DIAGNOSIS — Z7982 Long term (current) use of aspirin: Secondary | ICD-10-CM | POA: Diagnosis not present

## 2016-11-08 DIAGNOSIS — M7989 Other specified soft tissue disorders: Secondary | ICD-10-CM | POA: Diagnosis not present

## 2016-11-08 DIAGNOSIS — R42 Dizziness and giddiness: Secondary | ICD-10-CM | POA: Diagnosis not present

## 2016-11-08 DIAGNOSIS — R0602 Shortness of breath: Secondary | ICD-10-CM | POA: Diagnosis not present

## 2016-11-08 DIAGNOSIS — R109 Unspecified abdominal pain: Secondary | ICD-10-CM | POA: Diagnosis not present

## 2016-11-08 DIAGNOSIS — Z86718 Personal history of other venous thrombosis and embolism: Secondary | ICD-10-CM | POA: Diagnosis not present

## 2016-11-08 DIAGNOSIS — R05 Cough: Secondary | ICD-10-CM | POA: Diagnosis not present

## 2016-11-08 DIAGNOSIS — Z86711 Personal history of pulmonary embolism: Secondary | ICD-10-CM | POA: Diagnosis not present

## 2016-11-08 DIAGNOSIS — R0789 Other chest pain: Secondary | ICD-10-CM | POA: Diagnosis not present

## 2016-11-08 DIAGNOSIS — R0600 Dyspnea, unspecified: Secondary | ICD-10-CM | POA: Diagnosis not present

## 2016-11-08 DIAGNOSIS — Z883 Allergy status to other anti-infective agents status: Secondary | ICD-10-CM | POA: Diagnosis not present

## 2016-11-08 DIAGNOSIS — Z79899 Other long term (current) drug therapy: Secondary | ICD-10-CM | POA: Diagnosis not present

## 2016-11-08 DIAGNOSIS — Z888 Allergy status to other drugs, medicaments and biological substances status: Secondary | ICD-10-CM | POA: Diagnosis not present

## 2016-11-08 DIAGNOSIS — J45909 Unspecified asthma, uncomplicated: Secondary | ICD-10-CM | POA: Diagnosis not present

## 2016-11-23 ENCOUNTER — Encounter: Payer: Self-pay | Admitting: Family Medicine

## 2016-11-23 DIAGNOSIS — R1011 Right upper quadrant pain: Secondary | ICD-10-CM

## 2016-11-24 ENCOUNTER — Encounter: Payer: Self-pay | Admitting: Family Medicine

## 2016-11-24 ENCOUNTER — Encounter: Payer: Self-pay | Admitting: *Deleted

## 2016-11-24 ENCOUNTER — Ambulatory Visit (INDEPENDENT_AMBULATORY_CARE_PROVIDER_SITE_OTHER): Payer: Self-pay | Admitting: Family Medicine

## 2016-11-24 ENCOUNTER — Ambulatory Visit
Admission: EM | Admit: 2016-11-24 | Discharge: 2016-11-24 | Disposition: A | Discharge status: Home / Self Care | Payer: BLUE CROSS/BLUE SHIELD | Attending: Family Medicine | Admitting: Family Medicine

## 2016-11-24 ENCOUNTER — Ambulatory Visit
Admit: 2016-11-24 | Discharge: 2016-11-24 | Disposition: A | Discharge status: Home / Self Care | Payer: BLUE CROSS/BLUE SHIELD | Attending: Family | Admitting: Family

## 2016-11-24 VITALS — BP 124/78 | HR 96 | Temp 99.0°F | Resp 14 | Wt 156.1 lb

## 2016-11-24 DIAGNOSIS — Z86711 Personal history of pulmonary embolism: Secondary | ICD-10-CM | POA: Diagnosis not present

## 2016-11-24 DIAGNOSIS — K5730 Diverticulosis of large intestine without perforation or abscess without bleeding: Secondary | ICD-10-CM | POA: Diagnosis not present

## 2016-11-24 DIAGNOSIS — R630 Anorexia: Secondary | ICD-10-CM | POA: Diagnosis not present

## 2016-11-24 DIAGNOSIS — R109 Unspecified abdominal pain: Secondary | ICD-10-CM

## 2016-11-24 DIAGNOSIS — R110 Nausea: Secondary | ICD-10-CM | POA: Diagnosis not present

## 2016-11-24 DIAGNOSIS — K219 Gastro-esophageal reflux disease without esophagitis: Secondary | ICD-10-CM | POA: Diagnosis not present

## 2016-11-24 DIAGNOSIS — J45909 Unspecified asthma, uncomplicated: Secondary | ICD-10-CM | POA: Diagnosis not present

## 2016-11-24 DIAGNOSIS — Z7982 Long term (current) use of aspirin: Secondary | ICD-10-CM | POA: Diagnosis not present

## 2016-11-24 DIAGNOSIS — R1011 Right upper quadrant pain: Secondary | ICD-10-CM | POA: Diagnosis not present

## 2016-11-24 DIAGNOSIS — K5900 Constipation, unspecified: Secondary | ICD-10-CM | POA: Diagnosis not present

## 2016-11-24 DIAGNOSIS — D509 Iron deficiency anemia, unspecified: Secondary | ICD-10-CM | POA: Diagnosis not present

## 2016-11-24 DIAGNOSIS — Z86718 Personal history of other venous thrombosis and embolism: Secondary | ICD-10-CM | POA: Diagnosis not present

## 2016-11-24 DIAGNOSIS — Z7901 Long term (current) use of anticoagulants: Secondary | ICD-10-CM | POA: Diagnosis not present

## 2016-11-24 DIAGNOSIS — Z79899 Other long term (current) drug therapy: Secondary | ICD-10-CM | POA: Diagnosis not present

## 2016-11-24 LAB — URINALYSIS, COMPLETE (UACMP) WITH MICROSCOPIC
BILIRUBIN URINE: NEGATIVE
Bacteria, UA: NONE SEEN
Glucose, UA: NEGATIVE mg/dL
Ketones, ur: NEGATIVE mg/dL
LEUKOCYTES UA: NEGATIVE
NITRITE: NEGATIVE
PROTEIN: NEGATIVE mg/dL
Specific Gravity, Urine: <1.005 — ABNORMAL LOW (ref 1.005–1.030)
pH: 5.5 (ref 5.0–8.0)

## 2016-11-24 MED ORDER — SUCRALFATE 1 GM/10ML PO SUSP: 1.0000 g | Freq: Three times a day (TID) | ORAL | 0 refills | Status: DC

## 2016-11-24 NOTE — Patient Instructions (Signed)
Please do go to the ER quickly and have them evaluate you (labs and imaging)

## 2016-11-24 NOTE — ED Provider Notes (Signed)
CSN: [id number]     Arrival date & time 11/24/16  [id number] History   First MD Initiated Contact with Patient 11/24/16 [phone]     Chief Complaint  Patient presents with  . Abdominal Pain  . Flank Pain   (Consider location/radiation/quality/duration/timing/severity/associated sxs/prior Treatment) 36 year old female presents with right sided flank pain that started 2 weeks ago. Now has spread to right upper abdomen. Feeling nauseous for the past few days as well as experiencing occasional diarrhea and dry-heaving yesterday. Has taken Zofran for nausea with some relief. Has been drinking water and was able to keep down a banana and some yogurt around 8am today. Felt like she had chills/fever a few nights ago but no fever today. Denies any URI symptoms, urinary discomfort or unusual vaginal discharge. Currently on her period. Has history of multiple chronic health issues, especially PE and DVT in the past year. Did go to [facility] about 2 weeks ago when flank pain started with concern over kidney infection but was negative per patient.    The history is provided by the patient.    Past Medical History:  Diagnosis Date  . Chronic sinusitis   . Fibromyalgia   . May-Thurner syndrome 11/30/2015   January 2017; 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[facility]   . Pulmonary embolism [facility])    Past Surgical History:  Procedure Laterality Date  . ANGIOPLASTY / STENTING FEMORAL  March 2017  . Lytics Catheter Placement  11/16/15   Family History  Problem Relation Age of Onset  . Thyroid disease Mother   . Diabetes Mother   . Cancer Father     bladder  . Hypertension Maternal Grandmother   . Thyroid disease Paternal Grandmother   . Diabetes Paternal Grandmother   . Stroke Paternal Grandmother   . Cancer Paternal Grandfather     skin  . Heart disease Neg Hx   . COPD Neg Hx    Social History  Substance Use Topics  . Smoking status: Never Smoker  . Smokeless tobacco: Never Used  . Alcohol use No     Comment: rare   OB  History    No data available     Review of Systems  Constitutional: Positive for appetite change, chills and fatigue. Negative for fever.  HENT: Negative for congestion.   Respiratory: Negative for cough and chest tightness.   Gastrointestinal: Positive for constipation, diarrhea and nausea. Negative for blood in stool. Vomiting: dry heaving.  Genitourinary: Negative for difficulty urinating, dysuria and vaginal discharge.  Musculoskeletal: Positive for arthralgias and myalgias.  Skin: Negative for rash.  Neurological: Positive for light-headedness. Negative for syncope and weakness.  Hematological: Negative for adenopathy.    Allergies  Ortho tri-cyclen [norgestimate-eth estradiol]; Prednisone; and Augmentin [amoxicillin-pot clavulanate]  Home Medications   Prior to Admission medications   Medication Sig Start Date End Date Taking? Authorizing Provider  aspirin EC 81 MG tablet Take 81 mg by mouth daily. 12/28/15 12/27/16 Yes Historical Provider, MD  calcium carbonate (TUMS - DOSED IN MG ELEMENTAL CALCIUM) 500 MG chewable tablet Chew 1 tablet by mouth as needed for indigestion or heartburn.   Yes Historical Provider, MD  loratadine (CLARITIN) 10 MG tablet Take 10 mg by mouth daily.   Yes Historical Provider, MD  montelukast (SINGULAIR) 10 MG tablet Take 1 tablet (10 mg total) by mouth at bedtime. 06/15/16  Yes Arnetha Courser, MD  ondansetron (ZOFRAN ODT) 4 MG disintegrating tablet Take 1 tablet (4 mg total) by mouth every 8 (eight)  hours as needed for nausea or vomiting. 10/31/16  Yes Arnetha Courser, MD  pantoprazole (PROTONIX) 40 MG tablet Take 1 tablet (40 mg total) by mouth daily. 10/31/16  Yes Arnetha Courser, MD  acetaminophen (TYLENOL) 325 MG tablet Take 650 mg by mouth every 6 (six) hours as needed.    Historical Provider, MD  Multiple Vitamin (MULTI-VITAMINS) TABS Take by mouth.    Historical Provider, MD  polyethylene glycol (MIRALAX / GLYCOLAX) packet Take 17 g by mouth daily as  needed.    Historical Provider, MD  sucralfate (CARAFATE) 1 GM/10ML suspension Take 10 mLs (1 g total) by mouth 4 (four) times daily -  with meals and at bedtime. 11/24/16   Katy Apo, NP   Meds Ordered and Administered this Visit  Medications - No data to display  BP 114/68 (BP Location: Left Arm)   Pulse 74   Temp 98 F (36.7 C) (Oral)   Resp 16   Ht 5\' 9"  (1.753 m)   Wt 154 lb (69.9 kg)   LMP 11/24/2016   SpO2 100%   BMI 22.74 kg/m  No data found.   Physical Exam  Constitutional: She is oriented to person, place, and time. She appears well-developed and well-nourished. She appears ill. No distress.  HENT:  Mouth/Throat: Oropharynx is clear and moist.  Neck: Normal range of motion. Neck supple.  Cardiovascular: Normal rate, regular rhythm and normal heart sounds.   No murmur heard. Pulmonary/Chest: Effort normal and breath sounds normal. No respiratory distress. She has no decreased breath sounds. She has no wheezes. She has no rales.  Abdominal: Soft. Normal appearance and bowel sounds are normal. There is no hepatosplenomegaly. There is tenderness in the right upper quadrant and epigastric area. There is guarding and CVA tenderness (right side only). There is no rigidity and no rebound.  Lymphadenopathy:    She has no cervical adenopathy.  Neurological: She is alert and oriented to person, place, and time.  Skin: Skin is warm and dry.  Psychiatric: She has a normal mood and affect. Her behavior is normal. Judgment and thought content normal.    Urgent Care Course     Procedures (including critical care time)  Labs Review Labs Reviewed  URINALYSIS, COMPLETE (UACMP) WITH MICROSCOPIC - Abnormal; Notable for the following:       Result Value   Specific Gravity, Urine <1.005 (*)    Hgb urine dipstick SMALL (*)    Squamous Epithelial / LPF 0-5 (*)    All other components within normal limits    Imaging Review US Abdomen Limited Ruq  Result Date:  11/24/2016 CLINICAL DATA:  Right upper quadrant pain for 2 weeks EXAM: US ABDOMEN LIMITED - RIGHT UPPER QUADRANT COMPARISON:  None. FINDINGS: Gallbladder: Gallbladder sludge. No gallstones or wall thickening visualized. No sonographic Murphy sign noted by sonographer. Common bile duct: Diameter: 2 mm Liver: No focal lesion identified. Within normal limits in parenchymal echogenicity. IMPRESSION: No cholelithiasis or sonographic evidence of acute cholecystitis. Electronically Signed   By: Kathreen Devoid   On: 11/24/2016 13:41     Visual Acuity Review  Right Eye Distance:   Left Eye Distance:   Bilateral Distance:    Right Eye Near:   Left Eye Near:    Bilateral Near:         MDM   1. Right upper quadrant abdominal pain   2. Right flank pain   3. Nausea    Reviewed urinalysis results with patient. Only  small amount of blood present- no signs of infection. Discussed that she will need further work-up for her abdominal pain. Reviewed case with Dr. Alveta Heimlich. Recommend limited RUQ abdominal ultrasound today as out-patient at [facility] to rule out gallbladder disease. Sent prescription for Carafate 4 times a day as directed if needed to local pharmacy. Follow-up pending ultrasound results.     Katy Apo, NP 11/24/16 1206  11/24/16 1330: [facility] called with U/S report- some sludge present in gallbladder but no stones or blockage. Reviewed results of U/S with patient on phone. Recommend start Carafate as directed. Continue to monitor symptoms. Recommend schedule follow-up appointment with PCP next week for recheck or go to ER if pain/symptoms worsen.     Katy Apo, NP 11/24/16 [phone]

## 2016-11-24 NOTE — Progress Notes (Signed)
BP 124/78   Pulse 96   Temp 99 F (37.2 C) (Oral)   Resp 14   Wt 156 lb 2 oz (70.8 kg)   LMP 11/24/2016   SpO2 94%   BMI 23.06 kg/m    Subjective:    Patient ID: Ana Knapp, female    DOB: 01/09/1981, 36 y.o.   MRN: [id number]  HPI: Ana Knapp is a 36 y.o. female  Chief Complaint  Patient presents with  . Hospitalization Follow-up    gallbladder    She was at the urgent care earlier today; note reviewed; she had an US done and she was instructed to f/u pending [place] results She had an episode of eating and then felt badly about two weeks ago She went to [facility], had CXR, D-dimer (less than 150), urine, urine pregnancy, CBC (normal WBC), basic labs (no LFTs), EKG; they sent her home Then this happened again Thought maybe stomach bug Reviewed urgent care note; flank pain pretty constant today Ate a banana and yogurt and felt badly, felt nauseated; felt sick this morning even before she ate No jaundice No fevers; almost 100 degrees on Tuesday She was dry heaving on Tuesday No travel No light tan stools; no blood in stools  US ABDOMEN LIMITED - RIGHT UPPER QUADRANT  COMPARISON:  None.  FINDINGS: Gallbladder:  Gallbladder sludge. No gallstones or wall thickening visualized. No sonographic Murphy sign noted by sonographer.  Common bile duct:  Diameter: 2 mm  Liver:  No focal lesion identified. Within normal limits in parenchymal echogenicity.  IMPRESSION: No cholelithiasis or sonographic evidence of acute cholecystitis.   Electronically Signed   By: Kathreen Devoid   On: 11/24/2016 13:41  Relevant past medical, surgical, family and social history reviewed Past Medical History:  Diagnosis Date  . Chronic sinusitis   . Fibromyalgia   . May-Thurner syndrome 11/30/2015   January 2017; 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[facility]   . Pulmonary embolism [facility])    Past Surgical History:  Procedure Laterality Date  . ANGIOPLASTY / STENTING FEMORAL  March 2017    . Lytics Catheter Placement  11/16/15   Family History  Problem Relation Age of Onset  . Thyroid disease Mother   . Diabetes Mother   . Cancer Father     bladder  . Hypertension Maternal Grandmother   . Thyroid disease Paternal Grandmother   . Diabetes Paternal Grandmother   . Stroke Paternal Grandmother   . Cancer Paternal Grandfather     skin  . Heart disease Neg Hx   . COPD Neg Hx    Social History  Substance Use Topics  . Smoking status: Never Smoker  . Smokeless tobacco: Never Used  . Alcohol use No     Comment: rare   Interim medical history since last visit reviewed. Allergies and medications reviewed  Review of Systems Per HPI unless specifically indicated above     Objective:    BP 124/78   Pulse 96   Temp 99 F (37.2 C) (Oral)   Resp 14   Wt 156 lb 2 oz (70.8 kg)   LMP 11/24/2016   SpO2 94%   BMI 23.06 kg/m   Wt Readings from Last 3 Encounters:  11/24/16 156 lb 2 oz (70.8 kg)  11/24/16 154 lb (69.9 kg)  08/18/16 156 lb (70.8 kg)    Physical Exam  Constitutional: She appears well-developed and well-nourished. She appears distressed (appears to not feel well, nontoxic, but in discomfort).  HENT:  Head: Normocephalic and atraumatic.  Cardiovascular: Normal rate and regular rhythm.   Pulmonary/Chest: Effort normal and breath sounds normal.  Abdominal: Bowel sounds are normal. She exhibits no distension. There is tenderness. There is guarding (right upper quadrant, right flank).  Skin: She is not diaphoretic. No pallor.   Results for orders placed or performed during the hospital encounter of 11/24/16  Urinalysis, Complete w Microscopic  Result Value Ref Range   Color, Urine YELLOW YELLOW   APPearance CLEAR CLEAR   Specific Gravity, Urine <1.005 (L) 1.005 - 1.030   pH 5.5 5.0 - 8.0   Glucose, UA NEGATIVE NEGATIVE mg/dL   Hgb urine dipstick SMALL (A) NEGATIVE   Bilirubin Urine NEGATIVE NEGATIVE   Ketones, ur NEGATIVE NEGATIVE mg/dL   Protein,  ur NEGATIVE NEGATIVE mg/dL   Nitrite NEGATIVE NEGATIVE   Leukocytes, UA NEGATIVE NEGATIVE   Squamous Epithelial / LPF 0-5 (A) NONE SEEN   WBC, UA 0-5 0 - 5 WBC/hpf   RBC / HPF 0-5 0 - 5 RBC/hpf   Bacteria, UA NONE SEEN NONE SEEN      Assessment & Plan:   Problem List Items Addressed This Visit    None    Visit Diagnoses    Acute right flank pain    -  Primary   reviewed urine, labs, [place] report; discussed ddx with patient; she needs ER work-up now with additional labs and imaging; she agrees to go now to ER   Abdominal pain, RUQ (right upper quadrant)       already had [place]; ddx includes cholecystitis, duodenitis, colitis, hepatitis, ulcer; ptneeds labs and imaging and work-up in ER setting now; patient will go to ER       Follow up plan: No Follow-up on file.  An after-visit summary was printed and given to the patient at [place].  Please see the patient instructions which may contain other information and recommendations beyond what is mentioned above in the assessment and plan.  Meds ordered this encounter  Medications  . Multiple Vitamin (MULTI-VITAMINS) TABS    Sig: Take by mouth.    No orders of the defined types were placed in this encounter.

## 2016-11-24 NOTE — Discharge Instructions (Signed)
You will be scheduled for abdominal ultrasound for further evaluation of your right upper abdominal pain. We will follow-up pending ultrasound results. You were also prescribed Carafate to help with abdominal discomfort. You may take this 4 times a day as needed.

## 2016-11-24 NOTE — ED Triage Notes (Signed)
Suprapubic abd pain, and right flank pain with nausea and possible fever x several days.

## 2016-11-26 DIAGNOSIS — R1011 Right upper quadrant pain: Secondary | ICD-10-CM | POA: Insufficient documentation

## 2016-11-26 NOTE — Addendum Note (Signed)
Addended by: LADA, Satira Anis on: 11/26/2016 07:27 AM   Modules accepted: Orders

## 2016-11-26 NOTE — Telephone Encounter (Signed)
HIDA scan ordered 

## 2016-11-26 NOTE — Assessment & Plan Note (Signed)
Order HIDA scan

## 2016-11-28 ENCOUNTER — Encounter: Payer: Self-pay | Admitting: Family Medicine

## 2016-11-28 DIAGNOSIS — R1011 Right upper quadrant pain: Secondary | ICD-10-CM

## 2016-11-28 NOTE — Assessment & Plan Note (Signed)
RUQ pain, right flank pain; refer to gen surgeon; HIDA scan already ordered

## 2016-11-28 NOTE — Addendum Note (Signed)
Addended by: Jerric Oyen, Satira Anis on: 11/28/2016 04:28 PM   Modules accepted: Orders

## 2016-11-29 ENCOUNTER — Encounter: Payer: Self-pay | Admitting: Family Medicine

## 2016-11-29 NOTE — Telephone Encounter (Signed)
Ana Knapp, can you check into her HIDA scan and get back to the patient? Thank you

## 2016-12-05 ENCOUNTER — Telehealth: Payer: Self-pay | Admitting: Family Medicine

## 2016-12-05 ENCOUNTER — Encounter
Admission: RE | Admit: 2016-12-05 | Discharge: 2016-12-05 | Disposition: A | Discharge status: Home / Self Care | Payer: BLUE CROSS/BLUE SHIELD | Source: Ambulatory Visit | Attending: Family Medicine | Admitting: Family Medicine

## 2016-12-05 DIAGNOSIS — Z6824 Body mass index (BMI) 24.0-24.9, adult: Secondary | ICD-10-CM | POA: Diagnosis not present

## 2016-12-05 DIAGNOSIS — K828 Other specified diseases of gallbladder: Secondary | ICD-10-CM | POA: Diagnosis not present

## 2016-12-05 DIAGNOSIS — R1011 Right upper quadrant pain: Secondary | ICD-10-CM | POA: Diagnosis not present

## 2016-12-05 DIAGNOSIS — R932 Abnormal findings on diagnostic imaging of liver and biliary tract: Secondary | ICD-10-CM | POA: Diagnosis not present

## 2016-12-05 MED ORDER — TECHNETIUM TC 99M MEBROFENIN IV KIT
5.0000 | PACK | Freq: Once | INTRAVENOUS | Status: AC | PRN
  Administered 2016-12-05: 5.08 via INTRAVENOUS

## 2016-12-05 NOTE — Telephone Encounter (Signed)
Patient has an appt today @ 3:15pm with Dr. Demetrius Revel [facility] general surgeon).

## 2016-12-05 NOTE — Telephone Encounter (Signed)
Thank you so much

## 2016-12-05 NOTE — Telephone Encounter (Signed)
Please let surgeon's office know that patient's HIDA scan is abnormal; I think they can access copy, but fax if not Thank you

## 2016-12-06 ENCOUNTER — Encounter: Payer: Self-pay | Admitting: Family Medicine

## 2016-12-08 ENCOUNTER — Ambulatory Visit: Payer: BLUE CROSS/BLUE SHIELD

## 2016-12-12 DIAGNOSIS — R1013 Epigastric pain: Secondary | ICD-10-CM | POA: Diagnosis not present

## 2016-12-12 DIAGNOSIS — R1011 Right upper quadrant pain: Secondary | ICD-10-CM | POA: Diagnosis not present

## 2016-12-12 DIAGNOSIS — R932 Abnormal findings on diagnostic imaging of liver and biliary tract: Secondary | ICD-10-CM | POA: Diagnosis not present

## 2016-12-12 DIAGNOSIS — K828 Other specified diseases of gallbladder: Secondary | ICD-10-CM | POA: Diagnosis not present

## 2016-12-14 ENCOUNTER — Telehealth: Payer: Self-pay

## 2016-12-14 NOTE — Telephone Encounter (Signed)
Per the request of Dr. Enid Derry, I contacted this patient since [facility]) was unable to get in contact with her.  Patient stated that she is suppose to have gall bladder surgery so she postponed the ob/gyn referral for now. She stated she will f/u with them after the other issue is taking care of.   I told her to give [place] a call if she needed [place] and she said ok.

## 2016-12-19 DIAGNOSIS — K828 Other specified diseases of gallbladder: Secondary | ICD-10-CM | POA: Diagnosis not present

## 2016-12-19 DIAGNOSIS — I82402 Acute embolism and thrombosis of unspecified deep veins of left lower extremity: Secondary | ICD-10-CM | POA: Diagnosis not present

## 2016-12-19 DIAGNOSIS — R1011 Right upper quadrant pain: Secondary | ICD-10-CM | POA: Diagnosis not present

## 2016-12-20 ENCOUNTER — Encounter: Payer: Self-pay | Admitting: Family Medicine

## 2016-12-21 ENCOUNTER — Other Ambulatory Visit: Payer: Self-pay | Admitting: Family Medicine

## 2016-12-21 MED ORDER — MONTELUKAST SODIUM 10 MG PO TABS: 10.0000 mg | ORAL_TABLET | Freq: Every day | ORAL | 11 refills | Status: DC

## 2016-12-21 MED ORDER — PANTOPRAZOLE SODIUM 40 MG PO TBEC: 40.0000 mg | DELAYED_RELEASE_TABLET | Freq: Every day | ORAL | 2 refills | Status: DC

## 2016-12-21 NOTE — Addendum Note (Signed)
Addended by: Kelliann Pendergraph, Satira Anis on: 12/21/2016 01:23 PM   Modules accepted: Orders

## 2016-12-21 NOTE — Addendum Note (Signed)
Addended by: Usman Millett, Satira Anis on: 12/21/2016 08:20 AM   Modules accepted: Orders

## 2016-12-28 ENCOUNTER — Encounter: Payer: Self-pay | Admitting: Family Medicine

## 2016-12-29 DIAGNOSIS — R160 Hepatomegaly, not elsewhere classified: Secondary | ICD-10-CM | POA: Diagnosis not present

## 2016-12-29 DIAGNOSIS — Z79899 Other long term (current) drug therapy: Secondary | ICD-10-CM | POA: Diagnosis not present

## 2016-12-29 DIAGNOSIS — K8020 Calculus of gallbladder without cholecystitis without obstruction: Secondary | ICD-10-CM | POA: Diagnosis not present

## 2016-12-29 DIAGNOSIS — Z7982 Long term (current) use of aspirin: Secondary | ICD-10-CM | POA: Diagnosis not present

## 2016-12-29 DIAGNOSIS — Z86711 Personal history of pulmonary embolism: Secondary | ICD-10-CM | POA: Diagnosis not present

## 2016-12-29 DIAGNOSIS — K811 Chronic cholecystitis: Secondary | ICD-10-CM | POA: Diagnosis not present

## 2016-12-29 DIAGNOSIS — K219 Gastro-esophageal reflux disease without esophagitis: Secondary | ICD-10-CM | POA: Diagnosis not present

## 2016-12-29 DIAGNOSIS — J45909 Unspecified asthma, uncomplicated: Secondary | ICD-10-CM | POA: Diagnosis not present

## 2016-12-29 DIAGNOSIS — K8080 Other cholelithiasis without obstruction: Secondary | ICD-10-CM | POA: Diagnosis not present

## 2016-12-29 DIAGNOSIS — Z86718 Personal history of other venous thrombosis and embolism: Secondary | ICD-10-CM | POA: Diagnosis not present

## 2016-12-29 HISTORY — PX: ROBOTIC ASSISTED LAPAROSCOPIC CHOLECYSTECTOMY-SINGLE SITE: SHX6604

## 2016-12-30 DIAGNOSIS — K219 Gastro-esophageal reflux disease without esophagitis: Secondary | ICD-10-CM | POA: Diagnosis not present

## 2016-12-30 DIAGNOSIS — Z79899 Other long term (current) drug therapy: Secondary | ICD-10-CM | POA: Diagnosis not present

## 2016-12-30 DIAGNOSIS — J45909 Unspecified asthma, uncomplicated: Secondary | ICD-10-CM | POA: Diagnosis not present

## 2016-12-30 DIAGNOSIS — K811 Chronic cholecystitis: Secondary | ICD-10-CM | POA: Diagnosis not present

## 2016-12-30 DIAGNOSIS — Z86711 Personal history of pulmonary embolism: Secondary | ICD-10-CM | POA: Diagnosis not present

## 2016-12-30 DIAGNOSIS — R160 Hepatomegaly, not elsewhere classified: Secondary | ICD-10-CM | POA: Diagnosis not present

## 2016-12-30 DIAGNOSIS — Z7982 Long term (current) use of aspirin: Secondary | ICD-10-CM | POA: Diagnosis not present

## 2016-12-30 DIAGNOSIS — Z86718 Personal history of other venous thrombosis and embolism: Secondary | ICD-10-CM | POA: Diagnosis not present

## 2016-12-31 ENCOUNTER — Encounter: Payer: Self-pay | Admitting: Family Medicine

## 2017-01-12 ENCOUNTER — Ambulatory Visit
Admission: EM | Admit: 2017-01-12 | Discharge: 2017-01-12 | Disposition: A | Discharge status: Home / Self Care | Payer: BLUE CROSS/BLUE SHIELD | Attending: Family Medicine | Admitting: Family Medicine

## 2017-01-12 ENCOUNTER — Encounter: Payer: Self-pay | Admitting: Emergency Medicine

## 2017-01-12 DIAGNOSIS — J0110 Acute frontal sinusitis, unspecified: Secondary | ICD-10-CM

## 2017-01-12 DIAGNOSIS — H9201 Otalgia, right ear: Secondary | ICD-10-CM

## 2017-01-12 DIAGNOSIS — J0100 Acute maxillary sinusitis, unspecified: Secondary | ICD-10-CM

## 2017-01-12 MED ORDER — CEFDINIR 300 MG PO CAPS: 300.0000 mg | ORAL_CAPSULE | Freq: Two times a day (BID) | ORAL | 0 refills | Status: AC

## 2017-01-12 NOTE — ED Provider Notes (Signed)
[facility] ____________________________________________  Time seen: Approximately 7:45 PM  I have reviewed the triage vital signs and the nursing notes.   HISTORY  Chief Complaint Nasal Congestion   HPI Ana Knapp is a 36 y.o. female patient presenting for mostly 6-7 days of sinus congestion, sinus pressure and nasal drainage. Patient reports her to the last week she has been having intermittent allergy issues consistent nasal congestion and her typical allergies, but reports symptoms worsened over the last week and now with sinus pressure. Reports occasional cough. Reports today with some right ear discomfort described as mild and aching. Denies trauma. Denies known fevers. Reports has continued to eat and drink well. States occasional throat irritation, denies sore throat at this time. Patient reports she is to follow with ENT and plans to follow back up due to her allergy issues. Reports has been taking her daily Claritin and Flonase.  Denies chest pain, shortness of breath, and chest pain with deep breath, dysuria, extremity pain, extremity swelling or rash. Denies recent sickness. Denies recent antibiotic use. Reports she does have a history of PE and DVT after being on oral contraceptives, denies any recurrence. States now only on aspirin daily. Patient also reports she is approximately one month post laparoscopic cholecystectomy and reports surgical sites are healing well and abdominal pain gradually continues to improve. Denies any abdominal complaints this time.  LMP: Started today. Declines chance of pregnancy. Enid Derry, MD: PCP   Past Medical History:  Diagnosis Date  . Chronic sinusitis   . Fibromyalgia   . May-Thurner syndrome 11/30/2015   January 2017; 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[facility]   . Pulmonary embolism [facility])     Patient Active Problem List   Diagnosis Date Noted  . RUQ pain 11/26/2016  . Constipation 08/18/2016  . Pre-conception counseling  08/18/2016  . Vitamin B12 deficiency 08/18/2016  . Vitamin D deficiency 08/18/2016  . Shortness of breath 06/19/2016  . Abdominal pain 06/15/2016  . Microcytic hypochromic anemia 06/15/2016  . Abnormal weight gain 06/15/2016  . Fatigue 06/15/2016  . Hyperphosphatemia 11/30/2015  . May-Thurner syndrome 11/30/2015  . Asthma exacerbation attacks 11/04/2015  . Sinus problem 10/13/2015  . Abdominal pain, chronic, epigastric 09/28/2015  . GERD (gastroesophageal reflux disease) 09/28/2015  . Flushing 09/24/2015  . Preventative health care 08/30/2015  . Fibromyalgia 08/30/2015  . Seasonal allergic rhinitis 08/30/2015  . Hair loss 08/30/2015    Past Surgical History:  Procedure Laterality Date  . ANGIOPLASTY / STENTING FEMORAL  March 2017  . Lytics Catheter Placement  11/16/15  . ROBOTIC ASSISTED LAPAROSCOPIC CHOLECYSTECTOMY-SINGLE SITE  12/29/2016     No current facility-administered medications for this encounter.   Current Outpatient Prescriptions:  .  acetaminophen (TYLENOL) 325 MG tablet, Take 650 mg by mouth every 6 (six) hours as needed., Disp: , Rfl:  .  calcium carbonate (TUMS - DOSED IN MG ELEMENTAL CALCIUM) 500 MG chewable tablet, Chew 1 tablet by mouth as needed for indigestion or heartburn., Disp: , Rfl:  .  cefdinir (OMNICEF) 300 MG capsule, Take 1 capsule (300 mg total) by mouth 2 (two) times daily., Disp: 20 capsule, Rfl: 0 .  GAVILYTE-G 236 g solution, TAKE 4000 ML BY MOUTH ONCE. DRINK 1 CUP EVERY 30 MIN UNTIL YOU BE...  (REFER TO PRESCRIPTION NOTES)., Disp: , Rfl: 0 .  loratadine (CLARITIN) 10 MG tablet, Take 10 mg by mouth daily., Disp: , Rfl:  .  montelukast (SINGULAIR) 10 MG tablet, Take 1 tablet (10 mg total) by  mouth at bedtime., Disp: 30 tablet, Rfl: 11 .  Multiple Vitamin (MULTI-VITAMINS) TABS, Take by mouth., Disp: , Rfl:  .  ondansetron (ZOFRAN ODT) 4 MG disintegrating tablet, Take 1 tablet (4 mg total) by mouth every 8 (eight) hours as needed for nausea or  vomiting., Disp: 20 tablet, Rfl: 0 .  pantoprazole (PROTONIX) 40 MG tablet, Take 1 tablet (40 mg total) by mouth daily., Disp: 30 tablet, Rfl: 2 .  polyethylene glycol (MIRALAX / GLYCOLAX) packet, Take 17 g by mouth daily as needed., Disp: , Rfl:  .  sucralfate (CARAFATE) 1 GM/10ML suspension, Take 10 mLs (1 g total) by mouth 4 (four) times daily -  with meals and at bedtime., Disp: 420 mL, Rfl: 0  Allergies Ortho tri-cyclen [norgestimate-eth estradiol]; Prednisone; and Augmentin [amoxicillin-pot clavulanate]  Family History  Problem Relation Age of Onset  . Thyroid disease Mother   . Diabetes Mother   . Cancer Father     bladder  . Hypertension Maternal Grandmother   . Thyroid disease Paternal Grandmother   . Diabetes Paternal Grandmother   . Stroke Paternal Grandmother   . Cancer Paternal Grandfather     skin  . Heart disease Neg Hx   . COPD Neg Hx     Social History Social History  Substance Use Topics  . Smoking status: Never Smoker  . Smokeless tobacco: Never Used  . Alcohol use No     Comment: rare    Review of Systems Constitutional: No fever/chills Eyes: No visual changes. ENT: As above. Cardiovascular: Denies chest pain. Respiratory: Denies shortness of breath. Gastrointestinal: No abdominal pain.  No nausea, no vomiting.  No diarrhea.  No constipation. Genitourinary: Negative for dysuria. Musculoskeletal: Negative for back pain. Skin: Negative for rash. Neurological: Negative for headaches, focal weakness or numbness.  10-point ROS otherwise negative.  ____________________________________________   PHYSICAL EXAM:  VITAL SIGNS: ED Triage Vitals  Enc Vitals Group     BP 01/12/17 1802 (!) 114/59     Pulse Rate 01/12/17 1802 81     Resp 01/12/17 1802 16     Temp 01/12/17 1802 98.7 F (37.1 C)     Temp Source 01/12/17 1802 Oral     SpO2 01/12/17 1802 100 %     Weight 01/12/17 1802 150 lb (68 kg)     Height 01/12/17 1802 5\' 9"  (1.753 m)     Head  Circumference --      Peak Flow --      Pain Score 01/12/17 1803 4     Pain Loc --      Pain Edu? --      Excl. in [place]? --     Constitutional: Alert and oriented. Well appearing and in no acute distress. Eyes: Conjunctivae are normal. PERRL. EOMI. Head: Atraumatic.Mild tenderness to palpation bilateral frontal and maxillary sinuses. No swelling. No erythema.   Ears: Left: Nontender, no erythema, normal TM. Right: Nontender, mildly erythematous, dull TM. No surrounding tenderness, swelling or erythema bilaterally.  Nose: nasal congestion with bilateral nasal turbinate erythema and edema.   Mouth/Throat: Mucous membranes are moist.  Oropharynx non-erythematous.No tonsillar swelling or exudate.  Neck: No stridor.  No cervical spine tenderness to palpation. Hematological/Lymphatic/Immunilogical: No cervical lymphadenopathy. Cardiovascular: Normal rate, regular rhythm. Grossly normal heart sounds.  Good peripheral circulation. Respiratory: Normal respiratory effort.  No retractions. No wheezes, rales or rhonchi. Good air movement. No cough noted in room.  Gastrointestinal: Soft and mild tenderness to right upper quadrant and per patient  consistent with her recent surgery and states is improving daily. Abdomen otherwise soft and nontender. CVA tenderness. Superior umbilicus surgical site well approximated without erythema or fluctuance or drainage.  Musculoskeletal: No lower extremity tenderness nor edema.  Bilateral pedal pulses equal and easily palpated. No cervical, thoracic or lumbar tenderness to palpation.  Neurologic:  Normal speech and language.No gait instability. Skin:  Skin is warm, dry and intact. No rash noted. Psychiatric: Mood and affect are normal. Speech and behavior are normal.  ___________________________________________   LABS (all labs ordered are listed, but only abnormal results are displayed)  Labs Reviewed - No data to display  PROCEDURES Procedures    INITIAL  IMPRESSION / ASSESSMENT AND PLAN / ED COURSE  Pertinent labs & imaging results that were available during my care of the patient were reviewed by me and considered in my medical decision making (see chart for details).  Well appearing patient. No acute distress. Suspect allergic rhinitis versus upper respiratory infection, and discussed with patient concern for secondary sinusitis. Encouraged supportive care, nasal saline rinses, avoidance of triggers. Patient reports Augmentin allergic. As with mild erythema to right ear, will treat patient with oral cefdinir. Patient reports as tolerated cefdinir well in the past. Discussed indication, risks and benefits of medications with patient.  Discussed follow up with Primary care physician this week. Discussed follow up and return parameters including no resolution or any worsening concerns. Patient verbalized understanding and agreed to plan.   ____________________________________________   FINAL CLINICAL IMPRESSION(S) / ED DIAGNOSES  Final diagnoses:  Acute frontal sinusitis, recurrence not specified  Acute maxillary sinusitis, recurrence not specified  Acute otalgia, right     Discharge Medication List as of 01/12/2017  7:07 PM    START taking these medications   Details  cefdinir (OMNICEF) 300 MG capsule Take 1 capsule (300 mg total) by mouth 2 (two) times daily., Starting Thu 01/12/2017, Until Sun 01/22/2017, Normal        Note: This dictation was prepared with Dragon dictation along with smaller phrase technology. Any transcriptional errors that result from this process are unintentional.         Marylene Land, NP 01/12/17 1954

## 2017-01-12 NOTE — ED Triage Notes (Signed)
Pt reports nasal congestion and drainage and mild cough that started over last 2 days

## 2017-01-12 NOTE — Discharge Instructions (Signed)
Take medication as prescribed. Rest. Drink plenty of fluids.  ° °Follow up with your primary care physician this week as needed. Return to Urgent care for new or worsening concerns.  ° °

## 2017-02-19 ENCOUNTER — Emergency Department: Payer: BLUE CROSS/BLUE SHIELD

## 2017-02-19 DIAGNOSIS — K5900 Constipation, unspecified: Secondary | ICD-10-CM | POA: Insufficient documentation

## 2017-02-19 DIAGNOSIS — R1032 Left lower quadrant pain: Secondary | ICD-10-CM | POA: Diagnosis not present

## 2017-02-19 NOTE — ED Notes (Signed)
Pt ambulatory with steady gait to radiology department for xray; declined wheelchair, says hurts too much to sit down

## 2017-02-19 NOTE — ED Triage Notes (Signed)
Patient reports history of constipation.  Reports last good BM was 2 days ago.  Reports changed from stool softner to benefiber, but not working as well.  Reports did have a BM in lobby bathroom prior to triage.

## 2017-02-20 ENCOUNTER — Emergency Department
Admission: EM | Admit: 2017-02-20 | Discharge: 2017-02-20 | Disposition: A | Discharge status: Home / Self Care | Payer: BLUE CROSS/BLUE SHIELD | Attending: Emergency Medicine | Admitting: Emergency Medicine

## 2017-02-20 DIAGNOSIS — K5900 Constipation, unspecified: Secondary | ICD-10-CM

## 2017-02-20 DIAGNOSIS — R1032 Left lower quadrant pain: Secondary | ICD-10-CM

## 2017-02-20 MED ORDER — POLYETHYLENE GLYCOL 3350 17 G PO PACK: 17.0000 g | PACK | Freq: Every day | ORAL | 0 refills | Status: DC

## 2017-02-20 MED ORDER — PHENYLEPHRINE-WITCH HAZEL 0.25-50 % RE GEL: 1.0000 "application " | Freq: Two times a day (BID) | RECTAL | 0 refills | Status: DC

## 2017-02-20 NOTE — ED Notes (Signed)
Pt. States constipated for the past two days. Pt. States using OTC medication with no relief.  Pt. States while in waiting room pt. Used the bathroom and had bowel movement.

## 2017-02-20 NOTE — ED Notes (Signed)
Pt. Going home with husband. 

## 2017-02-20 NOTE — ED Provider Notes (Signed)
[facility] Emergency Department Provider Note   ____________________________________________   First MD Initiated Contact with Patient 02/20/17 0308     (approximate)  I have reviewed the triage vital signs and the nursing notes.   HISTORY  Chief Complaint Constipation    HPI Ana Knapp is a 36 y.o. female who comes into the hospital today with constipation. The patient reports that she had a cholecystectomy on March 8. She was told to take medicine for constipation but after taking it for some time she decided a few days ago to stop taking it. The patient was taking MiraLAX and Dulcolax. She reports that she went off of it a couple of days ago and has not had a bowel movement in the past 2 days. She reports that she has tried and has had some blood in the toilet but has been unsuccessful. The patient reports that tonight she had some severe lower abdominal and rectal pain. She is also had some intermittent right upper quadrant pain. The patient reports that she feels as though she has to have a bowel movement but she is unable to. She has also had some bouts of nausea. The patient started taking some Benefiber but it did not help. Currently a 4-5 out of 10 in intensity. She reports that she still feels as though she has to go to the bathroom. She reports that she did have a bowel movement in triage. She reports that it was painful but it did help with her abdominal pain. The patient did take 2 Ex-Lax but has not done anything. The patient is here today for evaluation.   Past Medical History:  Diagnosis Date  . Chronic sinusitis   . Fibromyalgia   . May-Thurner syndrome 11/30/2015   January 2017; 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[facility]   . Pulmonary embolism [facility])     Patient Active Problem List   Diagnosis Date Noted  . RUQ pain 11/26/2016  . Constipation 08/18/2016  . Pre-conception counseling 08/18/2016  . Vitamin B12 deficiency 08/18/2016  . Vitamin D  deficiency 08/18/2016  . Shortness of breath 06/19/2016  . Abdominal pain 06/15/2016  . Microcytic hypochromic anemia 06/15/2016  . Abnormal weight gain 06/15/2016  . Fatigue 06/15/2016  . Hyperphosphatemia 11/30/2015  . May-Thurner syndrome 11/30/2015  . Asthma exacerbation attacks 11/04/2015  . Sinus problem 10/13/2015  . Abdominal pain, chronic, epigastric 09/28/2015  . GERD (gastroesophageal reflux disease) 09/28/2015  . Flushing 09/24/2015  . Preventative health care 08/30/2015  . Fibromyalgia 08/30/2015  . Seasonal allergic rhinitis 08/30/2015  . Hair loss 08/30/2015    Past Surgical History:  Procedure Laterality Date  . ANGIOPLASTY / STENTING FEMORAL  March 2017  . Lytics Catheter Placement  11/16/15  . ROBOTIC ASSISTED LAPAROSCOPIC CHOLECYSTECTOMY-SINGLE SITE  12/29/2016    Prior to Admission medications   Medication Sig Start Date End Date Taking? Authorizing Provider  acetaminophen (TYLENOL) 325 MG tablet Take 650 mg by mouth every 6 (six) hours as needed.    Historical Provider, MD  calcium carbonate (TUMS - DOSED IN MG ELEMENTAL CALCIUM) 500 MG chewable tablet Chew 1 tablet by mouth as needed for indigestion or heartburn.    Historical Provider, MD  GAVILYTE-G 236 g solution TAKE 4000 ML BY MOUTH ONCE. DRINK 1 CUP EVERY 30 MIN UNTIL YOU BE...  (REFER TO PRESCRIPTION NOTES). 11/25/16   Historical Provider, MD  loratadine (CLARITIN) 10 MG tablet Take 10 mg by mouth daily.    Historical Provider, MD  montelukast (SINGULAIR) 10 MG tablet Take 1 tablet (10 mg total) by mouth at bedtime. 12/21/16   Arnetha Courser, MD  Multiple Vitamin (MULTI-VITAMINS) TABS Take by mouth.    Historical Provider, MD  ondansetron (ZOFRAN ODT) 4 MG disintegrating tablet Take 1 tablet (4 mg total) by mouth every 8 (eight) hours as needed for nausea or vomiting. 10/31/16   Arnetha Courser, MD  pantoprazole (PROTONIX) 40 MG tablet Take 1 tablet (40 mg total) by mouth daily. 12/21/16   Arnetha Courser, MD    Phenylephrine-Witch Hazel (PREPARATION H) 0.25-50 % GEL Place 1 application rectally 2 (two) times daily. 02/20/17   Loney Hering, MD  polyethylene glycol [facility] / Floria Raveling) packet Take 17 g by mouth daily as needed.    Historical Provider, MD  polyethylene glycol (MIRALAX / GLYCOLAX) packet Take 17 g by mouth daily. 02/20/17   Loney Hering, MD  sucralfate (CARAFATE) 1 GM/10ML suspension Take 10 mLs (1 g total) by mouth 4 (four) times daily -  with meals and at bedtime. 11/24/16   Katy Apo, NP    Allergies Ortho tri-cyclen [norgestimate-eth estradiol]; Prednisone; and Augmentin [amoxicillin-pot clavulanate]  Family History  Problem Relation Age of Onset  . Thyroid disease Mother   . Diabetes Mother   . Cancer Father     bladder  . Hypertension Maternal Grandmother   . Thyroid disease Paternal Grandmother   . Diabetes Paternal Grandmother   . Stroke Paternal Grandmother   . Cancer Paternal Grandfather     skin  . Heart disease Neg Hx   . COPD Neg Hx     Social History Social History  Substance Use Topics  . Smoking status: Never Smoker  . Smokeless tobacco: Never Used  . Alcohol use No     Comment: rare    Review of Systems  Constitutional: No fever/chills Eyes: No visual changes. ENT: No sore throat. Cardiovascular: Denies chest pain. Respiratory: Denies shortness of breath. Gastrointestinal:  abdominal pain, nausea, no vomiting, constipation. Genitourinary: Negative for dysuria. Musculoskeletal: Negative for back pain. Skin: Negative for rash. Neurological: Negative for headaches, focal weakness or numbness.  ____________________________________________   PHYSICAL EXAM:  VITAL SIGNS: ED Triage Vitals  Enc Vitals Group     BP 02/19/17 2319 114/66     Pulse Rate 02/19/17 2319 82     Resp 02/19/17 2319 20     Temp 02/19/17 2319 98.5 F (36.9 C)     Temp Source 02/19/17 2319 Oral     SpO2 02/19/17 2319 98 %     Weight 02/19/17 2320 150 lb  (68 kg)     Height 02/19/17 2320 5\' 9"  (1.753 m)     Head Circumference --      Peak Flow --      Pain Score 02/19/17 2319 10     Pain Loc --      Pain Edu? --      Excl. in [place]? --     Constitutional: Alert and oriented. Well appearing and in Mild distress. Eyes: Conjunctivae are normal. PERRL. EOMI. Head: Atraumatic. Nose: No congestion/rhinnorhea. Mouth/Throat: Mucous membranes are moist.  Oropharynx non-erythematous. Cardiovascular: Normal rate, regular rhythm. Grossly normal heart sounds.  Good peripheral circulation. Respiratory: Normal respiratory effort.  No retractions. Lungs CTAB. Gastrointestinal: Soft with some mild left lower quadrant tenderness to palpation. No distention. Positive bowel sounds Rectal: The patient does have a hemorrhoid noticed at her rectal sphincter.  Musculoskeletal: No lower extremity tenderness  nor edema.   Neurologic:  Normal speech and language.  Skin:  Skin is warm, dry and intact.  Psychiatric: Mood and affect are normal. .  ____________________________________________   LABS (all labs ordered are listed, but only abnormal results are displayed)  Labs Reviewed - No data to display ____________________________________________  EKG  none ____________________________________________  RADIOLOGY  KUB ____________________________________________   PROCEDURES  Procedure(s) performed: None  Procedures  Critical Care performed: No  ____________________________________________   INITIAL IMPRESSION / ASSESSMENT AND PLAN / ED COURSE  Pertinent labs & imaging results that were available during my care of the patient were reviewed by me and considered in my medical decision making (see chart for details).  This is a 36 year old female who comes into the hospital today with some constipation and lower abdominal pain. The patient did have a bowel movement while in triage. The patient reports that she feels improved although she does  still feel as though she has to go the bathroom. I encouraged the patient to resume taking her MiraLAX. She reports that she was told it was not good for her by a GI physician but I informed her that MiraLAX is safe to take on a regular basis. I also encouraged her to drink more water and eat more fiber. The patient should also take her Colace to help soften her stools especially given the fact that she does have a hemorrhoid. I will discharge the patient home and have her follow-up with her primary care physician. The patient has no further questions at this time.  Clinical Course as of Feb 21 340  Mon Feb 20, 2017  0323 Moderate retained large bowel stool without bowel obstruction. DG Abdomen 1 View [AW]    Clinical Course User Index [AW] Loney Hering, MD     ____________________________________________   FINAL CLINICAL IMPRESSION(S) / ED DIAGNOSES  Final diagnoses:  Constipation, unspecified constipation type  Left lower quadrant pain      NEW MEDICATIONS STARTED DURING THIS VISIT:  New Prescriptions   PHENYLEPHRINE-WITCH HAZEL (PREPARATION H) 0.25-50 % GEL    Place 1 application rectally 2 (two) times daily.   POLYETHYLENE GLYCOL (MIRALAX / GLYCOLAX) PACKET    Take 17 g by mouth daily.     Note:  This document was prepared using Dragon voice recognition software and may include unintentional dictation errors.    Loney Hering, MD 02/20/17 [phone]

## 2017-02-20 NOTE — ED Notes (Signed)
Pt resting in lobby with eyes closed, resp even and unlabored; female visitor awake in seat beside her; waiting patiently for treatment room;

## 2017-02-21 ENCOUNTER — Encounter: Payer: Self-pay | Admitting: Family Medicine

## 2017-02-21 ENCOUNTER — Ambulatory Visit (INDEPENDENT_AMBULATORY_CARE_PROVIDER_SITE_OTHER): Payer: BLUE CROSS/BLUE SHIELD | Admitting: Family Medicine

## 2017-02-21 VITALS — BP 112/68 | HR 84 | Temp 97.4°F | Resp 16 | Wt 144.5 lb

## 2017-02-21 DIAGNOSIS — D485 Neoplasm of uncertain behavior of skin: Secondary | ICD-10-CM | POA: Diagnosis not present

## 2017-02-21 DIAGNOSIS — K5900 Constipation, unspecified: Secondary | ICD-10-CM | POA: Diagnosis not present

## 2017-02-21 DIAGNOSIS — K644 Residual hemorrhoidal skin tags: Secondary | ICD-10-CM

## 2017-02-21 NOTE — Progress Notes (Signed)
BP 112/68   Pulse 84   Temp 97.4 F (36.3 C) (Oral)   Resp 16   Wt 144 lb 8 oz (65.5 kg)   LMP 02/05/2017   SpO2 97%   BMI 21.34 kg/m    Subjective:    Patient ID: Ana Knapp, female    DOB: 07-21-1981, 36 y.o.   MRN: [id number]  HPI: Ana Knapp is a 36 y.o. female  Chief Complaint  Patient presents with  . ER followup    constipation   HPI She was seen in the ER for constipation They had her on dulcolax Went to the ER the other night; using the bathroom and could not get it to go Went to ER night before last; finally went in the bathroom at the hospital; pain at the rectum; caused hemorrhoids, bled a little, bright red Has been on miralax since 2005; ER doctor said it's okay to keep taking She had some concerns about taking miralax long-term Stomach is much better after having her gallbladder taken out She has lost some weight lately; lost after surgery; changed eating habits, low fat diet, doctor put her on that Trying to do more walking, trying to get herself back and wants to go back to the gym She is drinking plenty of water Gets fiber in her cereal; sometimes oatmeal, pinto beans every now and then; broccoli, salads every day with lean cuisine meal  She has a mole that her mother wants checked out; on her back; not bleeding; patient can't see it, not sure if getting larger; she has not seen a dermatologist about this  Depression screen [facility] 2/9 02/21/2017 08/18/2016 06/15/2016  Decreased Interest 0 0 0  Down, Depressed, Hopeless 1 0 0  PHQ - 2 Score 1 0 0    Relevant past medical, surgical, family and social history reviewed Past Medical History:  Diagnosis Date  . Chronic sinusitis   . Fibromyalgia   . May-Thurner syndrome 11/30/2015   January 2017; 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[facility]   . Pulmonary embolism [facility])    Past Surgical History:  Procedure Laterality Date  . ANGIOPLASTY / STENTING FEMORAL  March 2017  . Lytics Catheter Placement  11/16/15  .  ROBOTIC ASSISTED LAPAROSCOPIC CHOLECYSTECTOMY-SINGLE SITE  12/29/2016   Family History  Problem Relation Age of Onset  . Thyroid disease Mother   . Diabetes Mother   . Cancer Father     bladder  . Hypertension Maternal Grandmother   . Thyroid disease Paternal Grandmother   . Diabetes Paternal Grandmother   . Stroke Paternal Grandmother   . Cancer Paternal Grandfather     skin  . Heart disease Neg Hx   . COPD Neg Hx    Social History  Substance Use Topics  . Smoking status: Never Smoker  . Smokeless tobacco: Never Used  . Alcohol use No     Comment: rare   Interim medical history since last visit reviewed. Allergies and medications reviewed  Review of Systems Per HPI unless specifically indicated above     Objective:    BP 112/68   Pulse 84   Temp 97.4 F (36.3 C) (Oral)   Resp 16   Wt 144 lb 8 oz (65.5 kg)   LMP 02/05/2017   SpO2 97%   BMI 21.34 kg/m   Wt Readings from Last 3 Encounters:  02/21/17 144 lb 8 oz (65.5 kg)  02/19/17 150 lb (68 kg)  01/12/17 150 lb (68 kg)  Physical Exam  Constitutional: She appears well-developed and well-nourished. No distress.  Cardiovascular: Normal rate.   Pulmonary/Chest: Effort normal.  Abdominal: Soft. She exhibits no distension. There is tenderness (mkild) in the right upper quadrant.  External hemorrhoid at 3 o'clock with patient in the left lateral decubitus position; no erythema, not thrombosed  Neurological: She is alert.  Skin: Lesion (10 x 6 mm irregular very dark macular nevus, almost appears bi-lobed; center upper back) noted. No pallor.     Psychiatric: She has a normal mood and affect.    Results for orders placed or performed during the hospital encounter of 11/24/16  Urinalysis, Complete w Microscopic  Result Value Ref Range   Color, Urine YELLOW YELLOW   APPearance CLEAR CLEAR   Specific Gravity, Urine <1.005 (L) 1.005 - 1.030   pH 5.5 5.0 - 8.0   Glucose, UA NEGATIVE NEGATIVE mg/dL   Hgb urine  dipstick SMALL (A) NEGATIVE   Bilirubin Urine NEGATIVE NEGATIVE   Ketones, ur NEGATIVE NEGATIVE mg/dL   Protein, ur NEGATIVE NEGATIVE mg/dL   Nitrite NEGATIVE NEGATIVE   Leukocytes, UA NEGATIVE NEGATIVE   Squamous Epithelial / LPF 0-5 (A) NONE SEEN   WBC, UA 0-5 0 - 5 WBC/hpf   RBC / HPF 0-5 0 - 5 RBC/hpf   Bacteria, UA NONE SEEN NONE SEEN      Assessment & Plan:   Problem List Items Addressed This Visit      Digestive   Constipation    Discussed adequate hydration, fiber intake of 25+ grams a day; okay to use Miralax daily       Other Visit Diagnoses    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  An after-visit summary was printed and given to the patient at [place].  Please see the patient instructions which may contain other information and recommendations beyond what is mentioned above in the assessment and plan.  Meds ordered this encounter  Medications  . aspirin EC 81 MG tablet    Sig: Take 81 mg by mouth.    Orders Placed This Encounter  Procedures  . Ambulatory referral to Dermatology

## 2017-02-21 NOTE — Assessment & Plan Note (Addendum)
Discussed adequate hydration, fiber intake of 25+ grams a day; okay to use Miralax daily

## 2017-02-21 NOTE — Patient Instructions (Addendum)
We'll get you to see the dermatologist If you have not heard anything from my staff in a week about any orders/referrals/studies from today, please contact us here to follow-up [phone] Do try to get 25 or more grams of fiber daily  High-Fiber Diet Fiber, also called dietary fiber, is a type of carbohydrate found in fruits, vegetables, whole grains, and beans. A high-fiber diet can have many health benefits. Your health care provider may recommend a high-fiber diet to help:  Prevent constipation. Fiber can make your bowel movements more regular.  Lower your cholesterol.  Relieve hemorrhoids, uncomplicated diverticulosis, or irritable bowel syndrome.  Prevent overeating as part of a weight-loss plan.  Prevent heart disease, type 2 diabetes, and certain cancers. What is my plan? The recommended daily intake of fiber includes:  38 grams for men under age 12.  87 grams for men over age 52.  31 grams for women under age 50.  77 grams for women over age 76. You can get the recommended daily intake of dietary fiber by eating a variety of fruits, vegetables, grains, and beans. Your health care provider may also recommend a fiber supplement if it is not possible to get enough fiber through your diet. What do I need to know about a high-fiber diet?  Fiber supplements have not been widely studied for their effectiveness, so it is better to get fiber through food sources.  Always check the fiber content on thenutrition facts label of any prepackaged food. Look for foods that contain at least 5 grams of fiber per serving.  Ask your dietitian if you have questions about specific foods that are related to your condition, especially if those foods are not listed in the following section.  Increase your daily fiber consumption gradually. Increasing your intake of dietary fiber too quickly may cause bloating, cramping, or gas.  Drink plenty of water. Water helps you to digest fiber. What  foods can I eat? Grains  Whole-grain breads. Multigrain cereal. Oats and oatmeal. Brown rice. Barley. Bulgur wheat. [place]. Bran muffins. Popcorn. Rye wafer crackers. Vegetables  Sweet potatoes. Spinach. Kale. Artichokes. Cabbage. Broccoli. Green peas. Carrots. Squash. Fruits  Berries. Pears. Apples. Oranges. Avocados. Prunes and raisins. Dried figs. Meats and Other Protein Sources  [place], kidney, pinto, and soy beans. Split peas. Lentils. Nuts and seeds. Dairy  Fiber-fortified yogurt. Beverages  Fiber-fortified soy milk. Fiber-fortified orange juice. Other  Fiber bars. The items listed above may not be a complete list of recommended foods or beverages. Contact your dietitian for more options.  What foods are not recommended? Grains  White bread. Pasta made with refined flour. White rice. Vegetables  Fried potatoes. Canned vegetables. Well-cooked vegetables. Fruits  Fruit juice. Cooked, strained fruit. Meats and Other Protein Sources  Fatty cuts of meat. Fried Sales executive or fried fish. Dairy  Milk. Yogurt. Cream cheese. Sour cream. Beverages  Soft drinks. Other  Cakes and pastries. Butter and oils. The items listed above may not be a complete list of foods and beverages to avoid. Contact your dietitian for more information.  What are some tips for including high-fiber foods in my diet?  Eat a wide variety of high-fiber foods.  Make sure that half of all grains consumed each day are whole grains.  Replace breads and cereals made from refined flour or white flour with whole-grain breads and cereals.  Replace white rice with brown rice, bulgur wheat, or millet.  Start the day with a breakfast that is high in fiber, such  as a cereal that contains at least 5 grams of fiber per serving.  Use beans in place of meat in soups, salads, or pasta.  Eat high-fiber snacks, such as berries, raw vegetables, nuts, or popcorn. This information is not intended to replace advice given to you  by your health care provider. Make sure you discuss any questions you have with your health care provider. Document Released: 10/10/2005 Document Revised: 03/17/2016 Document Reviewed: 03/25/2014 Elsevier Interactive Patient Education  2017 [employer] Inc[employer]  Hemorrhoids Hemorrhoids are swollen veins in and around the rectum or anus. There are two types of hemorrhoids:  Internal hemorrhoids. These occur in the veins that are just inside the rectum. They may poke through to the outside and become irritated and painful.  External hemorrhoids. These occur in the veins that are outside of the anus and can be felt as a painful swelling or hard lump near the anus. Most hemorrhoids do not cause serious problems, and they can be managed with home treatments such as diet and lifestyle changes. If home treatments do not help your symptoms, procedures can be done to shrink or remove the hemorrhoids. What are the causes? This condition is caused by increased pressure in the anal area. This pressure may result from various things, including:  Constipation.  Straining to have a bowel movement.  Diarrhea.  Pregnancy.  Obesity.  Sitting for long periods of time.  Heavy lifting or other activity that causes you to strain.  Anal sex. What are the signs or symptoms? Symptoms of this condition include:  Pain.  Anal itching or irritation.  Rectal bleeding.  Leakage of stool (feces).  Anal swelling.  One or more lumps around the anus. How is this diagnosed? This condition can often be diagnosed through a visual exam. Other exams or tests may also be done, such as:  Examination of the rectal area with a gloved hand (digital rectal exam).  Examination of the anal canal using a small tube (anoscope).  A blood test, if you have lost a significant amount of blood.  A test to look inside the colon (sigmoidoscopy or colonoscopy). How is this treated? This condition can usually be treated at  home. However, various procedures may be done if dietary changes, lifestyle changes, and other home treatments do not help your symptoms. These procedures can help make the hemorrhoids smaller or remove them completely. Some of these procedures involve surgery, and others do not. Common procedures include:  Rubber band ligation. Rubber bands are placed at the base of the hemorrhoids to cut off the blood supply to them.  Sclerotherapy. Medicine is injected into the hemorrhoids to shrink them.  Infrared coagulation. A type of light energy is used to get rid of the hemorrhoids.  Hemorrhoidectomy surgery. The hemorrhoids are surgically removed, and the veins that supply them are tied off.  Stapled hemorrhoidopexy surgery. A circular stapling device is used to remove the hemorrhoids and use staples to cut off the blood supply to them. Follow these instructions at home: Eating and drinking   Eat foods that have a lot of fiber in them, such as whole grains, beans, nuts, fruits, and vegetables. Ask your health care provider about taking products that have added fiber (fiber supplements).  Drink enough fluid to keep your urine clear or pale yellow. Managing pain and swelling   Take warm sitz baths for 20 minutes, 3-4 times a day to ease pain and discomfort.  If directed, apply ice to the affected area. Using  ice packs between sitz baths may be helpful.  Put ice in a plastic bag.  Place a towel between your skin and the bag.  Leave the ice on for 20 minutes, 2-3 times a day. General instructions   Take over-the-counter and prescription medicines only as told by your health care provider.  Use medicated creams or suppositories as told.  Exercise regularly.  Go to the bathroom when you have the urge to have a bowel movement. Do not wait.  Avoid straining to have bowel movements.  Keep the anal area dry and clean. Use wet toilet paper or moist towelettes after a bowel movement.  Do not  sit on the toilet for long periods of time. This increases blood pooling and pain. Contact a health care provider if:  You have increasing pain and swelling that are not controlled by treatment or medicine.  You have uncontrolled bleeding.  You have difficulty having a bowel movement, or you are unable to have a bowel movement.  You have pain or inflammation outside the area of the hemorrhoids. This information is not intended to replace advice given to you by your health care provider. Make sure you discuss any questions you have with your health care provider. Document Released: 10/07/2000 Document Revised: 03/09/2016 Document Reviewed: 06/24/2015 Elsevier Interactive Patient Education  2017 [employer].

## 2017-02-27 ENCOUNTER — Encounter: Payer: Self-pay | Admitting: Family Medicine

## 2017-02-27 MED ORDER — PANTOPRAZOLE SODIUM 40 MG PO TBEC: 40.0000 mg | DELAYED_RELEASE_TABLET | Freq: Every day | ORAL | 2 refills | Status: DC

## 2017-03-31 DIAGNOSIS — K219 Gastro-esophageal reflux disease without esophagitis: Secondary | ICD-10-CM | POA: Diagnosis not present

## 2017-03-31 DIAGNOSIS — R911 Solitary pulmonary nodule: Secondary | ICD-10-CM | POA: Diagnosis not present

## 2017-03-31 DIAGNOSIS — Z6822 Body mass index (BMI) 22.0-22.9, adult: Secondary | ICD-10-CM | POA: Diagnosis not present

## 2017-03-31 DIAGNOSIS — J45909 Unspecified asthma, uncomplicated: Secondary | ICD-10-CM | POA: Diagnosis not present

## 2017-03-31 DIAGNOSIS — Z7982 Long term (current) use of aspirin: Secondary | ICD-10-CM | POA: Diagnosis not present

## 2017-03-31 DIAGNOSIS — Z86711 Personal history of pulmonary embolism: Secondary | ICD-10-CM | POA: Diagnosis not present

## 2017-03-31 DIAGNOSIS — M797 Fibromyalgia: Secondary | ICD-10-CM | POA: Diagnosis not present

## 2017-03-31 DIAGNOSIS — R0602 Shortness of breath: Secondary | ICD-10-CM | POA: Diagnosis not present

## 2017-03-31 DIAGNOSIS — Z9049 Acquired absence of other specified parts of digestive tract: Secondary | ICD-10-CM | POA: Diagnosis not present

## 2017-03-31 DIAGNOSIS — R9439 Abnormal result of other cardiovascular function study: Secondary | ICD-10-CM | POA: Diagnosis not present

## 2017-03-31 DIAGNOSIS — M7989 Other specified soft tissue disorders: Secondary | ICD-10-CM | POA: Diagnosis not present

## 2017-03-31 DIAGNOSIS — Z87891 Personal history of nicotine dependence: Secondary | ICD-10-CM | POA: Diagnosis not present

## 2017-03-31 DIAGNOSIS — I871 Compression of vein: Secondary | ICD-10-CM | POA: Diagnosis not present

## 2017-03-31 DIAGNOSIS — Z95828 Presence of other vascular implants and grafts: Secondary | ICD-10-CM | POA: Diagnosis not present

## 2017-03-31 DIAGNOSIS — Z86718 Personal history of other venous thrombosis and embolism: Secondary | ICD-10-CM | POA: Diagnosis not present

## 2017-04-06 DIAGNOSIS — D225 Melanocytic nevi of trunk: Secondary | ICD-10-CM | POA: Diagnosis not present

## 2017-04-06 DIAGNOSIS — D485 Neoplasm of uncertain behavior of skin: Secondary | ICD-10-CM | POA: Diagnosis not present

## 2017-04-17 ENCOUNTER — Encounter: Payer: Self-pay | Admitting: Family Medicine

## 2017-04-17 ENCOUNTER — Other Ambulatory Visit: Payer: Self-pay | Admitting: Family Medicine

## 2017-04-17 DIAGNOSIS — D225 Melanocytic nevi of trunk: Secondary | ICD-10-CM | POA: Diagnosis not present

## 2017-04-17 DIAGNOSIS — L905 Scar conditions and fibrosis of skin: Secondary | ICD-10-CM | POA: Diagnosis not present

## 2017-04-20 ENCOUNTER — Encounter: Payer: Self-pay | Admitting: Family Medicine

## 2017-04-20 ENCOUNTER — Other Ambulatory Visit: Payer: Self-pay | Admitting: Family Medicine

## 2017-06-20 ENCOUNTER — Other Ambulatory Visit: Payer: Self-pay | Admitting: Family Medicine

## 2017-07-17 DIAGNOSIS — D2261 Melanocytic nevi of right upper limb, including shoulder: Secondary | ICD-10-CM | POA: Diagnosis not present

## 2017-07-19 ENCOUNTER — Ambulatory Visit (INDEPENDENT_AMBULATORY_CARE_PROVIDER_SITE_OTHER): Payer: BLUE CROSS/BLUE SHIELD | Admitting: Family Medicine

## 2017-07-19 ENCOUNTER — Encounter: Payer: Self-pay | Admitting: Family Medicine

## 2017-07-19 VITALS — BP 110/60 | HR 91 | Temp 98.6°F | Resp 16 | Ht 69.0 in | Wt 155.5 lb

## 2017-07-19 DIAGNOSIS — R05 Cough: Secondary | ICD-10-CM

## 2017-07-19 DIAGNOSIS — J0111 Acute recurrent frontal sinusitis: Secondary | ICD-10-CM

## 2017-07-19 DIAGNOSIS — R110 Nausea: Secondary | ICD-10-CM | POA: Diagnosis not present

## 2017-07-19 DIAGNOSIS — J349 Unspecified disorder of nose and nasal sinuses: Secondary | ICD-10-CM

## 2017-07-19 DIAGNOSIS — R059 Cough, unspecified: Secondary | ICD-10-CM

## 2017-07-19 MED ORDER — ONDANSETRON 4 MG PO TBDP: 4.0000 mg | ORAL_TABLET | Freq: Three times a day (TID) | ORAL | 0 refills | Status: DC | PRN

## 2017-07-19 MED ORDER — DOXYCYCLINE HYCLATE 100 MG PO TABS: 100.0000 mg | ORAL_TABLET | Freq: Two times a day (BID) | ORAL | 0 refills | Status: AC

## 2017-07-19 MED ORDER — BENZONATATE 100 MG PO CAPS: 100.0000 mg | ORAL_CAPSULE | Freq: Three times a day (TID) | ORAL | 0 refills | Status: DC | PRN

## 2017-07-19 NOTE — Progress Notes (Addendum)
Name: Ana Knapp   MRN: [id number]    DOB: 1980-12-05   Date:07/19/2017       Progress Note  Subjective  Chief Complaint  Chief Complaint  Patient presents with  . Sinusitis    cough, nasal drainage, congestion, runny nose for 3 days    HPI  Patient presents with complaint of 4 days of frontal sinus pain and pressure, nasal congestion, RIGHT ear pain, moderate nausea, dry cough, and fatigue.  Denies sore throat, fevers/chills, vomiting/diarrhea, or generalized body aches.  Has been taking Dayquil/Nyquil with minimal relief; also taking flonase sensamist OTC, Claritin, and singulair.   Sinus Problem: She was referred to ENT back in 2016, but was not able to go to her follow up with them because she had a blood clot in her lung and a DVT. She requests referral back to ENT for further evaluation of sinus problems.  Patient Active Problem List   Diagnosis Date Noted  . Constipation 08/18/2016  . Pre-conception counseling 08/18/2016  . Vitamin B12 deficiency 08/18/2016  . Vitamin D deficiency 08/18/2016  . Shortness of breath 06/19/2016  . Abdominal pain 06/15/2016  . Microcytic hypochromic anemia 06/15/2016  . Abnormal weight gain 06/15/2016  . Fatigue 06/15/2016  . Hyperphosphatemia 11/30/2015  . May-Thurner syndrome 11/30/2015  . Asthma exacerbation attacks 11/04/2015  . Sinus problem 10/13/2015  . Abdominal pain, chronic, epigastric 09/28/2015  . GERD (gastroesophageal reflux disease) 09/28/2015  . Flushing 09/24/2015  . Preventative health care 08/30/2015  . Fibromyalgia 08/30/2015  . Seasonal allergic rhinitis 08/30/2015  . Hair loss 08/30/2015    Social History  Substance Use Topics  . Smoking status: Never Smoker  . Smokeless tobacco: Never Used  . Alcohol use No     Comment: rare     Current Outpatient Prescriptions:  .  acetaminophen (TYLENOL) 325 MG tablet, Take 650 mg by mouth every 6 (six) hours as needed., Disp: , Rfl:  .  aspirin EC 81 MG tablet,  Take 81 mg by mouth., Disp: , Rfl:  .  loratadine (CLARITIN) 10 MG tablet, Take 10 mg by mouth daily., Disp: , Rfl:  .  montelukast (SINGULAIR) 10 MG tablet, Take 1 tablet (10 mg total) by mouth at bedtime., Disp: 30 tablet, Rfl: 11 .  Multiple Vitamin (MULTI-VITAMINS) TABS, Take by mouth., Disp: , Rfl:  .  pantoprazole (PROTONIX) 40 MG tablet, take 1 tablet by mouth once daily, Disp: 30 tablet, Rfl: 2 .  ondansetron (ZOFRAN ODT) 4 MG disintegrating tablet, Take 1 tablet (4 mg total) by mouth every 8 (eight) hours as needed for nausea or vomiting. (Patient not taking: Reported on 07/19/2017), Disp: 20 tablet, Rfl: 0 .  Phenylephrine-Witch Hazel (PREPARATION H) 0.25-50 % GEL, Place 1 application rectally 2 (two) times daily. (Patient not taking: Reported on 07/19/2017), Disp: 1 Tube, Rfl: 0 .  polyethylene glycol (MIRALAX / GLYCOLAX) packet, Take 17 g by mouth daily as needed., Disp: , Rfl:   Allergies  Allergen Reactions  . Ortho Tri-Cyclen [Norgestimate-Eth Estradiol] Other (See Comments)    Blood clot and PE  . Prednisone Anaphylaxis  . Augmentin [Amoxicillin-Pot Clavulanate] Other (See Comments)    Upsets her stomach    ROS  Ten systems reviewed and is negative except as mentioned in HPI   Objective  Vitals:   07/19/17 0913  BP: 110/60  Pulse: 91  Resp: 16  Temp: 98.6 F (37 C)  TempSrc: Oral  SpO2: 98%  Weight: 155 lb 8 oz (70.5 kg)  Height: 5\' 9"  (1.753 m)   Body mass index is 22.96 kg/m.  Nursing Note and Vital Signs reviewed.  Physical Exam  Constitutional: Patient appears well-developed and well-nourished. No distress.  HEENT: head atraumatic, normocephalic, pupils equal and reactive to light, EOM's intact, bilateral TM's bulging with mild erythema; no maxillary sinus tenderness; positive for acute frontal sinus tenderness, neck supple without lymphadenopathy, oropharynx pink and moist without exudate Cardiovascular: Normal rate, regular rhythm, S1/S2 present.  No  murmur or rub heard. No BLE edema. Pulmonary/Chest: Effort normal and breath sounds finding slight expiratory wheeze in BLL and RUE. No respiratory distress or retractions. Psychiatric: Patient has a normal mood and affect. behavior is normal. Judgment and thought content normal.  No results found for this or any previous visit (from the past 2160 hour(s)).   Assessment & Plan  1. Acute recurrent frontal sinusitis - doxycycline (VIBRA-TABS) 100 MG tablet; Take 1 tablet (100 mg total) by mouth 2 (two) times daily.  Dispense: 20 tablet; Refill: 0  2. Sinus problem - Ambulatory referral to ENT  3. Nausea - ondansetron (ZOFRAN ODT) 4 MG disintegrating tablet; Take 1 tablet (4 mg total) by mouth every 8 (eight) hours as needed for nausea or vomiting.  Dispense: 20 tablet; Refill: 0  4. Cough - benzonatate (TESSALON PERLES) 100 MG capsule; Take 1 capsule (100 mg total) by mouth 3 (three) times daily as needed.  Dispense: 20 capsule; Refill: 0   -Red flags and when to present for emergency care or RTC including fever >101.22F, eye pain or swelling, chest pain, shortness of breath, new/worsening/un-resolving symptoms,  reviewed with patient at time of visit. Follow up and care instructions discussed and provided in AVS.  I have reviewed this encounter including the documentation in this note and/or discussed this patient with the Johney Maine, FNP, NP-C. I am certifying that I agree with the content of this note as supervising physician.  Steele Sizer, MD [facility] 07/19/2017, 1:41 PM

## 2017-07-19 NOTE — Patient Instructions (Addendum)
No more than 3000mg  of Tylenol (Acetaminophen) Sinusitis, Adult Sinusitis is soreness and inflammation of your sinuses. Sinuses are hollow spaces in the bones around your face. They are located:  Around your eyes.  In the middle of your forehead.  Behind your nose.  In your cheekbones.  Your sinuses and nasal passages are lined with a stringy fluid (mucus). Mucus normally drains out of your sinuses. When your nasal tissues get inflamed or swollen, the mucus can get trapped or blocked so air cannot flow through your sinuses. This lets bacteria, viruses, and funguses grow, and that leads to infection. Follow these instructions at home: Medicines  Take, use, or apply over-the-counter and prescription medicines only as told by your doctor. These may include nasal sprays.  If you were prescribed an antibiotic medicine, take it as told by your doctor. Do not stop taking the antibiotic even if you start to feel better. Hydrate and Humidify  Drink enough water to keep your pee (urine) clear or pale yellow.  Use a cool mist humidifier to keep the humidity level in your home above 50%.  Breathe in steam for 10-15 minutes, 3-4 times a day or as told by your doctor. You can do this in the bathroom while a hot shower is running.  Try not to spend time in cool or dry air. Rest  Rest as much as possible.  Sleep with your head raised (elevated).  Make sure to get enough sleep each night. General instructions  Put a warm, moist washcloth on your face 3-4 times a day or as told by your doctor. This will help with discomfort.  Wash your hands often with soap and water. If there is no soap and water, use hand sanitizer.  Do not smoke. Avoid being around people who are smoking (secondhand smoke).  Keep all follow-up visits as told by your doctor. This is important. Contact a doctor if:  You have a fever.  Your symptoms get worse.  Your symptoms do not get better within 10 days. Get help  right away if:  You have a very bad headache.  You cannot stop throwing up (vomiting).  You have pain or swelling around your face or eyes.  You have trouble seeing.  You feel confused.  Your neck is stiff.  You have trouble breathing. This information is not intended to replace advice given to you by your health care provider. Make sure you discuss any questions you have with your health care provider. Document Released: 03/28/2008 Document Revised: 06/05/2016 Document Reviewed: 08/05/2015 Elsevier Interactive Patient Education  [employer].

## 2017-08-24 ENCOUNTER — Ambulatory Visit
Admission: EM | Admit: 2017-08-24 | Discharge: 2017-08-24 | Disposition: A | Discharge status: Home / Self Care | Payer: BLUE CROSS/BLUE SHIELD | Attending: Family Medicine | Admitting: Family Medicine

## 2017-08-24 DIAGNOSIS — H9201 Otalgia, right ear: Secondary | ICD-10-CM | POA: Diagnosis not present

## 2017-08-24 DIAGNOSIS — H6981 Other specified disorders of Eustachian tube, right ear: Secondary | ICD-10-CM

## 2017-08-24 MED ORDER — CEFUROXIME AXETIL 250 MG PO TABS: ORAL_TABLET | ORAL | 0 refills | Status: DC

## 2017-08-24 NOTE — Discharge Instructions (Signed)
Use of Flonase daily as we discussed. Follow-up with ear nose and throat at your earliest convenience

## 2017-08-24 NOTE — ED Provider Notes (Signed)
[facility]    CSN: [id number] Arrival date & time: 08/24/17  [place]     History   Chief Complaint Chief Complaint  Patient presents with  . Otalgia    right    HPI Ana Knapp is a 36 y.o. female.   HPI  Is a 36 year old female who is accompanied by her husband complaining of right ear pain that she's had for over a month. He recently had a sinus infection which has been recurrent treated with doxycycline about 3-4 weeks ago. That time she's had ear pain/ drainage. She's also been dizzy which she noticed first this morning this happened a couple times today. Will occasionally have tinnitus when the room is quiet. Her urine has been a dark yellow color. She's had no fever or chills. He has a history of May Thurner syndrome.        Past Medical History:  Diagnosis Date  . Chronic sinusitis   . Fibromyalgia   . May-Thurner syndrome 11/30/2015   January 2017; 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[facility]   . Pulmonary embolism [facility])     Patient Active Problem List   Diagnosis Date Noted  . Constipation 08/18/2016  . Pre-conception counseling 08/18/2016  . Vitamin B12 deficiency 08/18/2016  . Vitamin D deficiency 08/18/2016  . Shortness of breath 06/19/2016  . Abdominal pain 06/15/2016  . Microcytic hypochromic anemia 06/15/2016  . Abnormal weight gain 06/15/2016  . Fatigue 06/15/2016  . Hyperphosphatemia 11/30/2015  . May-Thurner syndrome 11/30/2015  . Asthma exacerbation attacks 11/04/2015  . Sinus problem 10/13/2015  . Abdominal pain, chronic, epigastric 09/28/2015  . GERD (gastroesophageal reflux disease) 09/28/2015  . Flushing 09/24/2015  . Preventative health care 08/30/2015  . Fibromyalgia 08/30/2015  . Seasonal allergic rhinitis 08/30/2015  . Hair loss 08/30/2015    Past Surgical History:  Procedure Laterality Date  . ANGIOPLASTY / STENTING FEMORAL  March 2017  . Lytics Catheter Placement  11/16/15  . ROBOTIC ASSISTED LAPAROSCOPIC CHOLECYSTECTOMY-SINGLE  SITE  12/29/2016    OB History    No data available       Home Medications    Prior to Admission medications   Medication Sig Start Date End Date Taking? Authorizing Provider  acetaminophen (TYLENOL) 325 MG tablet Take 650 mg by mouth every 6 (six) hours as needed.   Yes [provider]  loratadine (CLARITIN) 10 MG tablet Take 10 mg by mouth daily.   Yes [provider]  montelukast (SINGULAIR) 10 MG tablet Take 1 tablet (10 mg total) by mouth at bedtime. 12/21/16  Yes Lada, Satira Anis, MD  Multiple Vitamin (MULTI-VITAMINS) TABS Take by mouth.   Yes [provider]  pantoprazole (PROTONIX) 40 MG tablet take 1 tablet by mouth once daily 06/20/17  Yes Lada, Satira Anis, MD  polyethylene glycol (MIRALAX / GLYCOLAX) packet Take 17 g by mouth daily as needed.   Yes [provider]  cefUROXime (CEFTIN) 250 MG tablet Take one tablet BID with food 08/24/17   Crecencio Mc P, PA-C  ondansetron (ZOFRAN ODT) 4 MG disintegrating tablet Take 1 tablet (4 mg total) by mouth every 8 (eight) hours as needed for nausea or vomiting. 07/19/17   Hubbard Hartshorn, FNP    Family History Family History  Problem Relation Age of Onset  . Thyroid disease Mother   . Diabetes Mother   . Cancer Father        bladder  . Hypertension Maternal Grandmother   . Thyroid disease Paternal Grandmother   .  Diabetes Paternal Grandmother   . Stroke Paternal Grandmother   . Cancer Paternal Grandfather        skin  . Heart disease Neg Hx   . COPD Neg Hx     Social History Social History  Substance Use Topics  . Smoking status: Never Smoker  . Smokeless tobacco: Never Used  . Alcohol use No     Comment: rare     Allergies   Ortho tri-cyclen [norgestimate-eth estradiol]; Prednisone; and Augmentin [amoxicillin-pot clavulanate]   Review of Systems Review of Systems  Constitutional: Positive for activity change. Negative for chills, fatigue and fever.  HENT: Positive for ear  pain.   Neurological: Positive for light-headedness.  All other systems reviewed and are negative.    Physical Exam Triage Vital Signs ED Triage Vitals  Enc Vitals Group     BP 08/24/17 1845 117/71     Pulse Rate 08/24/17 1845 86     Resp 08/24/17 1845 18     Temp 08/24/17 1845 98.5 F (36.9 C)     Temp Source 08/24/17 1845 Oral     SpO2 08/24/17 1845 99 %     Weight 08/24/17 1842 150 lb (68 kg)     Height 08/24/17 1842 5\' 9"  (1.753 m)     Head Circumference --      Peak Flow --      Pain Score 08/24/17 1842 5     Pain Loc --      Pain Edu? --      Excl. in [place]? --    Orthostatic VS for the past 24 hrs:  BP- Lying Pulse- Lying BP- Sitting Pulse- Sitting BP- Standing at 0 minutes Pulse- Standing at 0 minutes  08/24/17 1921 116/70 74 113/66 72 112/69 79    Updated Vital Signs BP 117/71 (BP Location: Left Arm)   Pulse 86   Temp 98.5 F (36.9 C) (Oral)   Resp 18   Ht 5\' 9"  (1.753 m)   Wt 150 lb (68 kg)   LMP 08/02/2017   SpO2 99%   BMI 22.15 kg/m   Visual Acuity Right Eye Distance:   Left Eye Distance:   Bilateral Distance:    Right Eye Near:   Left Eye Near:    Bilateral Near:     Physical Exam  Constitutional: She is oriented to person, place, and time. She appears well-developed and well-nourished. No distress.  HENT:  Head: Normocephalic.  Left Ear: External ear normal.  Nose: Nose normal.  Mouth/Throat: Oropharynx is clear and moist. No oropharyngeal exudate.  TM is normal on the left. Right TM shows erythema in the inferior quadrant at 7-8:00 position. The TM is dark.  Eyes: Pupils are equal, round, and reactive to light. EOM are normal. Right eye exhibits no discharge. Left eye exhibits no discharge.  Neck: Normal range of motion. Neck supple.  Pulmonary/Chest: Effort normal and breath sounds normal.  Musculoskeletal: Normal range of motion.  Neurological: She is alert and oriented to person, place, and time.  Skin: Skin is warm and dry. She is not  diaphoretic.  Patient is wearing bilateral lower extremity compression hose  Psychiatric: She has a normal mood and affect. Her behavior is normal. Judgment and thought content normal.  Nursing note and vitals reviewed.    UC Treatments / Results  Labs (all labs ordered are listed, but only abnormal results are displayed) Labs Reviewed - No data to display  EKG  EKG Interpretation None  Radiology No results found.  Procedures Procedures (including critical care time)  Medications Ordered in UC Medications - No data to display   Initial Impression / Assessment and Plan / UC Course  I have reviewed the triage vital signs and the nursing notes.  Pertinent labs & imaging results that were available during my care of the patient were reviewed by me and considered in my medical decision making (see chart for details).     Plan: 1. Test/x-ray results and diagnosis reviewed with patient 2. rx as per orders; risks, benefits, potential side effects reviewed with patient 3. Recommend supportive treatment with Flonase daily. Proper use was discussed with the patient. I will also place her on Ceftin because of her chronic recurrent sinusitis in the appearance of her TM at this present time. She will follow-up with ENT at her earliest convenience. 4. F/u prn if symptoms worsen or don't improve   Final Clinical Impressions(s) / UC Diagnoses   Final diagnoses:  Eustachian tube dysfunction, right    New Prescriptions Discharge Medication List as of 08/24/2017  7:32 PM    START taking these medications   Details  cefUROXime (CEFTIN) 250 MG tablet Take one tablet BID with food, Normal         Controlled Substance Prescriptions [place] Controlled Substance Registry consulted? Not Applicable   Lorin Picket, PA-C 08/24/17 1936

## 2017-08-24 NOTE — ED Triage Notes (Signed)
Patient complains of right ear pain that has been occurring for a while. Patient states that she started having some dizziness since this morning, patient states that dizziness is worse with positional change.

## 2017-08-26 DIAGNOSIS — J9811 Atelectasis: Secondary | ICD-10-CM | POA: Diagnosis not present

## 2017-08-26 DIAGNOSIS — R918 Other nonspecific abnormal finding of lung field: Secondary | ICD-10-CM | POA: Diagnosis not present

## 2017-08-26 DIAGNOSIS — R911 Solitary pulmonary nodule: Secondary | ICD-10-CM | POA: Diagnosis not present

## 2017-08-26 DIAGNOSIS — J984 Other disorders of lung: Secondary | ICD-10-CM | POA: Diagnosis not present

## 2017-09-15 ENCOUNTER — Other Ambulatory Visit: Payer: Self-pay | Admitting: Family Medicine

## 2017-10-31 DIAGNOSIS — I871 Compression of vein: Secondary | ICD-10-CM | POA: Diagnosis not present

## 2017-10-31 DIAGNOSIS — Z9049 Acquired absence of other specified parts of digestive tract: Secondary | ICD-10-CM | POA: Diagnosis not present

## 2017-10-31 DIAGNOSIS — Z86711 Personal history of pulmonary embolism: Secondary | ICD-10-CM | POA: Diagnosis not present

## 2017-10-31 DIAGNOSIS — Z6823 Body mass index (BMI) 23.0-23.9, adult: Secondary | ICD-10-CM | POA: Diagnosis not present

## 2017-10-31 DIAGNOSIS — I82512 Chronic embolism and thrombosis of left femoral vein: Secondary | ICD-10-CM | POA: Diagnosis not present

## 2017-11-22 ENCOUNTER — Encounter: Payer: Self-pay | Admitting: Family Medicine

## 2017-11-22 ENCOUNTER — Ambulatory Visit (INDEPENDENT_AMBULATORY_CARE_PROVIDER_SITE_OTHER): Payer: BLUE CROSS/BLUE SHIELD | Admitting: Family Medicine

## 2017-11-22 VITALS — BP 130/74 | HR 93 | Temp 97.8°F | Ht 69.0 in | Wt 160.8 lb

## 2017-11-22 DIAGNOSIS — Z85820 Personal history of malignant melanoma of skin: Secondary | ICD-10-CM | POA: Diagnosis not present

## 2017-11-22 DIAGNOSIS — R5383 Other fatigue: Secondary | ICD-10-CM

## 2017-11-22 DIAGNOSIS — R635 Abnormal weight gain: Secondary | ICD-10-CM | POA: Diagnosis not present

## 2017-11-22 DIAGNOSIS — E559 Vitamin D deficiency, unspecified: Secondary | ICD-10-CM

## 2017-11-22 DIAGNOSIS — K5900 Constipation, unspecified: Secondary | ICD-10-CM | POA: Diagnosis not present

## 2017-11-22 DIAGNOSIS — E538 Deficiency of other specified B group vitamins: Secondary | ICD-10-CM

## 2017-11-22 DIAGNOSIS — Z9889 Other specified postprocedural states: Secondary | ICD-10-CM

## 2017-11-22 DIAGNOSIS — G8929 Other chronic pain: Secondary | ICD-10-CM | POA: Diagnosis not present

## 2017-11-22 DIAGNOSIS — R110 Nausea: Secondary | ICD-10-CM | POA: Insufficient documentation

## 2017-11-22 DIAGNOSIS — R1013 Epigastric pain: Secondary | ICD-10-CM

## 2017-11-22 DIAGNOSIS — R112 Nausea with vomiting, unspecified: Secondary | ICD-10-CM | POA: Insufficient documentation

## 2017-11-22 MED ORDER — PANTOPRAZOLE SODIUM 40 MG PO TBEC: 40.0000 mg | DELAYED_RELEASE_TABLET | Freq: Two times a day (BID) | ORAL | 0 refills | Status: DC

## 2017-11-22 MED ORDER — SUCRALFATE 1 G PO TABS: 1.0000 g | ORAL_TABLET | Freq: Three times a day (TID) | ORAL | 0 refills | Status: DC

## 2017-11-22 NOTE — Assessment & Plan Note (Signed)
Check TSH 

## 2017-11-22 NOTE — Patient Instructions (Signed)
Increase the protonix to twice a day Use the new sucralfate before meals and at bedtime We'll get the labs If your symptoms worsen or you see blood in your stools or they turn dark, seek medical help right away If you need something for aches or pains, try to use Tylenol (acetaminophen) instead of non-steroidals (which include Aleve, ibuprofen, Advil, Motrin, and naproxen); non-steroidals can cause long-term kidney damage and gastritis and stomach ulcers If symptoms continue, we'll have you see the gastroenterologist If you have not heard anything from my staff in a week about any orders/referrals/studies from today, please contact us here to follow-up [phone]

## 2017-11-22 NOTE — Assessment & Plan Note (Signed)
Check thyroid; continue water and exercise; take miralax daily, go up to BID

## 2017-11-22 NOTE — Progress Notes (Signed)
BP 130/74 (BP Location: Left Arm, Patient Position: Sitting, Cuff Size: Normal)   Pulse 93   Temp 97.8 F (36.6 C) (Oral)   Ht 5\' 9"  (1.753 m)   Wt 160 lb 12.8 oz (72.9 kg)   LMP 11/22/2017   SpO2 99%   BMI 23.75 kg/m    Subjective:    Patient ID: Ana Knapp, female    DOB: [birth year], 37 y.o.   MRN: [id number]  HPI: Ana Knapp is a 37 y.o. female  Chief Complaint  Patient presents with  . GI Problem    Pt states that she has been having abdomial pain after eating x 2-3 weeks, worse at night,  . Abdominal Pain    Feels like stomach is on fire   . Constipation    Pt states that even w/ Miralax BMs can be up to 3 days apart     HPI  Stomach problems for a few weeks; loss of family pet and finances She says it is worse at night; that's weird she says She went numb over the RUQ on Saturday night, that went away, just weird The burning is in the epigastrium; feels like her stomach is on fire Spicy things make it worse Had chicken and rice and scalloped potatoes last night for supper and then 1-2 hours later, just started burning Gallbladder is gone Having bloating in her stomach Periods have been kind of bad off of birth control; on her period right now, no breast tenderness, no morning nausea No change in stools No recollection of H pylori Lots of reflux, "oh yeah" she says On protonix A while ago, she recalls skipping Wed and Sunday, but started back daily Constipation; execise helps, will go after exercise; using miralax; drinks enough water Gained weight more tired; mother has thyroid disease Vit D deficiency in the past, last level 26; stopped MV when stomach issue started She also had B12 deficiency; does not eat a lot of meat Tired  Place on back turned out to be melanoma; they took out a big place on her back, [place] Derm[facility]ologist, Dallie Dad; going every 6 months  Depression screen [facility] 2/9 11/22/2017 02/21/2017 08/18/2016 06/15/2016    Decreased Interest 0 0 0 0  Down, Depressed, Hopeless 0 1 0 0  PHQ - 2 Score 0 1 0 0    Relevant past medical, surgical, family and social history reviewed Past Medical History:  Diagnosis Date  . Chronic sinusitis   . Fibromyalgia   . History of melanoma 11/22/2017  . May-Thurner syndrome 11/30/2015   January 2017; 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[facility]   . Pulmonary embolism [facility])    Past Surgical History:  Procedure Laterality Date  . ANGIOPLASTY / STENTING FEMORAL  March 2017  . Lytics Catheter Placement  11/16/15  . ROBOTIC ASSISTED LAPAROSCOPIC CHOLECYSTECTOMY-SINGLE SITE  12/29/2016  status post resection on the back of malignant melanoma  Family History  Problem Relation Age of Onset  . Thyroid disease Mother   . Diabetes Mother   . Cancer Father        bladder  . Hypertension Maternal Grandmother   . Thyroid disease Paternal Grandmother   . Diabetes Paternal Grandmother   . Stroke Paternal Grandmother   . Cancer Paternal Grandfather        skin  . Heart disease Neg Hx   . COPD Neg Hx    Social History   Tobacco Use  . Smoking status: Never Smoker  . Smokeless tobacco:  Never Used  Substance Use Topics  . Alcohol use: No    Comment: rare  . Drug use: No    Interim medical history since last visit reviewed. Allergies and medications reviewed  Review of Systems Per HPI unless specifically indicated above     Objective:    BP 130/74 (BP Location: Left Arm, Patient Position: Sitting, Cuff Size: Normal)   Pulse 93   Temp 97.8 F (36.6 C) (Oral)   Ht 5\' 9"  (1.753 m)   Wt 160 lb 12.8 oz (72.9 kg)   LMP 11/22/2017   SpO2 99%   BMI 23.75 kg/m   Wt Readings from Last 3 Encounters:  11/22/17 160 lb 12.8 oz (72.9 kg)  08/24/17 150 lb (68 kg)  07/19/17 155 lb 8 oz (70.5 kg)    Physical Exam  Constitutional: She appears well-developed and well-nourished. No distress.  HENT:  Head: Normocephalic and atraumatic.  Eyes: EOM are normal. No scleral icterus.  Neck: No  thyromegaly present.  Cardiovascular: Normal rate, regular rhythm and normal heart sounds.  No murmur heard. Pulmonary/Chest: Effort normal and breath sounds normal. No respiratory distress. She has no wheezes.  Abdominal: Soft. Bowel sounds are normal. She exhibits no distension. There is tenderness in the epigastric area. There is no rigidity and no guarding.  Musculoskeletal: Normal range of motion. She exhibits no edema.  Neurological: She is alert. She exhibits normal muscle tone.  Skin: Skin is warm and dry. She is not diaphoretic. No pallor.  Psychiatric: She has a normal mood and affect. Her behavior is normal. Judgment and thought content normal.   Results for orders placed or performed during the hospital encounter of 11/24/16  Urinalysis, Complete w Microscopic  Result Value Ref Range   Color, Urine YELLOW YELLOW   APPearance CLEAR CLEAR   Specific Gravity, Urine <1.005 (L) 1.005 - 1.030   pH 5.5 5.0 - 8.0   Glucose, UA NEGATIVE NEGATIVE mg/dL   Hgb urine dipstick SMALL (A) NEGATIVE   Bilirubin Urine NEGATIVE NEGATIVE   Ketones, ur NEGATIVE NEGATIVE mg/dL   Protein, ur NEGATIVE NEGATIVE mg/dL   Nitrite NEGATIVE NEGATIVE   Leukocytes, UA NEGATIVE NEGATIVE   Squamous Epithelial / LPF 0-5 (A) NONE SEEN   WBC, UA 0-5 0 - 5 WBC/hpf   RBC / HPF 0-5 0 - 5 RBC/hpf   Bacteria, UA NONE SEEN NONE SEEN      Assessment & Plan:   Problem List Items Addressed This Visit      Other   Vitamin D deficiency   Relevant Orders   VITAMIN D 25 Hydroxy (Vit-D Deficiency, Fractures)   Vitamin B12 deficiency   Relevant Orders   B12   History of melanoma    Followed closely by dermatologist      Fatigue    Check TSH      Relevant Orders   VITAMIN D 25 Hydroxy (Vit-D Deficiency, Fractures)   TSH   B12   Constipation    Check thyroid; continue water and exercise; take miralax daily, go up to BID      Abnormal weight gain    Check TSH      Abdominal pain, chronic, epigastric  - Primary    Check H pylori, CBC, labs; double PPI      Relevant Orders   H. pylori antigen, stool   CBC with Differential/Platelet   COMPLETE METABOLIC PANEL WITH GFR   Lipase   Helicobacter pylori special antigen    Other Visit  Diagnoses    Weight gain       Relevant Orders   TSH       Follow up plan: No Follow-up on file.  An after-visit summary was printed and given to the patient at [facility].  Please see the patient instructions which may contain other information and recommendations beyond what is mentioned above in the assessment and plan.  Meds ordered this encounter  Medications  . pantoprazole (PROTONIX) 40 MG tablet    Sig: Take 1 tablet (40 mg total) by mouth 2 (two) times daily before a meal.    Dispense:  60 tablet    Refill:  0  . sucralfate (CARAFATE) 1 g tablet    Sig: Take 1 tablet (1 g total) by mouth 4 (four) times daily -  with meals and at bedtime.    Dispense:  56 tablet    Refill:  0    Orders Placed This Encounter  Procedures  . Helicobacter pylori special antigen  . H. pylori antigen, stool  . CBC with Differential/Platelet  . COMPLETE METABOLIC PANEL WITH GFR  . Lipase  . VITAMIN D 25 Hydroxy (Vit-D Deficiency, Fractures)  . TSH  . B12

## 2017-11-22 NOTE — Assessment & Plan Note (Signed)
Followed closely by dermatologist

## 2017-11-22 NOTE — Assessment & Plan Note (Signed)
Check H pylori, CBC, labs; double PPI

## 2017-11-23 LAB — COMPLETE METABOLIC PANEL WITH GFR
AG RATIO: 1.6 (calc) (ref 1.0–2.5)
ALT: 12 U/L (ref 6–29)
AST: 14 U/L (ref 10–30)
Albumin: 4.4 g/dL (ref 3.6–5.1)
Alkaline phosphatase (APISO): 27 U/L — ABNORMAL LOW (ref 33–115)
BUN: 13 mg/dL (ref 7–25)
CALCIUM: 9.7 mg/dL (ref 8.6–10.2)
CO2: 26 mmol/L (ref 20–32)
Chloride: 106 mmol/L (ref 98–110)
Creat: 0.76 mg/dL (ref 0.50–1.10)
GFR, EST AFRICAN AMERICAN: 115 mL/min/{1.73_m2} (ref >=60)
GFR, EST NON AFRICAN AMERICAN: 99 mL/min/{1.73_m2} (ref >=60)
Globulin: 2.8 g/dL (calc) (ref 1.9–3.7)
Glucose, Bld: 90 mg/dL (ref 65–139)
POTASSIUM: 4.2 mmol/L (ref 3.5–5.3)
Sodium: 139 mmol/L (ref 135–146)
TOTAL PROTEIN: 7.2 g/dL (ref 6.1–8.1)
Total Bilirubin: 0.5 mg/dL (ref 0.2–1.2)

## 2017-11-23 LAB — CBC WITH DIFFERENTIAL/PLATELET
BASOS PCT: 1.1 %
Basophils Absolute: 59 cells/uL (ref 0–200)
EOS PCT: 2.9 %
Eosinophils Absolute: 157 cells/uL (ref 15–500)
HCT: 41.1 % (ref 35.0–45.0)
Hemoglobin: 13.8 g/dL (ref 11.7–15.5)
Lymphs Abs: 2047 cells/uL (ref 850–3900)
MCH: 29.1 pg (ref 27.0–33.0)
MCHC: 33.6 g/dL (ref 32.0–36.0)
MCV: 86.5 fL (ref 80.0–100.0)
MPV: 9.8 fL (ref 7.5–12.5)
Monocytes Relative: 6.3 %
NEUTROS PCT: 51.8 %
Neutro Abs: 2797 cells/uL (ref 1500–7800)
PLATELETS: 239 10*3/uL (ref 140–400)
RBC: 4.75 10*6/uL (ref 3.80–5.10)
RDW: 12 % (ref 11.0–15.0)
TOTAL LYMPHOCYTE: 37.9 %
WBC: 5.4 10*3/uL (ref 3.8–10.8)
WBCMIX: 340 {cells}/uL (ref 200–950)

## 2017-11-23 LAB — TSH: TSH: 1.18 m[IU]/L

## 2017-11-23 LAB — VITAMIN B12: VITAMIN B 12: 554 pg/mL (ref 200–1100)

## 2017-11-23 LAB — LIPASE: Lipase: 27 U/L (ref 7–60)

## 2017-11-23 LAB — VITAMIN D 25 HYDROXY (VIT D DEFICIENCY, FRACTURES): Vit D, 25-Hydroxy: 24 ng/mL — ABNORMAL LOW (ref 30–100)

## 2017-11-27 ENCOUNTER — Encounter: Payer: Self-pay | Admitting: Family Medicine

## 2017-11-27 DIAGNOSIS — R1013 Epigastric pain: Secondary | ICD-10-CM | POA: Diagnosis not present

## 2017-11-27 DIAGNOSIS — G8929 Other chronic pain: Secondary | ICD-10-CM | POA: Diagnosis not present

## 2017-11-28 LAB — HELICOBACTER PYLORI  SPECIAL ANTIGEN
MICRO NUMBER:: 90147013
SPECIMEN QUALITY: ADEQUATE

## 2017-12-08 ENCOUNTER — Encounter: Payer: Self-pay | Admitting: Family Medicine

## 2017-12-09 ENCOUNTER — Other Ambulatory Visit: Payer: Self-pay

## 2017-12-09 ENCOUNTER — Encounter: Payer: Self-pay | Admitting: Emergency Medicine

## 2017-12-09 ENCOUNTER — Ambulatory Visit
Admission: EM | Admit: 2017-12-09 | Discharge: 2017-12-09 | Disposition: A | Discharge status: Home / Self Care | Payer: BLUE CROSS/BLUE SHIELD | Source: Home / Self Care | Attending: Emergency Medicine | Admitting: Emergency Medicine

## 2017-12-09 ENCOUNTER — Emergency Department: Payer: BLUE CROSS/BLUE SHIELD

## 2017-12-09 ENCOUNTER — Emergency Department
Admission: EM | Admit: 2017-12-09 | Discharge: 2017-12-09 | Disposition: A | Discharge status: Home / Self Care | Payer: BLUE CROSS/BLUE SHIELD | Attending: Emergency Medicine | Admitting: Emergency Medicine

## 2017-12-09 DIAGNOSIS — J45909 Unspecified asthma, uncomplicated: Secondary | ICD-10-CM | POA: Insufficient documentation

## 2017-12-09 DIAGNOSIS — R110 Nausea: Secondary | ICD-10-CM | POA: Diagnosis not present

## 2017-12-09 DIAGNOSIS — R1031 Right lower quadrant pain: Secondary | ICD-10-CM

## 2017-12-09 DIAGNOSIS — Z85820 Personal history of malignant melanoma of skin: Secondary | ICD-10-CM | POA: Diagnosis not present

## 2017-12-09 DIAGNOSIS — Z79899 Other long term (current) drug therapy: Secondary | ICD-10-CM | POA: Insufficient documentation

## 2017-12-09 DIAGNOSIS — R102 Pelvic and perineal pain: Secondary | ICD-10-CM | POA: Diagnosis not present

## 2017-12-09 DIAGNOSIS — R1010 Upper abdominal pain, unspecified: Secondary | ICD-10-CM | POA: Diagnosis not present

## 2017-12-09 LAB — COMPREHENSIVE METABOLIC PANEL
ALBUMIN: 4.9 g/dL (ref 3.5–5.0)
ALT: 16 U/L (ref 14–54)
ANION GAP: 9 (ref 5–15)
AST: 19 U/L (ref 15–41)
Alkaline Phosphatase: 27 U/L — ABNORMAL LOW (ref 38–126)
BUN: 15 mg/dL (ref 6–20)
CHLORIDE: 105 mmol/L (ref 101–111)
CO2: 24 mmol/L (ref 22–32)
Calcium: 9.6 mg/dL (ref 8.9–10.3)
Creatinine, Ser: 0.73 mg/dL (ref 0.44–1.00)
GFR calc non Af Amer: >60 mL/min (ref >60)
GLUCOSE: 101 mg/dL — AB (ref 65–99)
POTASSIUM: 4 mmol/L (ref 3.5–5.1)
SODIUM: 138 mmol/L (ref 135–145)
Total Bilirubin: 1.1 mg/dL (ref 0.3–1.2)
Total Protein: 8.3 g/dL — ABNORMAL HIGH (ref 6.5–8.1)

## 2017-12-09 LAB — CBC
HEMATOCRIT: 42.2 % (ref 35.0–47.0)
HEMOGLOBIN: 14.5 g/dL (ref 12.0–16.0)
MCH: 29.8 pg (ref 26.0–34.0)
MCHC: 34.3 g/dL (ref 32.0–36.0)
MCV: 86.8 fL (ref 80.0–100.0)
Platelets: 222 10*3/uL (ref 150–440)
RBC: 4.87 MIL/uL (ref 3.80–5.20)
RDW: 13.1 % (ref 11.5–14.5)
WBC: 6.7 10*3/uL (ref 3.6–11.0)

## 2017-12-09 LAB — URINALYSIS, COMPLETE (UACMP) WITH MICROSCOPIC
BACTERIA UA: NONE SEEN
Bilirubin Urine: NEGATIVE
Bilirubin Urine: NEGATIVE
GLUCOSE, UA: NEGATIVE mg/dL
GLUCOSE, UA: NEGATIVE mg/dL
KETONES UR: NEGATIVE mg/dL
Ketones, ur: NEGATIVE mg/dL
Leukocytes, UA: NEGATIVE
NITRITE: NEGATIVE
Nitrite: NEGATIVE
PROTEIN: NEGATIVE mg/dL
PROTEIN: NEGATIVE mg/dL
SPECIFIC GRAVITY, URINE: 1.01 (ref 1.005–1.030)
Specific Gravity, Urine: 1.006 (ref 1.005–1.030)
pH: 6 (ref 5.0–8.0)
pH: 7 (ref 5.0–8.0)

## 2017-12-09 LAB — PREGNANCY, URINE: Preg Test, Ur: NEGATIVE

## 2017-12-09 LAB — LIPASE, BLOOD: LIPASE: 36 U/L (ref 11–51)

## 2017-12-09 MED ORDER — MORPHINE SULFATE (PF) 2 MG/ML IV SOLN
2.0000 mg | Freq: Once | INTRAVENOUS | Status: AC
  Administered 2017-12-09: 2 mg via INTRAVENOUS
  Filled 2017-12-09: qty 1

## 2017-12-09 MED ORDER — IOPAMIDOL (ISOVUE-300) INJECTION 61%
100.0000 mL | Freq: Once | INTRAVENOUS | Status: AC | PRN
  Administered 2017-12-09: 100 mL via INTRAVENOUS
  Filled 2017-12-09: qty 100

## 2017-12-09 MED ORDER — IOPAMIDOL (ISOVUE-300) INJECTION 61%
100.0000 mL | Freq: Once | INTRAVENOUS | Status: DC | PRN
  Filled 2017-12-09: qty 100

## 2017-12-09 MED ORDER — ONDANSETRON HCL 4 MG/2ML IJ SOLN
4.0000 mg | Freq: Once | INTRAMUSCULAR | Status: AC
  Administered 2017-12-09: 4 mg via INTRAVENOUS
  Filled 2017-12-09: qty 2

## 2017-12-09 MED ORDER — ONDANSETRON 4 MG PO TBDP: 4.0000 mg | ORAL_TABLET | Freq: Three times a day (TID) | ORAL | 0 refills | Status: DC | PRN

## 2017-12-09 MED ORDER — TRAMADOL HCL 50 MG PO TABS: 50.0000 mg | ORAL_TABLET | Freq: Four times a day (QID) | ORAL | 0 refills | Status: DC | PRN

## 2017-12-09 NOTE — Discharge Instructions (Signed)
Do not have anything to eat or drink until your evaluation is complete.

## 2017-12-09 NOTE — ED Provider Notes (Signed)
[facility] Emergency Department Provider Note  ____________________________________________   First MD Initiated Contact with Patient 12/09/17 1817     (approximate)  I have reviewed the triage vital signs and the nursing notes.   HISTORY  Chief Complaint Abdominal Pain   HPI Ana Knapp is a 37 y.o. female with below list of chronic medical conditions presents to the emergency department with 5 out of 10 right lower quadrant abdominal pain which began 3 days ago t.  Patient states that she has had intermittent upper abdominal pain times 3 weeks for which she saw her primary care provider and was prescribed Carafate.  Patient states that pain is improved on Carafate however after discontinuing it 3 days ago pain recurred   Past Medical History:  Diagnosis Date  . Chronic sinusitis   . Fibromyalgia   . History of melanoma 11/22/2017  . History of melanoma 2018   Back  . 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 Pulmonary embolism [facility])     Patient Active Problem List   Diagnosis Date Noted  . Nausea 11/22/2017  . PONV (postoperative nausea and vomiting) 11/22/2017  . History of melanoma 11/22/2017  . Biliary dyskinesia 12/05/2016  . Constipation 08/18/2016  . Pre-conception counseling 08/18/2016  . Vitamin B12 deficiency 08/18/2016  . Vitamin D deficiency 08/18/2016  . Shortness of breath 06/19/2016  . Abdominal pain 06/15/2016  . Microcytic hypochromic anemia 06/15/2016  . Abnormal weight gain 06/15/2016  . Fatigue 06/15/2016  . Hyperphosphatemia 11/30/2015  . May-Thurner syndrome 11/30/2015  . DVT (deep venous thrombosis) ([place]) 11/13/2015  . History of pulmonary embolism 11/13/2015  . Asthma exacerbation attacks 11/04/2015  . PE (pulmonary thromboembolism) ([place]) 10/25/2015  . Sinus problem 10/13/2015  . Abdominal pain, chronic, epigastric 09/28/2015  . GERD (gastroesophageal reflux disease)  09/28/2015  . Flushing 09/24/2015  . Fibromyalgia 08/30/2015  . Seasonal allergic rhinitis 08/30/2015  . Alopecia 08/30/2015    Past Surgical History:  Procedure Laterality Date  . ANGIOPLASTY / STENTING FEMORAL  March 2017  . CHOLECYSTECTOMY    . Lytics Catheter Placement  11/16/15  . MELANOMA EXCISION  2018   Back  . ROBOTIC ASSISTED LAPAROSCOPIC CHOLECYSTECTOMY-SINGLE SITE  12/29/2016    Prior to Admission medications   Medication Sig Start Date End Date Taking? Authorizing Provider  acetaminophen (TYLENOL) 325 MG tablet Take 650 mg by mouth every 6 (six) hours as needed.    [provider]  loratadine (CLARITIN) 10 MG tablet Take 10 mg by mouth daily.    [provider]  montelukast (SINGULAIR) 10 MG tablet Take 1 tablet (10 mg total) by mouth at bedtime. 12/21/16   Arnetha Courser, MD  ondansetron (ZOFRAN ODT) 4 MG disintegrating tablet Take 1 tablet (4 mg total) by mouth every 8 (eight) hours as needed for nausea or vomiting. 07/19/17   Hubbard Hartshorn, FNP  ondansetron (ZOFRAN ODT) 4 MG disintegrating tablet Take 1 tablet (4 mg total) by mouth every 8 (eight) hours as needed for nausea or vomiting. 12/09/17   Gregor Hams, MD  pantoprazole (PROTONIX) 40 MG tablet Take 1 tablet (40 mg total) by mouth 2 (two) times daily before a meal. 11/22/17   Lada, Satira Anis, MD  polyethylene glycol (MIRALAX / GLYCOLAX) packet Take 17 g by mouth daily as needed.    [provider]  PREVIDENT 5000 ENAMEL PROTECT 1.1-5 % PSTE  11/02/17   [provider]  sucralfate (CARAFATE) 1 g tablet Take 1 tablet (1 g total) by mouth 4 (four) times daily -  with meals and at bedtime. 11/22/17   Lada, Satira Anis, MD  traMADol (ULTRAM) 50 MG tablet Take 1 tablet (50 mg total) by mouth every 6 (six) hours as needed. 12/09/17 12/09/18  Gregor Hams, MD    Allergies Ortho tri-cyclen [norgestimate-eth estradiol]; Prednisone; and Augmentin [amoxicillin-pot clavulanate]  Family  History  Problem Relation Age of Onset  . Thyroid disease Mother   . Diabetes Mother   . Cancer Father        bladder  . Hypertension Maternal Grandmother   . Thyroid disease Paternal Grandmother   . Diabetes Paternal Grandmother   . Stroke Paternal Grandmother   . Cancer Paternal Grandfather        skin  . Heart disease Neg Hx   . COPD Neg Hx     Social History Social History   Tobacco Use  . Smoking status: Never Smoker  . Smokeless tobacco: Never Used  Substance Use Topics  . Alcohol use: Yes    Comment: rare  . Drug use: No    Review of Systems Constitutional: No fever/chills Eyes: No visual changes. ENT: No sore throat. Cardiovascular: Denies chest pain. Respiratory: Denies shortness of breath. Gastrointestinal: Positive for abdominal pain and nausea no vomiting.  No diarrhea.  No constipation. Genitourinary: Negative for dysuria. Musculoskeletal: Negative for neck pain.  Negative for back pain. Integumentary: Negative for rash. Neurological: Negative for headaches, focal weakness or numbness.   ____________________________________________   PHYSICAL EXAM:  VITAL SIGNS: ED Triage Vitals  Enc Vitals Group     BP 12/09/17 1619 123/73     Pulse Rate 12/09/17 1619 89     Resp 12/09/17 1619 16     Temp 12/09/17 1619 98.9 F (37.2 C)     Temp Source 12/09/17 1619 Oral     SpO2 12/09/17 1619 99 %     Weight 12/09/17 1620 72.6 kg (160 lb)     Height --      Head Circumference --      Peak Flow --      Pain Score 12/09/17 1620 0     Pain Loc --      Pain Edu? --      Excl. in [place]? --     Constitutional: Alert and oriented. Well appearing and in no acute distress. Eyes: Conjunctivae are normal.  Head: Atraumatic. Mouth/Throat: Mucous membranes are moist.  Oropharynx non-erythematous. Neck: No stridor.   Cardiovascular: Normal rate, regular rhythm. Good peripheral circulation. Grossly normal heart sounds. Respiratory: Normal respiratory effort.  No  retractions. Lungs CTAB. Gastrointestinal: Left upper quadrant/right lower/left lower quadrant tenderness to palpation no distention.  Musculoskeletal: No lower extremity tenderness nor edema. No gross deformities of extremities. Neurologic:  Normal speech and language. No gross focal neurologic deficits are appreciated.  Skin:  Skin is warm, dry and intact. No rash noted. Psychiatric: Mood and affect are normal. Speech and behavior are normal.  ____________________________________________   LABS (all labs ordered are listed, but only abnormal results are displayed)  Labs Reviewed  COMPREHENSIVE METABOLIC PANEL - Abnormal; Notable for the following components:      Result Value   Glucose, Bld 101 (*)    Total Protein 8.3 (*)    Alkaline Phosphatase 27 (*)    All other components within normal limits  URINALYSIS, COMPLETE (UACMP) WITH MICROSCOPIC - Abnormal; Notable for the following components:  Color, Urine STRAW (*)    APPearance CLEAR (*)    Hgb urine dipstick SMALL (*)    Squamous Epithelial / LPF 0-5 (*)    All other components within normal limits  LIPASE, BLOOD  CBC   __________________  RADIOLOGY I, Val[place] N BROWN, personally viewed and evaluated these images (plain radiographs) as part of my medical decision making, as well as reviewing the written report by the radiologist.    Official radiology report(s): Ct Abdomen Pelvis W Contrast  Result Date: 12/09/2017 CLINICAL DATA:  37 y/o F; lower abdominal and pelvic pain for several days. Nausea. EXAM: CT ABDOMEN AND PELVIS WITH CONTRAST TECHNIQUE: Multidetector CT imaging of the abdomen and pelvis was performed using the standard protocol following bolus administration of intravenous contrast. CONTRAST:  169mL ISOVUE-300 IOPAMIDOL (ISOVUE-300) INJECTION 61% COMPARISON:  12/12/2016 MRI of the abdomen. FINDINGS: Lower chest: Stable scarring in the right posterior lower lobe. Hepatobiliary: No focal liver abnormality. Mild  prominence of the intrahepatic bile ducts, likely compensatory post cholecystectomy. Pancreas: Unremarkable. No pancreatic ductal dilatation or surrounding inflammatory changes. Spleen: Normal in size without focal abnormality. Adrenals/Urinary Tract: Adrenal glands are unremarkable. Kidneys are normal, without renal calculi, focal lesion, or hydronephrosis. Bladder is unremarkable. Stomach/Bowel: Stomach is within normal limits. Appendix appears normal. No evidence of bowel wall thickening, distention, or inflammatory changes. Vascular/Lymphatic: No significant vascular findings are present. Stable left common iliac vein stent. No enlarged abdominal or pelvic lymph nodes. Reproductive: Uterus and bilateral adnexa are unremarkable. Other: No abdominal wall hernia or abnormality. Faint mesenteric edema trace ascites in the right lower quadrant. Musculoskeletal: No acute or significant osseous findings. IMPRESSION: 1. Mild mesenteric edema and trace ascites in the right lower quadrant of uncertain significance. Findings may be related to underlying occult inflammation infection. 2. Otherwise unremarkable CT of abdomen pelvis. Electronically Signed   By: Kristine Garbe M.D.   On: 12/09/2017 20:40      Procedures   ____________________________________________   INITIAL IMPRESSION / ASSESSMENT AND PLAN / ED COURSE  As part of my medical decision making, I reviewed the following data within the electronic MEDICAL RECORD [id number] year old female presented with above-stated history and physical exam secondary to abdominal pain.  Concern for possible appendicitis versus colitis versus diverticulitis or other potential intra-abdominal pathology and as such CT scan of the abdomen was performed which revealed mild mesenteric edema and trace ascites noted in the right lower quadrant.  However appendix was reported as normal per the radiologist.  Spoke with the patient at length regarding follow-up and warning  signs that would warrant immediate return to the emergency department.    ____________________________________________  FINAL CLINICAL IMPRESSION(S) / ED DIAGNOSES  Final diagnoses:  Right lower quadrant abdominal pain     MEDICATIONS GIVEN DURING THIS VISIT:  Medications  morphine 2 MG/ML injection 2 mg (2 mg Intravenous Given 12/09/17 1915)  ondansetron (ZOFRAN) injection 4 mg (4 mg Intravenous Given 12/09/17 1915)  iopamidol (ISOVUE-300) 61 % injection 100 mL (100 mLs Intravenous Contrast Given 12/09/17 2002)     ED Discharge Orders        Ordered    traMADol (ULTRAM) 50 MG tablet  Every 6 hours PRN     12/09/17 2132    ondansetron (ZOFRAN ODT) 4 MG disintegrating tablet  Every 8 hours PRN     12/09/17 2132       Note:  This document was prepared using Dragon voice recognition software and may include unintentional dictation errors.  Gregor Hams, MD 12/09/17 2156

## 2017-12-09 NOTE — ED Notes (Signed)
Patient transported to CT 

## 2017-12-09 NOTE — ED Notes (Addendum)
Pt alert, oriented, ambulatory. States RLQ pain, states not hurting bad at this time. Still has appendix, no gallbladder. Nausea last night but no vomiting. No diarrhea. No fevers. States pain comes and goes.

## 2017-12-09 NOTE — ED Provider Notes (Addendum)
HPI  SUBJECTIVE:  Ana Knapp is a 37 y.o. female who presents with burning left lower quadrant and right lower quadrant pain starting last night.  She says that it radiates to her back.  She states that started approximately 2 hours after eating and was accompanied by nausea.  She states that she feels bloated.  She states that this belly pain is worse with movement, walking, lying on her side better with lying down flat.  He has not tried anything for this.  She also reports urinary urgency and cloudy urine for the past few days.  She has had intermittent, hours long burning epigastric pain for the past 3 or 4 weeks, which was better with Carafate and Protonix twice a day, but states that this lower abdominal pain is new, and this is what brings her in today.  States that the car ride over here was not painful.  This lower abdominal pain does not appear to be associated with urination, defecation, eating or fasting.  She denies vaginal complaints, dyspareunia.  She had 3 normal bowel movements last night.  Normally stools every 2-3 days.  She takes one fourth MiraLAX daily.  She has not changed the dosing of this recently.  She has a past medical history of left lower extremity DVT/PE 2017, currently not on any anticoagulants.  Also fibromyalgia.  She does not take any narcotics for this.  She is status post lap chole 2018, also has a history of GERD.  No other abdominal surgeries, pancreatitis, atrial fibrillation, mesenteric ischemia, alcohol abuse, NSAID use, PUD, H. pylori, UTI, pyelonephritis, nephrolithiasis, ectopic pregnancy, PID.  No history of STDs, BV, yeast.  No history of ovarian cyst, PCOS.  She is in a long-term monogamous relationship with her husband, who is asymptomatic.  STDs are not a concern today.  LMP: 1/30.  Does not use contraception.  PMD: Dr. Sanda Klein  Seen by PMD on 1/30 for the same, found have chronic epigastric pain, her PPI was doubled, H. pylori, CBC, CMP, lipase done,  all of which were normal.   Past Medical History:  Diagnosis Date  . Chronic sinusitis   . Fibromyalgia   . History of melanoma 11/22/2017  . History of melanoma 2018   Back  . 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 Pulmonary embolism [facility])     Past Surgical History:  Procedure Laterality Date  . ANGIOPLASTY / STENTING FEMORAL  March 2017  . CHOLECYSTECTOMY    . Lytics Catheter Placement  11/16/15  . MELANOMA EXCISION  2018   Back  . ROBOTIC ASSISTED LAPAROSCOPIC CHOLECYSTECTOMY-SINGLE SITE  12/29/2016    Family History  Problem Relation Age of Onset  . Thyroid disease Mother   . Diabetes Mother   . Cancer Father        bladder  . Hypertension Maternal Grandmother   . Thyroid disease Paternal Grandmother   . Diabetes Paternal Grandmother   . Stroke Paternal Grandmother   . Cancer Paternal Grandfather        skin  . Heart disease Neg Hx   . COPD Neg Hx     Social History   Tobacco Use  . Smoking status: Never Smoker  . Smokeless tobacco: Never Used  Substance Use Topics  . Alcohol use: Yes    Comment: rare  . Drug use: No    No current facility-administered medications for this encounter.   Current Outpatient Medications:  .  acetaminophen (  TYLENOL) 325 MG tablet, Take 650 mg by mouth every 6 (six) hours as needed., Disp: , Rfl:  .  loratadine (CLARITIN) 10 MG tablet, Take 10 mg by mouth daily., Disp: , Rfl:  .  montelukast (SINGULAIR) 10 MG tablet, Take 1 tablet (10 mg total) by mouth at bedtime., Disp: 30 tablet, Rfl: 11 .  ondansetron (ZOFRAN ODT) 4 MG disintegrating tablet, Take 1 tablet (4 mg total) by mouth every 8 (eight) hours as needed for nausea or vomiting., Disp: 20 tablet, Rfl: 0 .  pantoprazole (PROTONIX) 40 MG tablet, Take 1 tablet (40 mg total) by mouth 2 (two) times daily before a meal., Disp: 60 tablet, Rfl: 0 .  polyethylene glycol (MIRALAX / GLYCOLAX) packet, Take 17 g by mouth daily as needed., Disp: ,  Rfl:  .  PREVIDENT 5000 ENAMEL PROTECT 1.1-5 % PSTE, , Disp: , Rfl: 0 .  sucralfate (CARAFATE) 1 g tablet, Take 1 tablet (1 g total) by mouth 4 (four) times daily -  with meals and at bedtime., Disp: 56 tablet, Rfl: 0  Allergies  Allergen Reactions  . Ortho Tri-Cyclen [Norgestimate-Eth Estradiol] Other (See Comments)    Blood clot and PE  . Prednisone Anaphylaxis  . Augmentin [Amoxicillin-Pot Clavulanate] Other (See Comments)    Upsets her stomach     ROS  As noted in HPI.   Physical Exam  BP (!) 119/52 (BP Location: Left Arm)   Pulse 82   Temp 98 F (36.7 C) (Oral)   Resp 16   Ht 5\' 9"  (1.753 m)   Wt 160 lb (72.6 kg)   LMP 11/22/2017   SpO2 100%   BMI 23.63 kg/m   Constitutional: Well developed, well nourished, no acute distress Eyes:  EOMI, conjunctiva normal bilaterally HENT: Normocephalic, atraumatic,mucus membranes moist Respiratory: Normal inspiratory effort Cardiovascular: Normal rate GI: nondistended.  Positive lap chole scars around the umbilicus.  Otherwise soft.  Positive suprapubic, right lower quadrant mild left lower quadrant tenderness.  No guarding, rebound.  Active bowel sounds.  Negative Rovsing.  Tap table test negative. skin: No rash, skin intact Musculoskeletal: no deformities Neurologic: Alert & oriented x 3, no focal neuro deficits Psychiatric: Speech and behavior appropriate   ED Course   Medications - No data to display  Orders Placed This Encounter  Procedures  . Urine culture    Standing Status:   Standing    Number of Occurrences:   1    Order Specific Question:   Patient immune status    Answer:   Normal  . Urinalysis, Complete w Microscopic    Standing Status:   Standing    Number of Occurrences:   1  . Pregnancy, urine    Standing Status:   Standing    Number of Occurrences:   1    Results for orders placed or performed during the hospital encounter of 12/09/17 (from the past 24 hour(s))  Urinalysis, Complete w  Microscopic     Status: Abnormal   Collection Time: 12/09/17  2:32 PM  Result Value Ref Range   Color, Urine YELLOW YELLOW   APPearance CLOUDY (A) CLEAR   Specific Gravity, Urine 1.010 1.005 - 1.030   pH 6.0 5.0 - 8.0   Glucose, UA NEGATIVE NEGATIVE mg/dL   Hgb urine dipstick TRACE (A) NEGATIVE   Bilirubin Urine NEGATIVE NEGATIVE   Ketones, ur NEGATIVE NEGATIVE mg/dL   Protein, ur NEGATIVE NEGATIVE mg/dL   Nitrite NEGATIVE NEGATIVE   Leukocytes, UA SMALL (A) NEGATIVE  Squamous Epithelial / LPF 6-30 (A) NONE SEEN   WBC, UA 6-30 0 - 5 WBC/hpf   RBC / HPF 0-5 0 - 5 RBC/hpf   Bacteria, UA MANY (A) NONE SEEN  Pregnancy, urine     Status: None   Collection Time: 12/09/17  2:32 PM  Result Value Ref Range   Preg Test, Ur NEGATIVE NEGATIVE   No results found.  ED Clinical Impression  Right lower quadrant abdominal pain   ED Assessment/Plan  Outside records and labs reviewed as noted in HPI.  Urine contaminated.  She has trace leukocytes, WBCs, many bacteria, or this does not explain her right lower quadrant tenderness on exam.  Transferring to the [facility] ED per patient preference for a comprehensive evaluation.  Feel that patient is stable to go by private vehicle.  Advised her to be n.p.o. until her evaluation is complete.  Pt called and changed her mind.  She wishes to go to [facility] ED.  Will have staff notify ED.   Discussed labs, MDM, rationale for transfer to the ED with the patient.  She agrees with plan.  No orders of the defined types were placed in this encounter.   *This clinic note was created using Dragon dictation software. Therefore, there may be occasional mistakes despite careful proofreading.   ?   Melynda Ripple, MD 12/09/17 1518    Melynda Ripple, MD 12/09/17 [phone]

## 2017-12-09 NOTE — ED Triage Notes (Signed)
Pt with upper and lower abd pain x 2 weeks ago. Seen by PCP and put on Carafate. Off Carafate x past few days and now pain has returned. Was helping when she was taking it. Pain 5/10

## 2017-12-09 NOTE — ED Triage Notes (Signed)
Pt to ed from [facility] urgent care.  Pt states she has been having lower abd/pelvic pain generalized x several days.  Pt sent for eval. +nausea, denies vomiting, denies diarrhea.  Denies vaginal discharge, denies pain with urination, however reports cloudy urine.

## 2017-12-11 ENCOUNTER — Telehealth: Payer: Self-pay | Admitting: Family Medicine

## 2017-12-11 DIAGNOSIS — R109 Unspecified abdominal pain: Secondary | ICD-10-CM

## 2017-12-11 DIAGNOSIS — R935 Abnormal findings on diagnostic imaging of other abdominal regions, including retroperitoneum: Secondary | ICD-10-CM

## 2017-12-11 LAB — URINE CULTURE: Special Requests: NORMAL

## 2017-12-11 MED ORDER — SUCRALFATE 1 G PO TABS: 1.0000 g | ORAL_TABLET | Freq: Three times a day (TID) | ORAL | 0 refills | Status: DC

## 2017-12-11 NOTE — Telephone Encounter (Signed)
Copied from [place] [phone]. Topic: Appointment Scheduling - Scheduling Inquiry for Clinic >> Dec 11, 2017  9:23 AM Scherrie Gerlach wrote: Reason for CRM:   Pt went to the ED on Sat due to stomach pain. Pt states they dx her with inflammation in her colon.  Pt states she doesn't know what to do because she is still having stomach pain, fatigue, loss of appetite . Pt wanted to see Dr Sanda Klein today. No appointments. Pt saw Dr Sanda Klein for stomach pain 1/29 and was prescribed  sucralfate (CARAFATE) 1 g tablet  Pt states this medicine did help, and if the dr wants to refill this med, that really seemed to help. Pt states the hospital did not seem to give her any answers, and she hopes Dr Sanda Klein can work her in or advise.  (Pt did send a mychart message 2/15, this was prior to [facility] and ED visit)   [id number], [place] - [address] [phone] (Phone) [phone] (Fax

## 2017-12-11 NOTE — Telephone Encounter (Signed)
I reviewed ER notes I put in referral I called patient "My stomach's bothering me" The carafate helped so we'll continue that Continue PPI Avoid trigger foods Will avoid NSAIDs given her symptoms Reviewed CBC, no elevated WBC so inflammation more likely No fevers or blood in stool She'll see GI hopefully soon She thanked me for the call

## 2017-12-11 NOTE — Telephone Encounter (Signed)
I think she needs to see GI Dr. Marius Ditch or Albertina Parr may be able to see her quickly  Please put in referral if she agrees Thank you Dx abdominal pain, abnormal CT

## 2017-12-11 NOTE — Telephone Encounter (Signed)
Pt has been notified of Dr. Delight Ovens notes but she would like to know of what she can do in the mean time for her inflammation, she is on board w/ seeing a GI so contact pt to inform of what all needs to be done

## 2017-12-12 ENCOUNTER — Encounter: Payer: Self-pay | Admitting: Family Medicine

## 2017-12-12 NOTE — Telephone Encounter (Signed)
I spoke with the patient yesterday Patient's needs already handled

## 2017-12-13 NOTE — Telephone Encounter (Signed)
Letter to patient

## 2017-12-18 ENCOUNTER — Ambulatory Visit (INDEPENDENT_AMBULATORY_CARE_PROVIDER_SITE_OTHER): Payer: BLUE CROSS/BLUE SHIELD | Admitting: Gastroenterology

## 2017-12-18 ENCOUNTER — Other Ambulatory Visit: Payer: Self-pay

## 2017-12-18 ENCOUNTER — Encounter: Payer: Self-pay | Admitting: Gastroenterology

## 2017-12-18 ENCOUNTER — Encounter: Payer: Self-pay | Admitting: Family Medicine

## 2017-12-18 VITALS — BP 121/65 | HR 87 | Ht 69.0 in | Wt 157.5 lb

## 2017-12-18 DIAGNOSIS — R1031 Right lower quadrant pain: Secondary | ICD-10-CM | POA: Diagnosis not present

## 2017-12-18 DIAGNOSIS — R9389 Abnormal findings on diagnostic imaging of other specified body structures: Secondary | ICD-10-CM

## 2017-12-18 DIAGNOSIS — G8929 Other chronic pain: Secondary | ICD-10-CM

## 2017-12-18 DIAGNOSIS — R1013 Epigastric pain: Secondary | ICD-10-CM

## 2017-12-18 NOTE — Progress Notes (Signed)
Gastroenterology Consultation  Referring Provider:     Arnetha Courser, MD Primary Care Physician:  Arnetha Courser, MD Primary Gastroenterologist:  Dr. Allen Norris     Reason for Consultation:     Epigastric pain        HPI:   Ana Knapp is a 37 y.o. y/o female referred for consultation & management of Gastric pain by Dr. Sanda Klein, Satira Anis, MD.  This patient comes in today with a long-standing history of epigastric pain.  The patient had pain in the right lower quadrant and went to the emergency room.  In the emergency room the patient had a CT scan that showed her to have some mesenteric inflammation it was reported as mild.  The patient also was noted to have some free fluid in the right side of her abdomen that was also of small amounts.  It was postulated that this may be an occult inflammatory or infectious process.  The patient reports that her right-sided pain has not been her biggest problem and that her epigastric pain has been bothering her more.  The patient is now on Protonix twice a day without relief.  The patient states that she still has acid reflux when she lays down at night.  There is no report of any unexplained weight loss fevers chills or vomiting.  The patient does report that she has some nausea.  The patient also reports that she has suffered from chronic constipation with some bright red blood per rectum but denies any episodes of bloody diarrhea.  She also denies any family history of colon cancer colon polyps.  There is no report of any family history of inflammatory bowel disease either.  Past Medical History:  Diagnosis Date  . Chronic sinusitis   . Fibromyalgia   . History of melanoma 11/22/2017  . History of melanoma 2018   Back  . May-Thurner syndrome 11/30/2015   January 2017; 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[facility]   . Pulmonary embolism [facility])     Past Surgical History:  Procedure Laterality Date  . ANGIOPLASTY / STENTING FEMORAL  March 2017  . CHOLECYSTECTOMY    .  Lytics Catheter Placement  11/16/15  . MELANOMA EXCISION  2018   Back  . ROBOTIC ASSISTED LAPAROSCOPIC CHOLECYSTECTOMY-SINGLE SITE  12/29/2016    Prior to Admission medications   Medication Sig Start Date End Date Taking? Authorizing Provider  acetaminophen (TYLENOL) 325 MG tablet Take 650 mg by mouth every 6 (six) hours as needed.   Yes [provider]  loratadine (CLARITIN) 10 MG tablet Take 10 mg by mouth daily.   Yes [provider]  montelukast (SINGULAIR) 10 MG tablet Take 1 tablet (10 mg total) by mouth at bedtime. 12/21/16  Yes Lada, Satira Anis, MD  ondansetron (ZOFRAN ODT) 4 MG disintegrating tablet Take 1 tablet (4 mg total) by mouth every 8 (eight) hours as needed for nausea or vomiting. 07/19/17  Yes Hubbard Hartshorn, FNP  ondansetron (ZOFRAN ODT) 4 MG disintegrating tablet Take 1 tablet (4 mg total) by mouth every 8 (eight) hours as needed for nausea or vomiting. 12/09/17  Yes Gregor Hams, MD  pantoprazole (PROTONIX) 40 MG tablet Take 1 tablet (40 mg total) by mouth 2 (two) times daily before a meal. 11/22/17  Yes Lada, Satira Anis, MD  polyethylene glycol (MIRALAX / GLYCOLAX) packet Take 17 g by mouth daily as needed.   Yes [provider]  PREVIDENT 5000 ENAMEL PROTECT 1.1-5 % PSTE  11/02/17  Yes [provider]  sucralfate (CARAFATE) 1 g tablet Take 1 tablet (1 g total) by mouth 4 (four) times daily -  with meals and at bedtime. 12/11/17  Yes Lada, Satira Anis, MD  traMADol (ULTRAM) 50 MG tablet Take 1 tablet (50 mg total) by mouth every 6 (six) hours as needed. 12/09/17 12/09/18 Yes Gregor Hams, MD    Family History  Problem Relation Age of Onset  . Thyroid disease Mother   . Diabetes Mother   . Cancer Father        bladder  . Hypertension Maternal Grandmother   . Thyroid disease Paternal Grandmother   . Diabetes Paternal Grandmother   . Stroke Paternal Grandmother   . Cancer Paternal Grandfather        skin  . Heart disease Neg Hx   .  COPD Neg Hx      Social History   Tobacco Use  . Smoking status: Never Smoker  . Smokeless tobacco: Never Used  Substance Use Topics  . Alcohol use: Yes    Comment: rare  . Drug use: No    Allergies as of 12/18/2017 - Review Complete 12/18/2017  Allergen Reaction Noted  . Ortho tri-cyclen [norgestimate-eth estradiol] Other (See Comments) 11/30/2015  . Prednisone Anaphylaxis 11/12/2015  . Augmentin [amoxicillin-pot clavulanate] Other (See Comments) 08/26/2015    Review of Systems:    All systems reviewed and negative except where noted in HPI.   Physical Exam:  BP 121/65   Pulse 87   Ht 5\' 9"  (1.753 m)   Wt 157 lb 8 oz (71.4 kg)   LMP 11/22/2017 Comment: neg upt  BMI 23.26 kg/m  Patient's last menstrual period was 11/22/2017. Psych:  Alert and cooperative. Normal mood and affect. General:   Alert,  Well-developed, well-nourished, pleasant and cooperative in NAD Head:  Normocephalic and atraumatic. Eyes:  Sclera clear, no icterus.   Conjunctiva pink. Ears:  Normal auditory acuity. Nose:  No deformity, discharge, or lesions. Mouth:  No deformity or lesions,oropharynx pink & moist. Neck:  Supple; no masses or thyromegaly. Lungs:  Respirations even and unlabored.  Clear throughout to auscultation.   No wheezes, crackles, or rhonchi. No acute distress. Heart:  Regular rate and rhythm; no murmurs, clicks, rubs, or gallops. Abdomen:  Normal bowel sounds.  No bruits.  Soft, Mild epigastric tenderness and non-distended without masses, hepatosplenomegaly or hernias noted.  No guarding or rebound tenderness.  Negative Carnett sign.   Rectal:  Deferred.  Msk:  Symmetrical without gross deformities.  Good, equal movement & strength bilaterally. Pulses:  Normal pulses noted. Extremities:  No clubbing or edema.  No cyanosis. Neurologic:  Alert and oriented x3;  grossly normal neurologically. Skin:  Intact without significant lesions or rashes.  No jaundice. Lymph Nodes:  No  significant cervical adenopathy. Psych:  Alert and cooperative. Normal mood and affect.  Imaging Studies: Ct Abdomen Pelvis W Contrast  Result Date: 12/09/2017 CLINICAL DATA:  37 y/o F; lower abdominal and pelvic pain for several days. Nausea. EXAM: CT ABDOMEN AND PELVIS WITH CONTRAST TECHNIQUE: Multidetector CT imaging of the abdomen and pelvis was performed using the standard protocol following bolus administration of intravenous contrast. CONTRAST:  171mL ISOVUE-300 IOPAMIDOL (ISOVUE-300) INJECTION 61% COMPARISON:  12/12/2016 MRI of the abdomen. FINDINGS: Lower chest: Stable scarring in the right posterior lower lobe. Hepatobiliary: No focal liver abnormality. Mild prominence of the intrahepatic bile ducts, likely compensatory post cholecystectomy. Pancreas: Unremarkable. No pancreatic ductal dilatation or surrounding inflammatory  changes. Spleen: Normal in size without focal abnormality. Adrenals/Urinary Tract: Adrenal glands are unremarkable. Kidneys are normal, without renal calculi, focal lesion, or hydronephrosis. Bladder is unremarkable. Stomach/Bowel: Stomach is within normal limits. Appendix appears normal. No evidence of bowel wall thickening, distention, or inflammatory changes. Vascular/Lymphatic: No significant vascular findings are present. Stable left common iliac vein stent. No enlarged abdominal or pelvic lymph nodes. Reproductive: Uterus and bilateral adnexa are unremarkable. Other: No abdominal wall hernia or abnormality. Faint mesenteric edema trace ascites in the right lower quadrant. Musculoskeletal: No acute or significant osseous findings. IMPRESSION: 1. Mild mesenteric edema and trace ascites in the right lower quadrant of uncertain significance. Findings may be related to underlying occult inflammation infection. 2. Otherwise unremarkable CT of abdomen pelvis. Electronically Signed   By: Kristine Garbe M.D.   On: 12/09/2017 20:40    Assessment and Plan:   Ana Knapp is a 37 y.o. y/o female who comes in today with a history of epigastric discomfort and chronic heartburn that has not been helped by her Protonix twice a day.  The patient has been given samples of Dexilant and will take that 3 hours before she goes to sleep.  The patient has also been told that since she has had chronic heartburn she should undergo a upper endoscopy to rule out Barrett's esophagus.  The patient also will be set up for colonoscopy due to her mesenteric thickening with fluid in the right lower quadrant.  The colonoscopy will be done to rule out inflammatory bowel disease. I have discussed risks & benefits which include, but are not limited to, bleeding, infection, perforation & drug reaction.  The patient agrees with this plan & written consent will be obtained.     Lucilla Lame, MD. Marval Regal   Note: This dictation was prepared with Dragon dictation along with smaller phrase technology. Any transcriptional errors that result from this process are unintentional.

## 2017-12-19 ENCOUNTER — Other Ambulatory Visit: Payer: Self-pay

## 2017-12-19 DIAGNOSIS — R9389 Abnormal findings on diagnostic imaging of other specified body structures: Secondary | ICD-10-CM

## 2017-12-19 MED ORDER — MONTELUKAST SODIUM 10 MG PO TABS: 10.0000 mg | ORAL_TABLET | Freq: Every day | ORAL | 11 refills | Status: DC

## 2017-12-20 ENCOUNTER — Other Ambulatory Visit: Payer: Self-pay

## 2017-12-20 ENCOUNTER — Encounter: Payer: Self-pay | Admitting: *Deleted

## 2017-12-21 ENCOUNTER — Encounter: Payer: Self-pay | Admitting: Anesthesiology

## 2017-12-21 ENCOUNTER — Emergency Department: Payer: BLUE CROSS/BLUE SHIELD

## 2017-12-21 ENCOUNTER — Other Ambulatory Visit: Payer: Self-pay

## 2017-12-21 ENCOUNTER — Emergency Department
Admission: EM | Admit: 2017-12-21 | Discharge: 2017-12-21 | Disposition: A | Discharge status: Home / Self Care | Payer: BLUE CROSS/BLUE SHIELD | Attending: Emergency Medicine | Admitting: Emergency Medicine

## 2017-12-21 ENCOUNTER — Encounter: Payer: Self-pay | Admitting: Family Medicine

## 2017-12-21 ENCOUNTER — Telehealth: Payer: Self-pay | Admitting: Gastroenterology

## 2017-12-21 DIAGNOSIS — Z79899 Other long term (current) drug therapy: Secondary | ICD-10-CM | POA: Insufficient documentation

## 2017-12-21 DIAGNOSIS — R200 Anesthesia of skin: Secondary | ICD-10-CM | POA: Diagnosis not present

## 2017-12-21 DIAGNOSIS — R1013 Epigastric pain: Secondary | ICD-10-CM | POA: Diagnosis not present

## 2017-12-21 DIAGNOSIS — I82432 Acute embolism and thrombosis of left popliteal vein: Secondary | ICD-10-CM | POA: Diagnosis not present

## 2017-12-21 DIAGNOSIS — Q969 Turner's syndrome, unspecified: Secondary | ICD-10-CM | POA: Diagnosis not present

## 2017-12-21 DIAGNOSIS — R1030 Lower abdominal pain, unspecified: Secondary | ICD-10-CM | POA: Diagnosis not present

## 2017-12-21 DIAGNOSIS — M79662 Pain in left lower leg: Secondary | ICD-10-CM | POA: Diagnosis not present

## 2017-12-21 DIAGNOSIS — R202 Paresthesia of skin: Secondary | ICD-10-CM | POA: Insufficient documentation

## 2017-12-21 LAB — CBC WITH DIFFERENTIAL/PLATELET
BASOS ABS: 0 10*3/uL (ref 0–0.1)
BASOS PCT: 1 %
EOS PCT: 3 %
Eosinophils Absolute: 0.2 10*3/uL (ref 0–0.7)
HCT: 40.4 % (ref 35.0–47.0)
Hemoglobin: 13.5 g/dL (ref 12.0–16.0)
Lymphocytes Relative: 31 %
Lymphs Abs: 1.8 10*3/uL (ref 1.0–3.6)
MCH: 29.3 pg (ref 26.0–34.0)
MCHC: 33.3 g/dL (ref 32.0–36.0)
MCV: 88.1 fL (ref 80.0–100.0)
MONO ABS: 0.5 10*3/uL (ref 0.2–0.9)
Monocytes Relative: 8 %
Neutro Abs: 3.3 10*3/uL (ref 1.4–6.5)
Neutrophils Relative %: 57 %
PLATELETS: 217 10*3/uL (ref 150–440)
RBC: 4.59 MIL/uL (ref 3.80–5.20)
RDW: 13.1 % (ref 11.5–14.5)
WBC: 5.9 10*3/uL (ref 3.6–11.0)

## 2017-12-21 LAB — URINALYSIS, COMPLETE (UACMP) WITH MICROSCOPIC
BILIRUBIN URINE: NEGATIVE
Glucose, UA: NEGATIVE mg/dL
KETONES UR: NEGATIVE mg/dL
LEUKOCYTES UA: NEGATIVE
Nitrite: NEGATIVE
PROTEIN: NEGATIVE mg/dL
Specific Gravity, Urine: 1.004 — ABNORMAL LOW (ref 1.005–1.030)
pH: 5 (ref 5.0–8.0)

## 2017-12-21 LAB — COMPREHENSIVE METABOLIC PANEL
ALBUMIN: 4.5 g/dL (ref 3.5–5.0)
ALT: 13 U/L — ABNORMAL LOW (ref 14–54)
AST: 19 U/L (ref 15–41)
Alkaline Phosphatase: 27 U/L — ABNORMAL LOW (ref 38–126)
Anion gap: 8 (ref 5–15)
BUN: 15 mg/dL (ref 6–20)
CHLORIDE: 105 mmol/L (ref 101–111)
CO2: 25 mmol/L (ref 22–32)
Calcium: 9.2 mg/dL (ref 8.9–10.3)
Creatinine, Ser: 0.72 mg/dL (ref 0.44–1.00)
GFR calc Af Amer: >60 mL/min (ref >60)
GFR calc non Af Amer: >60 mL/min (ref >60)
GLUCOSE: 104 mg/dL — AB (ref 65–99)
POTASSIUM: 3.5 mmol/L (ref 3.5–5.1)
Sodium: 138 mmol/L (ref 135–145)
Total Bilirubin: 0.6 mg/dL (ref 0.3–1.2)
Total Protein: 7.8 g/dL (ref 6.5–8.1)

## 2017-12-21 LAB — POCT PREGNANCY, URINE: PREG TEST UR: NEGATIVE

## 2017-12-21 LAB — TROPONIN I

## 2017-12-21 MED ORDER — GADOBENATE DIMEGLUMINE 529 MG/ML IV SOLN
15.0000 mL | Freq: Once | INTRAVENOUS | Status: AC | PRN
  Administered 2017-12-21: 15 mL via INTRAVENOUS

## 2017-12-21 MED ORDER — IOPAMIDOL (ISOVUE-370) INJECTION 76%
75.0000 mL | Freq: Once | INTRAVENOUS | Status: AC | PRN
  Administered 2017-12-21: 75 mL via INTRAVENOUS

## 2017-12-21 MED ORDER — SODIUM CHLORIDE 0.9 % IV BOLUS (SEPSIS)
1000.0000 mL | Freq: Once | INTRAVENOUS | Status: AC
  Administered 2017-12-21: 1000 mL via INTRAVENOUS

## 2017-12-21 MED ORDER — IOPAMIDOL (ISOVUE-370) INJECTION 76%
100.0000 mL | Freq: Once | INTRAVENOUS | Status: AC | PRN
  Administered 2017-12-21: 100 mL via INTRAVENOUS

## 2017-12-21 NOTE — ED Notes (Signed)
Patient transported to CT 

## 2017-12-21 NOTE — ED Notes (Signed)
Numbness on Left side mostly chest side and legs. Feels less touch on screening

## 2017-12-21 NOTE — ED Provider Notes (Addendum)
[facility]  I accepted care from Dr. Beather Arbour ____________________________________________    LABS (pertinent positives/negatives)  I, Lisa Roca, MD have personally reviewed the lab reports noted below.  Labs Reviewed  COMPREHENSIVE METABOLIC PANEL - Abnormal; Notable for the following components:      Result Value   Glucose, Bld 104 (*)    ALT 13 (*)    Alkaline Phosphatase 27 (*)    All other components within normal limits  URINALYSIS, COMPLETE (UACMP) WITH MICROSCOPIC - Abnormal; Notable for the following components:   Color, Urine YELLOW (*)    APPearance HAZY (*)    Specific Gravity, Urine 1.004 (*)    Hgb urine dipstick LARGE (*)    Bacteria, UA FEW (*)    Squamous Epithelial / LPF 0-5 (*)    All other components within normal limits  CBC WITH DIFFERENTIAL/PLATELET  TROPONIN I  POC URINE PREG, ED  POCT PREGNANCY, URINE     ____________________________________________    RADIOLOGY All xrays were viewed by me. Imaging interpreted by radiologist.  I, Lisa Roca MD have personally reviewed the imaging report noted below.  MRI brain radiologist report:  IMPRESSION: 1. Normal for age MRI appearance of the brain. 2. Trace paranasal sinus and mastoid mucosal thickening/fluid - appears inconsequential.  CT angio abdomen pelvis radiology report:  IMPRESSION: No acute finding of the CT.  Patent left common iliac vein stent, with no evidence of DVT on the CT venogram.  Physiologic changes of the uterus and adnexa.  Similar appearance of the mild stranding of mesenteric fat with trace fluid within the peritoneal leaves. Again, in the absence of trauma, differential includes nonspecific inflammation or infection.  ____________________________________________   PROCEDURES  Procedure(s) performed: None  Critical Care performed: None  ____________________________________________   INITIAL IMPRESSION / ASSESSMENT AND PLAN / ED  COURSE   Pertinent labs & imaging results that were available during my care of the patient were reviewed by me and considered in my medical decision making (see chart for details).  I accepted care from Dr. Beather Arbour.  Reviewed MRI upon completion, no findings to explain numbness and tingling of the left leg, up into the left side abdomen.  I went back to take history myself, sound like patient woke up around 2 AM with numbness and tingling of her left thigh and leg and somewhat into her flank her midabdomen and then it sounds like possibly also her left arm.  Right now it is decreased, but she still feels like some paresthesia to her left leg and possibly up into her left abdomen.  She is a poor historian with respect to the issues with her clots.  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  During that point time she had extensive lower lower extremity DVT and was on Eliquis and aspirin.  It appears there is a note where September 2018 she discontinued the aspirin, and had finished Eliquis significantly before that.  Patient feels like her symptoms reminded her when she had the clotting issue.  There is no evidence on exam for arterial occlusion.  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  He was not recommending based on just those findings any initiation of DVT in a correlation.  However he was recommended baby aspirin.  He did recommend CT for investigation of the iliac for thrombosis, given ultrasound was non-diagnostic for that.  From the other standpoint of  neurologic complaints of paresthesia, that head CT and MRI are reassuring.  CT angios abdomen and pelvis is reassuring for no acute finding, and patent left iliac stent.  Patient will be discharged home for follow-up with her vascular surgeon.  Based on his her condition, will place her on aspirin 81 mg again.     CONSULTATIONS: Dr. Chipper Herb, Vascular Surgery [facility] -recommends CT for evaluation of iliac stent.  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  He did recommend that the patient should be on baby aspirin.  In the event of iliac stent thrombosis, he would need to be called back.  In the event of no evidence of stent thrombosis at the level of the iliac, would not recommend Eliquis.    Patient / Family / Caregiver informed of clinical course, medical decision-making process, and agree with plan.   I discussed return precautions, follow-up instructions, and discharged instructions with patient and/or family.   Discharge instructions:  You are evaluated for left leg and left side numbness/tingling also called paresthesia.  Although no certain cause was found, your exam and evaluation are overall reassuring in the emerge department today.  I am asking that you follow-up with your primary care physician, a neurologist, and your vascular surgeon.  Please start back 81 mg aspirin daily.  Return to emergency department immediately for any worsening condition including any weakness, vision changes, slurred speech, problems finding words, headache, confusion or altered mental status, dizziness or passing out, abdominal or leg pain, or any other symptoms concerning to you.   ____________________________________________   FINAL CLINICAL IMPRESSION(S) / ED DIAGNOSES  Final diagnoses:  Paresthesia        Lisa Roca,  MD 12/21/17 1447    Lisa Roca, MD 12/21/17 [phone]

## 2017-12-21 NOTE — Telephone Encounter (Signed)
Patient needs for you to call her. She went to the ER for numbness of the left side and they want to put her on aspirin. She wants to know if this will affect her procedure. She has a couple of other questions to ask also. Please call

## 2017-12-21 NOTE — ED Triage Notes (Signed)
Pt woke up at 0200 with tingling to left arm and leg, states when she went to bed at 2330 she felt normal. States last time she had similar symptoms she had dvt and pe.

## 2017-12-21 NOTE — ED Provider Notes (Signed)
[facility] Emergency Department Provider Note   ____________________________________________   First MD Initiated Contact with Patient 12/21/17 [phone]     (approximate)  I have reviewed the triage vital signs and the nursing notes.   HISTORY  Chief Complaint Numbness    HPI Ana Knapp is a 37 y.o. female who presents to the ED from home with a chief complaint of numbness/tingling.  Patient reports she went to bed in her usual state of health at 11:30 PM and woke up at 2 AM with tingling sensation to her left torso into her left lower leg.  She has a history of May-Thurner syndrome, fibromyalgia, PE/DVT in 2017, status post left femoral angioplasty/stent in March 2017.  States the last time she felt like this she had a PE and DVT.  Denies slurred speech, extremity weakness, confusion.  Denies fever, chills, chest pain, shortness of breath, nausea, vomiting.  Endorses urinary frequency.  Over the past few weeks patient has had evaluation for lower abdominal pain and epigastric pain.  Recent abdominal CT unremarkable except for mild nonspecific fluid.  She is scheduled for EGD/colonoscopy by her GI doctor next week.  Denies hormone use, recent travel or trauma.  Denies use of anticoagulants.   Past Medical History:  Diagnosis Date  . Chronic sinusitis   . Fibromyalgia   . GERD (gastroesophageal reflux disease)   . Headache    sinus  . History of melanoma 11/22/2017  . History of melanoma 2018   Back  . May-Thurner syndrome 11/30/2015   January 2017; 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[facility]   . PONV (postoperative nausea and vomiting)   . Pulmonary embolism [facility])     Patient Active Problem List   Diagnosis Date Noted  . Nausea 11/22/2017  . PONV (postoperative nausea and vomiting) 11/22/2017  . History of melanoma 11/22/2017  . Biliary dyskinesia 12/05/2016  . Constipation 08/18/2016  . Pre-conception counseling 08/18/2016  . Vitamin B12 deficiency 08/18/2016    . Vitamin D deficiency 08/18/2016  . Shortness of breath 06/19/2016  . Abdominal pain 06/15/2016  . Microcytic hypochromic anemia 06/15/2016  . Abnormal weight gain 06/15/2016  . Fatigue 06/15/2016  . Hyperphosphatemia 11/30/2015  . May-Thurner syndrome 11/30/2015  . DVT (deep venous thrombosis) ([place]) 11/13/2015  . History of pulmonary embolism 11/13/2015  . Asthma exacerbation attacks 11/04/2015  . PE (pulmonary thromboembolism) ([place]) 10/25/2015  . Sinus problem 10/13/2015  . Abdominal pain, chronic, epigastric 09/28/2015  . GERD (gastroesophageal reflux disease) 09/28/2015  . Flushing 09/24/2015  . Fibromyalgia 08/30/2015  . Seasonal allergic rhinitis 08/30/2015  . Alopecia 08/30/2015    Past Surgical History:  Procedure Laterality Date  . ANGIOPLASTY / STENTING FEMORAL  March 2017  . CHOLECYSTECTOMY    . Lytics Catheter Placement  11/16/15  . MELANOMA EXCISION  2018   Back  . ROBOTIC ASSISTED LAPAROSCOPIC CHOLECYSTECTOMY-SINGLE SITE  12/29/2016    Prior to Admission medications   Medication Sig Start Date End Date Taking? Authorizing Provider  Cholecalciferol (VITAMIN D PO) Take 1,000 Units by mouth daily.    Yes [provider]  Dexlansoprazole (DEXILANT PO) Take 60 mg by mouth at bedtime.    Yes [provider]  loratadine (CLARITIN) 10 MG tablet Take 10 mg by mouth daily.   Yes [provider]  montelukast (SINGULAIR) 10 MG tablet Take 1 tablet (10 mg total) by mouth at bedtime. 12/19/17  Yes Lada, Satira Anis, MD  Multiple Vitamin (MULTIVITAMIN) tablet Take 1 tablet by  mouth daily.   Yes [provider]  polyethylene glycol (MIRALAX / GLYCOLAX) packet Take 17 g by mouth daily as needed.   Yes [provider]  PREVIDENT 5000 ENAMEL PROTECT 1.1-5 % PSTE  11/02/17  Yes [provider]  sucralfate (CARAFATE) 1 g tablet Take 1 tablet (1 g total) by mouth 4 (four) times daily -  with meals and at bedtime. 12/11/17  Yes Lada,  Satira Anis, MD  acetaminophen (TYLENOL) 325 MG tablet Take 650 mg by mouth every 6 (six) hours as needed.    [provider]  ondansetron (ZOFRAN ODT) 4 MG disintegrating tablet Take 1 tablet (4 mg total) by mouth every 8 (eight) hours as needed for nausea or vomiting. Patient not taking: Reported on 12/21/2017 12/09/17   Gregor Hams, MD  pantoprazole (PROTONIX) 40 MG tablet Take 1 tablet (40 mg total) by mouth 2 (two) times daily before a meal. Patient not taking: Reported on 12/20/2017 11/22/17   Arnetha Courser, MD  traMADol (ULTRAM) 50 MG tablet Take 1 tablet (50 mg total) by mouth every 6 (six) hours as needed. Patient not taking: Reported on 12/20/2017 12/09/17 12/09/18  Gregor Hams, MD    Allergies Ortho tri-cyclen [norgestimate-eth estradiol]; Prednisone; and Augmentin [amoxicillin-pot clavulanate]  Family History  Problem Relation Age of Onset  . Thyroid disease Mother   . Diabetes Mother   . Cancer Father        bladder  . Hypertension Maternal Grandmother   . Thyroid disease Paternal Grandmother   . Diabetes Paternal Grandmother   . Stroke Paternal Grandmother   . Cancer Paternal Grandfather        skin  . Heart disease Neg Hx   . COPD Neg Hx     Social History Social History   Tobacco Use  . Smoking status: Never Smoker  . Smokeless tobacco: Never Used  Substance Use Topics  . Alcohol use: Yes    Comment: rare  . Drug use: No    Review of Systems  Constitutional: No fever/chills. Eyes: No visual changes. ENT: No sore throat. Cardiovascular: Denies chest pain. Respiratory: Denies shortness of breath. Gastrointestinal: No abdominal pain.  No nausea, no vomiting.  No diarrhea.  No constipation. Genitourinary: Negative for dysuria. Musculoskeletal: Negative for back pain. Skin: Negative for rash. Neurological: Positive for numbness/tingling.  Negative for headaches, focal weakness.   ____________________________________________   PHYSICAL  EXAM:  VITAL SIGNS: ED Triage Vitals  Enc Vitals Group     BP 12/21/17 0248 116/72     Pulse Rate 12/21/17 0248 70     Resp 12/21/17 0248 20     Temp 12/21/17 0248 98 F (36.7 C)     Temp Source 12/21/17 0248 Oral     SpO2 12/21/17 0248 100 %     Weight 12/21/17 0249 157 lb (71.2 kg)     Height 12/21/17 0249 5\' 9"  (1.753 m)     Head Circumference --      Peak Flow --      Pain Score 12/21/17 0249 4     Pain Loc --      Pain Edu? --      Excl. in [place]? --     Constitutional: Alert and oriented. Well appearing and in no acute distress. Eyes: Conjunctivae are normal. PERRL. EOMI. Head: Atraumatic. Nose: No congestion/rhinnorhea. Mouth/Throat: Mucous membranes are moist.  Oropharynx non-erythematous. Neck: No stridor.  No carotid bruits.  Supple neck without meningismus. Cardiovascular: Normal  rate, regular rhythm. Grossly normal heart sounds.  Good peripheral circulation. Respiratory: Normal respiratory effort.  No retractions. Lungs CTAB. Gastrointestinal: Soft and nontender. No distention. No abdominal bruits. No CVA tenderness. Musculoskeletal: No lower extremity tenderness nor edema.  No joint effusions. Neurologic: Alert and oriented x3.  CN II-XII grossly intact.  Normal speech and language. No gross focal neurologic deficits are appreciated.  Motor strength intact all extremities.  Mild subjective decreased sensation left arm and lower extremity. Skin:  Skin is warm, dry and intact. No rash noted. Psychiatric: Mood and affect are normal. Speech and behavior are normal.  ____________________________________________   LABS (all labs ordered are listed, but only abnormal results are displayed)  Labs Reviewed  COMPREHENSIVE METABOLIC PANEL - Abnormal; Notable for the following components:      Result Value   Glucose, Bld 104 (*)    ALT 13 (*)    Alkaline Phosphatase 27 (*)    All other components within normal limits  URINALYSIS, COMPLETE (UACMP) WITH MICROSCOPIC -  Abnormal; Notable for the following components:   Color, Urine YELLOW (*)    APPearance HAZY (*)    Specific Gravity, Urine 1.004 (*)    Hgb urine dipstick LARGE (*)    Bacteria, UA FEW (*)    Squamous Epithelial / LPF 0-5 (*)    All other components within normal limits  CBC WITH DIFFERENTIAL/PLATELET  TROPONIN I  POC URINE PREG, ED  POCT PREGNANCY, URINE   ____________________________________________  EKG  ED ECG REPORT I, SUNG,JADE J, the attending physician, personally viewed and interpreted this ECG.   Date: 12/21/2017  EKG Time: 0446  Rate: 75  Rhythm: normal EKG, normal sinus rhythm  Axis: Normal  Intervals:none  ST&T Change: Nonspecific  ____________________________________________  RADIOLOGY  ED MD interpretation: No acute CT findings  Official radiology report(s): Ct Head Wo Contrast  Result Date: 12/21/2017 CLINICAL DATA:  37 y/o  F; tingling in left arm and leg. EXAM: CT HEAD WITHOUT CONTRAST TECHNIQUE: Contiguous axial images were obtained from the base of the skull through the vertex without intravenous contrast. COMPARISON:  None. FINDINGS: Brain: No evidence of acute infarction, hemorrhage, hydrocephalus, extra-axial collection or mass lesion/mass effect. Vascular: No hyperdense vessel or unexpected calcification. Skull: Normal. Negative for fracture or focal lesion. Sinuses/Orbits: No acute finding. Other: None. IMPRESSION: Negative CT of the head. Electronically Signed   By: Kristine Garbe M.D.   On: 12/21/2017 04:37   Ct Angio Chest Pe W/cm &/or Wo Cm  Result Date: 12/21/2017 CLINICAL DATA:  37 y/o F; tingling in the left arm and leg. History of DVT and PE. EXAM: CT ANGIOGRAPHY CHEST WITH CONTRAST TECHNIQUE: Multidetector CT imaging of the chest was performed using the standard protocol during bolus administration of intravenous contrast. Multiplanar CT image reconstructions and MIPs were obtained to evaluate the vascular anatomy. CONTRAST:  69mL  ISOVUE-370 IOPAMIDOL (ISOVUE-370) INJECTION 76% COMPARISON:  11/20/2015 CT angiogram of the chest. FINDINGS: Cardiovascular: Satisfactory opacification of the pulmonary arteries to the segmental level. No evidence of pulmonary embolism. Normal heart size. No pericardial effusion. Mediastinum/Nodes: No enlarged mediastinal, hilar, or axillary lymph nodes. Thyroid gland, trachea, and esophagus demonstrate no significant findings. Lungs/Pleura: Streaky opacities within the right greater than left lung bases likely representing scarring from prior pneumonia. No consolidation, effusion, or pneumothorax. Upper Abdomen: No acute abnormality. Musculoskeletal: No chest wall abnormality. No acute or significant osseous findings. Review of the MIP images confirms the above findings. IMPRESSION: 1. No pulmonary embolus identified. 2.  Mild scarring in the lung bases. No acute pulmonary process identified. Electronically Signed   By: Kristine Garbe M.D.   On: 12/21/2017 04:47   US Venous Img Lower Unilateral Left  Result Date: 12/21/2017 CLINICAL DATA:  Left calf pain and left leg numbness. EXAM: LEFT LOWER EXTREMITY VENOUS DOPPLER ULTRASOUND TECHNIQUE: Gray-scale sonography with graded compression, as well as color Doppler and duplex ultrasound were performed to evaluate the lower extremity deep venous systems from the level of the common femoral vein and including the common femoral, femoral, profunda femoral, popliteal and calf veins including the posterior tibial, peroneal and gastrocnemius veins when visible. The superficial great saphenous vein was also interrogated. Spectral Doppler was utilized to evaluate flow at rest and with distal augmentation maneuvers in the common femoral, femoral and popliteal veins. COMPARISON:  Left lower extremity deep venous duplex Doppler 11/12/2015 FINDINGS: Contralateral Common Femoral Vein: Respiratory phasicity is normal and symmetric with the symptomatic side. No evidence  of thrombus. Normal compressibility. Common Femoral Vein: No evidence of thrombus. Normal compressibility, respiratory phasicity and response to augmentation. Saphenofemoral Junction: No evidence of thrombus. Normal compressibility and flow on color Doppler imaging. Profunda Femoral Vein: No evidence of thrombus. Normal compressibility and flow on color Doppler imaging. Femoral Vein: No evidence of thrombus. Normal compressibility, respiratory phasicity and response to augmentation. Popliteal Vein: Nonocclusive thrombus noted in the left popliteal vein. This could represent residual thrombus from 11/12/2015. Superimposed acute thrombus cannot be excluded. Calf Veins: No evidence of thrombus. Normal compressibility and flow on color Doppler imaging. Superficial Great Saphenous Vein: No evidence of thrombus. Normal compressibility. Other Findings:  None. IMPRESSION: Nonocclusive left popliteal vein thrombus is noted. This could represent residual chronic thrombus from 11/12/2015. Superimposed acute thrombus cannot be excluded. No other focal abnormalities identified. Electronically Signed   By: Marcello Moores  Register   On: 12/21/2017 06:55    ____________________________________________   PROCEDURES  Procedure(s) performed: None  Procedures  Critical Care performed:   CRITICAL CARE Performed by: Paulette Blanch   Total critical care time: 45 minutes  Critical care time was exclusive of separately billable procedures and treating other patients.  Critical care was necessary to treat or prevent imminent or life-threatening deterioration.  Critical care was time spent personally by me on the following activities: development of treatment plan with patient and/or surrogate as well as nursing, discussions with consultants, evaluation of patient's response to treatment, examination of patient, obtaining history from patient or surrogate, ordering and performing treatments and interventions, ordering and review  of laboratory studies, ordering and review of radiographic studies, pulse oximetry and re-evaluation of patient's condition.  ____________________________________________   INITIAL IMPRESSION / ASSESSMENT AND PLAN / ED COURSE  As part of my medical decision making, I reviewed the following data within the [place] History obtained from family, Nursing notes reviewed and incorporated, Labs reviewed, EKG interpreted, Old chart reviewed, Radiograph reviewed and Notes from prior ED visits.   37 year old female with May-Thurner syndrome status post left femoral stent, history of PE/DVT, who presents with numbness/tingling reminiscent of her symptoms when she was diagnosed with blood clots. Differential diagnosis includes, but is not limited to, intracranial hemorrhage, meningitis/encephalitis, previous head trauma, cavernous venous thrombosis, tension headache, temporal arteritis, migraine or migraine equivalent, idiopathic intracranial hypertension, and non-specific headache, viral syndrome, bronchitis including COPD exacerbation, pneumonia, reactive airway disease including asthma, CHF including exacerbation with or without pulmonary/interstitial edema, pneumothorax, ACS, thoracic trauma, and pulmonary embolism.  Will obtain screening lab work and urinalysis.  Given patient's complex medical history and prior PE/DVT, will proceed with CT chest.   Clinical Course as of Dec 21 712  Thu Dec 21, 2017  0500 Updated patient of negative CT scans.  Awaiting Doppler ultrasound.  [JS]  0700 Awaiting [place] results. Cannot explain patient's symptoms of numbness/tingling of her left upper and lower extremities. Given her complex medical history and prior history of clots, will obtain MRI Brain to evaluate CVA/TIA. Consider neurology consultation after MRI. Care transferred to Dr. Reita Cliche.  [JS]    Clinical Course User Index [JS] Paulette Blanch, MD      ____________________________________________   FINAL CLINICAL IMPRESSION(S) / ED DIAGNOSES  Final diagnoses:  Paresthesia  Left sided numbness     ED Discharge Orders    None       Note:  This document was prepared using Dragon voice recognition software and may include unintentional dictation errors.    Paulette Blanch, MD 12/21/17 [phone]

## 2017-12-21 NOTE — Discharge Instructions (Signed)
You are evaluated for left leg and left side numbness/tingling also called paresthesia.  Although no certain cause was found, your exam and evaluation are overall reassuring in the emerge department today.  I am asking that you follow-up with your primary care physician, a neurologist, and your vascular surgeon.  Please start back 81 mg aspirin daily.  Return to emergency department immediately for any worsening condition including any weakness, vision changes, slurred speech, problems finding words, headache, confusion or altered mental status, dizziness or passing out, abdominal or leg pain, or any other symptoms concerning to you.

## 2017-12-22 ENCOUNTER — Telehealth: Payer: Self-pay | Admitting: Gastroenterology

## 2017-12-22 NOTE — Telephone Encounter (Signed)
Spoke with pt regarding her recent ER visit. Pt stated they thought she had another blood clot or seizure but she did not. Pt is having a lot of stomach issues and is really wanting to move forward with her colonoscopy. She is taking any blood thinners like Plavix etc. But is taking ASA 81mg . Please let me know if its okay to keep procedure for Thursday, March 7th.

## 2017-12-22 NOTE — Telephone Encounter (Signed)
Pt is calling for Ginger she states she was in the ER had MRI done they thought she had another bloodclot and or seizure  She would like a call regarding up coming procedure   cb # [id number]

## 2017-12-25 ENCOUNTER — Other Ambulatory Visit: Payer: Self-pay

## 2017-12-25 DIAGNOSIS — R9389 Abnormal findings on diagnostic imaging of other specified body structures: Secondary | ICD-10-CM

## 2017-12-25 NOTE — Telephone Encounter (Signed)
Let the patient know that the problem is that anesthesia is very hesitant to put her to sleep with a recent blood clot fearing that it can cause a pulmonary embolus.

## 2017-12-26 ENCOUNTER — Encounter: Payer: Self-pay | Admitting: Family Medicine

## 2017-12-26 MED ORDER — SUCRALFATE 1 G PO TABS: 1.0000 g | ORAL_TABLET | Freq: Three times a day (TID) | ORAL | 0 refills | Status: DC

## 2017-12-26 NOTE — Telephone Encounter (Signed)
Pt notified of sedation issue. Pt has been rescheduled to [facility]. Per pt, a new clot was not discovered during her ER visit. No blood thinners or ASA 81MG .

## 2017-12-26 NOTE — Telephone Encounter (Signed)
I called Dr. Chipper Herb, vascular Dr. Allen Norris is her GI doctor and he is doing her procedure on 01/02/18 instead of 12/28/17

## 2017-12-28 ENCOUNTER — Ambulatory Visit: Admit: 2017-12-28 | Payer: BLUE CROSS/BLUE SHIELD | Admitting: Gastroenterology

## 2017-12-28 HISTORY — DX: Gastro-esophageal reflux disease without esophagitis: K21.9

## 2017-12-28 HISTORY — DX: Headache: R51

## 2017-12-28 HISTORY — DX: Headache, unspecified: R51.9

## 2017-12-28 HISTORY — DX: Nausea with vomiting, unspecified: R11.2

## 2017-12-28 HISTORY — DX: Other specified postprocedural states: Z98.890

## 2017-12-28 SURGERY — COLONOSCOPY WITH PROPOFOL
Anesthesia: Choice

## 2018-01-02 ENCOUNTER — Encounter
Admission: RE | Disposition: A | Discharge status: Home / Self Care | Payer: Self-pay | Source: Ambulatory Visit | Attending: Gastroenterology

## 2018-01-02 ENCOUNTER — Encounter: Payer: Self-pay | Admitting: Anesthesiology

## 2018-01-02 ENCOUNTER — Encounter: Payer: Self-pay | Admitting: Family Medicine

## 2018-01-02 ENCOUNTER — Ambulatory Visit
Admission: RE | Admit: 2018-01-02 | Discharge: 2018-01-02 | Disposition: A | Discharge status: Home / Self Care | Payer: BLUE CROSS/BLUE SHIELD | Source: Ambulatory Visit | Attending: Gastroenterology | Admitting: Gastroenterology

## 2018-01-02 ENCOUNTER — Ambulatory Visit: Payer: BLUE CROSS/BLUE SHIELD | Admitting: Certified Registered"

## 2018-01-02 DIAGNOSIS — Z86711 Personal history of pulmonary embolism: Secondary | ICD-10-CM | POA: Diagnosis not present

## 2018-01-02 DIAGNOSIS — K639 Disease of intestine, unspecified: Secondary | ICD-10-CM | POA: Diagnosis not present

## 2018-01-02 DIAGNOSIS — J329 Chronic sinusitis, unspecified: Secondary | ICD-10-CM | POA: Insufficient documentation

## 2018-01-02 DIAGNOSIS — Z8052 Family history of malignant neoplasm of bladder: Secondary | ICD-10-CM | POA: Diagnosis not present

## 2018-01-02 DIAGNOSIS — Z809 Family history of malignant neoplasm, unspecified: Secondary | ICD-10-CM | POA: Diagnosis not present

## 2018-01-02 DIAGNOSIS — Z85820 Personal history of malignant melanoma of skin: Secondary | ICD-10-CM | POA: Insufficient documentation

## 2018-01-02 DIAGNOSIS — K6389 Other specified diseases of intestine: Secondary | ICD-10-CM | POA: Diagnosis not present

## 2018-01-02 DIAGNOSIS — K5289 Other specified noninfective gastroenteritis and colitis: Secondary | ICD-10-CM | POA: Diagnosis not present

## 2018-01-02 DIAGNOSIS — Z95820 Peripheral vascular angioplasty status with implants and grafts: Secondary | ICD-10-CM | POA: Insufficient documentation

## 2018-01-02 DIAGNOSIS — K2950 Unspecified chronic gastritis without bleeding: Secondary | ICD-10-CM | POA: Diagnosis not present

## 2018-01-02 DIAGNOSIS — M797 Fibromyalgia: Secondary | ICD-10-CM | POA: Insufficient documentation

## 2018-01-02 DIAGNOSIS — Z79899 Other long term (current) drug therapy: Secondary | ICD-10-CM | POA: Diagnosis not present

## 2018-01-02 DIAGNOSIS — K219 Gastro-esophageal reflux disease without esophagitis: Secondary | ICD-10-CM

## 2018-01-02 DIAGNOSIS — Z823 Family history of stroke: Secondary | ICD-10-CM | POA: Insufficient documentation

## 2018-01-02 DIAGNOSIS — Z8349 Family history of other endocrine, nutritional and metabolic diseases: Secondary | ICD-10-CM | POA: Diagnosis not present

## 2018-01-02 DIAGNOSIS — Z9049 Acquired absence of other specified parts of digestive tract: Secondary | ICD-10-CM | POA: Insufficient documentation

## 2018-01-02 DIAGNOSIS — R9389 Abnormal findings on diagnostic imaging of other specified body structures: Secondary | ICD-10-CM | POA: Diagnosis not present

## 2018-01-02 DIAGNOSIS — Z833 Family history of diabetes mellitus: Secondary | ICD-10-CM | POA: Diagnosis not present

## 2018-01-02 DIAGNOSIS — R933 Abnormal findings on diagnostic imaging of other parts of digestive tract: Secondary | ICD-10-CM | POA: Diagnosis present

## 2018-01-02 DIAGNOSIS — K529 Noninfective gastroenteritis and colitis, unspecified: Secondary | ICD-10-CM | POA: Insufficient documentation

## 2018-01-02 DIAGNOSIS — K2960 Other gastritis without bleeding: Secondary | ICD-10-CM | POA: Diagnosis not present

## 2018-01-02 HISTORY — PX: COLONOSCOPY WITH PROPOFOL: SHX5780

## 2018-01-02 HISTORY — PX: ESOPHAGOGASTRODUODENOSCOPY (EGD) WITH PROPOFOL: SHX5813

## 2018-01-02 LAB — POCT PREGNANCY, URINE: PREG TEST UR: NEGATIVE

## 2018-01-02 SURGERY — COLONOSCOPY WITH PROPOFOL
Anesthesia: General

## 2018-01-02 MED ORDER — PROPOFOL 10 MG/ML IV BOLUS
INTRAVENOUS | Status: DC | PRN
Start: 2018-01-02 — End: 2018-01-02
  Administered 2018-01-02: 100 mg via INTRAVENOUS

## 2018-01-02 MED ORDER — PROPOFOL 10 MG/ML IV BOLUS
INTRAVENOUS | Status: AC
  Filled 2018-01-02: qty 20

## 2018-01-02 MED ORDER — LIDOCAINE HCL (PF) 1 % IJ SOLN
INTRAMUSCULAR | Status: AC
  Administered 2018-01-02: 0.3 mL via INTRADERMAL
  Filled 2018-01-02: qty 2

## 2018-01-02 MED ORDER — MIDAZOLAM HCL 2 MG/2ML IJ SOLN
INTRAMUSCULAR | Status: AC
  Filled 2018-01-02: qty 2

## 2018-01-02 MED ORDER — LIDOCAINE HCL (CARDIAC) 20 MG/ML IV SOLN
INTRAVENOUS | Status: DC | PRN
  Administered 2018-01-02: 50 mg via INTRAVENOUS

## 2018-01-02 MED ORDER — PHENYLEPHRINE HCL 10 MG/ML IJ SOLN
INTRAMUSCULAR | Status: DC | PRN
  Administered 2018-01-02 (×2): 100 ug via INTRAVENOUS

## 2018-01-02 MED ORDER — SODIUM CHLORIDE 0.9 % IV SOLN
INTRAVENOUS | Status: DC
  Administered 2018-01-02: 09:00:00 via INTRAVENOUS

## 2018-01-02 MED ORDER — LIDOCAINE HCL (PF) 1 % IJ SOLN
2.0000 mL | Freq: Once | INTRAMUSCULAR | Status: AC
  Administered 2018-01-02: 0.3 mL via INTRADERMAL

## 2018-01-02 MED ORDER — MIDAZOLAM HCL 5 MG/5ML IJ SOLN
INTRAMUSCULAR | Status: DC | PRN
Start: 2018-01-02 — End: 2018-01-02
  Administered 2018-01-02: 2 mg via INTRAVENOUS

## 2018-01-02 MED ORDER — LIDOCAINE HCL (PF) 2 % IJ SOLN
INTRAMUSCULAR | Status: AC
  Filled 2018-01-02: qty 10

## 2018-01-02 MED ORDER — PROPOFOL 500 MG/50ML IV EMUL
INTRAVENOUS | Status: AC
  Filled 2018-01-02: qty 50

## 2018-01-02 MED ORDER — PROPOFOL 500 MG/50ML IV EMUL
INTRAVENOUS | Status: DC | PRN
  Administered 2018-01-02: 125 ug/kg/min via INTRAVENOUS

## 2018-01-02 NOTE — Anesthesia Post-op Follow-up Note (Signed)
Anesthesia QCDR form completed.        

## 2018-01-02 NOTE — H&P (Signed)
Lucilla Lame, MD [address] [place], [place] [address] Phone:[phone] Fax : [phone]  Primary Care Physician:  Arnetha Courser, MD Primary Gastroenterologist:  Dr. Allen Norris  Pre-Procedure History & Physical: HPI:  Ana Knapp is a 37 y.o. female is here for an endoscopy and colonoscopy.   Past Medical History:  Diagnosis Date  . Chronic sinusitis   . Fibromyalgia   . GERD (gastroesophageal reflux disease)   . Headache    sinus  . History of melanoma 11/22/2017  . History of melanoma 2018   Back  . May-Thurner syndrome 11/30/2015   January 2017; 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[facility]   . PONV (postoperative nausea and vomiting)   . Pulmonary embolism [facility])     Past Surgical History:  Procedure Laterality Date  . ANGIOPLASTY / STENTING FEMORAL  March 2017  . CHOLECYSTECTOMY    . Lytics Catheter Placement  11/16/15  . MELANOMA EXCISION  2018   Back  . ROBOTIC ASSISTED LAPAROSCOPIC CHOLECYSTECTOMY-SINGLE SITE  12/29/2016    Prior to Admission medications   Medication Sig Start Date End Date Taking? Authorizing Provider  acetaminophen (TYLENOL) 325 MG tablet Take 650 mg by mouth every 6 (six) hours as needed.    [provider]  Cholecalciferol (VITAMIN D PO) Take 1,000 Units by mouth daily.     [provider]  Dexlansoprazole (DEXILANT PO) Take 60 mg by mouth at bedtime.     [provider]  loratadine (CLARITIN) 10 MG tablet Take 10 mg by mouth daily.    [provider]  montelukast (SINGULAIR) 10 MG tablet Take 1 tablet (10 mg total) by mouth at bedtime. 12/19/17   Arnetha Courser, MD  Multiple Vitamin (MULTIVITAMIN) tablet Take 1 tablet by mouth daily.    [provider]  ondansetron (ZOFRAN ODT) 4 MG disintegrating tablet Take 1 tablet (4 mg total) by mouth every 8 (eight) hours as needed for nausea or vomiting. Patient not taking: Reported on 12/21/2017 12/09/17   Gregor Hams, MD  pantoprazole (PROTONIX) 40  MG tablet Take 1 tablet (40 mg total) by mouth 2 (two) times daily before a meal. Patient not taking: Reported on 12/20/2017 11/22/17   Arnetha Courser, MD  polyethylene glycol (MIRALAX / GLYCOLAX) packet Take 17 g by mouth daily as needed.    [provider]  PREVIDENT 5000 ENAMEL PROTECT 1.1-5 % PSTE  11/02/17   [provider]  sucralfate (CARAFATE) 1 g tablet Take 1 tablet (1 g total) by mouth 4 (four) times daily -  with meals and at bedtime. 12/26/17   Lucilla Lame, MD  traMADol (ULTRAM) 50 MG tablet Take 1 tablet (50 mg total) by mouth every 6 (six) hours as needed. Patient not taking: Reported on 12/20/2017 12/09/17 12/09/18  Gregor Hams, MD    Allergies as of 12/26/2017 - Review Complete 12/21/2017  Allergen Reaction Noted  . Ortho tri-cyclen [norgestimate-eth estradiol] Other (See Comments) 11/30/2015  . Prednisone Anaphylaxis 11/12/2015  . Augmentin [amoxicillin-pot clavulanate] Other (See Comments) 08/26/2015    Family History  Problem Relation Age of Onset  . Thyroid disease Mother   . Diabetes Mother   . Cancer Father        bladder  . Hypertension Maternal Grandmother   . Thyroid disease Paternal Grandmother   . Diabetes Paternal Grandmother   . Stroke Paternal Grandmother   . Cancer Paternal Grandfather        skin  . Heart disease  Neg Hx   . COPD Neg Hx     Social History   Socioeconomic History  . Marital status: Married    Spouse name: Not on file  . Number of children: Not on file  . Years of education: Not on file  . Highest education level: Not on file  Social Needs  . Financial resource strain: Not on file  . Food insecurity - worry: Not on file  . Food insecurity - inability: Not on file  . Transportation needs - medical: Not on file  . Transportation needs - non-medical: Not on file  Occupational History  . Not on file  Tobacco Use  . Smoking status: Never Smoker  . Smokeless tobacco: Never Used  Substance and Sexual Activity   . Alcohol use: Yes    Comment: rare  . Drug use: No  . Sexual activity: Yes    Birth control/protection: None  Other Topics Concern  . Not on file  Social History Narrative  . Not on file    Review of Systems: See HPI, otherwise negative ROS  Physical Exam: BP (!) 116/47   Pulse 87   Temp 97.7 F (36.5 C) (Tympanic)   Resp 17   Ht 5\' 9"  (1.753 m)   Wt 156 lb (70.8 kg)   LMP 12/21/2017 Comment: neg preg test  SpO2 100%   BMI 23.04 kg/m  General:   Alert,  pleasant and cooperative in NAD Head:  Normocephalic and atraumatic. Neck:  Supple; no masses or thyromegaly. Lungs:  Clear throughout to auscultation.    Heart:  Regular rate and rhythm. Abdomen:  Soft, nontender and nondistended. Normal bowel sounds, without guarding, and without rebound.   Neurologic:  Alert and  oriented x4;  grossly normal neurologically.  Impression/Plan: Ana Knapp is here for an endoscopy and colonoscopy to be performed for abnormal CT scan of colon and epigastric pain  Risks, benefits, limitations, and alternatives regarding  endoscopy and colonoscopy have been reviewed with the patient.  Questions have been answered.  All parties agreeable.   Lucilla Lame, MD  01/02/2018, 9:36 AM

## 2018-01-02 NOTE — Op Note (Signed)
[facility] Gastroenterology Patient Name: Ana Knapp Procedure Date: 01/02/2018 9:48 AM MRN: [id number] Account #: [id number] Date of Birth: March 31, 1981 Admit Type: Outpatient Age: 37 Room: [facility] ENDO ROOM 4 Gender: Female Note Status: Finalized Procedure:            Colonoscopy Indications:          Abnormal CT of the GI tract Providers:            Lucilla Lame MD, MD Referring MD:         Arnetha Courser (Referring MD) Medicines:            Propofol per Anesthesia Complications:        No immediate complications. Procedure:            Pre-Anesthesia Assessment:                       - Prior to the procedure, a History and Physical was                        performed, and patient medications and allergies were                        reviewed. The patient's tolerance of previous                        anesthesia was also reviewed. The risks and benefits of                        the procedure and the sedation options and risks were                        discussed with the patient. All questions were                        answered, and informed consent was obtained. Prior                        Anticoagulants: The patient has taken no previous                        anticoagulant or antiplatelet agents. ASA Grade                        Assessment: II - A patient with mild systemic disease.                        After reviewing the risks and benefits, the patient was                        deemed in satisfactory condition to undergo the                        procedure.                       After obtaining informed consent, the colonoscope was                        passed under direct vision. Throughout the procedure,  the patient's blood pressure, pulse, and oxygen                        saturations were monitored continuously. The                        Colonoscope was introduced through the anus and                        advanced to  the the terminal ileum. The colonoscopy was                        performed without difficulty. The patient tolerated the                        procedure well. The quality of the bowel preparation                        was excellent. Findings:      The perianal and digital rectal examinations were normal.      A few petechiae were found in the sigmoid colon and in the transverse       colon. Biopsies were taken with a cold forceps for histology.      The exam was otherwise without abnormality.      The terminal ileum appeared normal. Biopsies were taken with a cold       forceps for histology. Impression:           - Petechia(e) in the sigmoid colon and in the                        transverse colon. Biopsied.                       - The examination was otherwise normal. Recommendation:       - Discharge patient to home.                       - Resume previous diet.                       - Continue present medications.                       - Await pathology results. Procedure Code(s):    --- Professional ---                       [phone], Colonoscopy, flexible; with biopsy, single or                        multiple Diagnosis Code(s):    --- Professional ---                       R93.3, Abnormal findings on diagnostic imaging of other                        parts of digestive tract                       K63.89, Other specified diseases of intestine CPT copyright 2016 American Medical Association. All rights reserved. The codes documented in this  report are preliminary and upon coder review may  be revised to meet current compliance requirements. Lucilla Lame MD, MD 01/02/2018 10:18:04 AM This report has been signed electronically. Number of Addenda: 0 Note Initiated On: 01/02/2018 9:48 AM Scope Withdrawal Time: 0 hours 8 minutes 3 seconds  Total Procedure Duration: 0 hours 13 minutes 45 seconds       [facility]

## 2018-01-02 NOTE — Anesthesia Postprocedure Evaluation (Signed)
Anesthesia Post Note  Patient: Ana Knapp  Procedure(s) Performed: COLONOSCOPY WITH PROPOFOL (N/A ) ESOPHAGOGASTRODUODENOSCOPY (EGD) WITH PROPOFOL (N/A )  Patient location during evaluation: Endoscopy Anesthesia Type: General Level of consciousness: awake and alert, oriented and patient cooperative Pain management: pain level controlled Vital Signs Assessment: post-procedure vital signs reviewed and stable Respiratory status: spontaneous breathing and respiratory function stable Cardiovascular status: blood pressure returned to baseline and stable Postop Assessment: no headache, no backache, patient able to bend at knees, no apparent nausea or vomiting and adequate PO intake Anesthetic complications: no     Last Vitals:  Vitals:   01/02/18 1019 01/02/18 1020  BP:    Pulse:    Resp:  14  Temp: (!) 36.2 C   SpO2:  100%    Last Pain:  Vitals:   01/02/18 1019  TempSrc: Tympanic                 [name]

## 2018-01-02 NOTE — Anesthesia Preprocedure Evaluation (Signed)
Anesthesia Evaluation  Patient identified by MRN, date of birth, ID band Patient awake    Reviewed: Allergy & Precautions, H&P , NPO status , Patient's Chart, lab work & pertinent test results, reviewed documented beta blocker date and time   History of Anesthesia Complications (+) PONV and history of anesthetic complications  Airway Mallampati: II  TM Distance: >3 FB Neck ROM: full    Dental  (+) Dental Advidsory Given, Teeth Intact Permanent retainer on the bottom:   Pulmonary neg shortness of breath, asthma , neg sleep apnea, neg recent URI,           Cardiovascular Exercise Tolerance: Good (-) hypertension(-) angina+ Peripheral Vascular Disease  (-) CAD, (-) Past MI, (-) Cardiac Stents and (-) CABG (-) dysrhythmias (-) Valvular Problems/Murmurs     Neuro/Psych PSYCHIATRIC DISORDERS (fibromyalgia) negative neurological ROS     GI/Hepatic Neg liver ROS, GERD  ,  Endo/Other  negative endocrine ROS  Renal/GU negative Renal ROS  negative genitourinary   Musculoskeletal   Abdominal   Peds  Hematology negative hematology ROS (+)   Anesthesia Other Findings Past Medical History: No date: Chronic sinusitis No date: Fibromyalgia No date: GERD (gastroesophageal reflux disease) No date: Headache     Comment:  sinus 11/22/2017: History of melanoma 2018: History of melanoma     Comment:  Back 11/30/2015: May-Thurner syndrome     Comment:  January 2017; 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[facility]  No date: PONV (postoperative nausea and vomiting) No date: Pulmonary embolism (HCC)   Reproductive/Obstetrics negative OB ROS                             Anesthesia Physical Anesthesia Plan  ASA: II  Anesthesia Plan: General   Post-op Pain Management:    Induction: Intravenous  PONV Risk Score and Plan: 4 or greater and Propofol infusion  Airway Management Planned: Nasal Cannula  Additional Equipment:    Intra-op Plan:   Post-operative Plan:   Informed Consent: I have reviewed the patients History and Physical, chart, labs and discussed the procedure including the risks, benefits and alternatives for the proposed anesthesia with the patient or authorized representative who has indicated his/her understanding and acceptance.   Dental Advisory Given  Plan Discussed with: Anesthesiologist, CRNA and Surgeon  Anesthesia Plan Comments:         Anesthesia Quick Evaluation

## 2018-01-02 NOTE — Transfer of Care (Signed)
Immediate Anesthesia Transfer of Care Note  Patient: Ana Knapp  Procedure(s) Performed: COLONOSCOPY WITH PROPOFOL (N/A ) ESOPHAGOGASTRODUODENOSCOPY (EGD) WITH PROPOFOL (N/A )  Patient Location: PACU  Anesthesia Type:General  Level of Consciousness: awake, alert , oriented and patient cooperative  Airway & Oxygen Therapy: Patient Spontanous Breathing  Post-op Assessment: Report given to RN, Post -op Vital signs reviewed and stable and Patient moving all extremities X 4  Post vital signs: Reviewed and stable  Last Vitals:  Vitals:   01/02/18 0916 01/02/18 1019  BP: (!) 116/47   Pulse: 87   Resp: 17   Temp: 36.5 C (!) 36.2 C  SpO2: 100%     Last Pain:  Vitals:   01/02/18 1019  TempSrc: Tympanic         Complications: No apparent anesthesia complications

## 2018-01-02 NOTE — Op Note (Signed)
[facility] Gastroenterology Patient Name: Ana Knapp Procedure Date: 01/02/2018 9:49 AM MRN: [id number] Account #: [id number] Date of Birth: 1980/12/24 Admit Type: Outpatient Age: 37 Room: [facility] ENDO ROOM 4 Gender: Female Note Status: Finalized Procedure:            Upper GI endoscopy Indications:          Epigastric abdominal pain, Dyspepsia Providers:            Lucilla Lame MD, MD Referring MD:         Arnetha Courser (Referring MD) Medicines:            Propofol per Anesthesia Complications:        No immediate complications. Procedure:            Pre-Anesthesia Assessment:                       - Prior to the procedure, a History and Physical was                        performed, and patient medications and allergies were                        reviewed. The patient's tolerance of previous                        anesthesia was also reviewed. The risks and benefits of                        the procedure and the sedation options and risks were                        discussed with the patient. All questions were                        answered, and informed consent was obtained. Prior                        Anticoagulants: The patient has taken no previous                        anticoagulant or antiplatelet agents. ASA Grade                        Assessment: II - A patient with mild systemic disease.                        After reviewing the risks and benefits, the patient was                        deemed in satisfactory condition to undergo the                        procedure.                       After obtaining informed consent, the endoscope was                        passed under direct vision. Throughout the procedure,  the patient's blood pressure, pulse, and oxygen                        saturations were monitored continuously. The Endoscope                        was introduced through the mouth, and advanced to the                     second part of duodenum. The upper GI endoscopy was                        accomplished without difficulty. The patient tolerated                        the procedure well. Findings:      The examined esophagus was normal.      The entire examined stomach was normal. Biopsies were taken with a cold       forceps for histology.      The examined duodenum was normal. Impression:           - Normal esophagus.                       - Normal stomach. Biopsied.                       - Normal examined duodenum. Recommendation:       - Discharge patient to home.                       - Resume previous diet.                       - Continue present medications.                       - Await pathology results.                       - Perform a colonoscopy today. Procedure Code(s):    --- Professional ---                       [phone], Esophagogastroduodenoscopy, flexible, transoral;                        with biopsy, single or multiple Diagnosis Code(s):    --- Professional ---                       R10.13, Epigastric pain CPT copyright 2016 American Medical Association. All rights reserved. The codes documented in this report are preliminary and upon coder review may  be revised to meet current compliance requirements. Lucilla Lame MD, MD 01/02/2018 9:59:45 AM This report has been signed electronically. Number of Addenda: 0 Note Initiated On: 01/02/2018 9:49 AM      [facility]

## 2018-01-03 ENCOUNTER — Other Ambulatory Visit: Payer: Self-pay

## 2018-01-03 ENCOUNTER — Encounter: Payer: Self-pay | Admitting: Gastroenterology

## 2018-01-03 DIAGNOSIS — N809 Endometriosis, unspecified: Secondary | ICD-10-CM

## 2018-01-03 NOTE — Progress Notes (Signed)
Referral placed per mychart message.  

## 2018-01-04 LAB — SURGICAL PATHOLOGY

## 2018-01-05 ENCOUNTER — Other Ambulatory Visit: Payer: Self-pay

## 2018-01-05 MED ORDER — DEXLANSOPRAZOLE 60 MG PO CPDR: 60.0000 mg | DELAYED_RELEASE_CAPSULE | Freq: Every day | ORAL | 6 refills | Status: DC

## 2018-01-07 ENCOUNTER — Encounter: Payer: Self-pay | Admitting: Family Medicine

## 2018-01-08 ENCOUNTER — Encounter: Payer: Self-pay | Admitting: Family Medicine

## 2018-01-11 ENCOUNTER — Encounter: Payer: Self-pay | Admitting: Family Medicine

## 2018-01-15 ENCOUNTER — Encounter: Payer: Self-pay | Admitting: Family Medicine

## 2018-01-18 ENCOUNTER — Ambulatory Visit (INDEPENDENT_AMBULATORY_CARE_PROVIDER_SITE_OTHER): Payer: BLUE CROSS/BLUE SHIELD | Admitting: Certified Nurse Midwife

## 2018-01-18 ENCOUNTER — Other Ambulatory Visit: Payer: Self-pay | Admitting: Certified Nurse Midwife

## 2018-01-18 ENCOUNTER — Encounter: Payer: Self-pay | Admitting: Certified Nurse Midwife

## 2018-01-18 VITALS — BP 112/53 | HR 75 | Ht 69.0 in | Wt 153.5 lb

## 2018-01-18 DIAGNOSIS — N921 Excessive and frequent menstruation with irregular cycle: Secondary | ICD-10-CM | POA: Diagnosis not present

## 2018-01-18 DIAGNOSIS — Z86718 Personal history of other venous thrombosis and embolism: Secondary | ICD-10-CM | POA: Diagnosis not present

## 2018-01-18 DIAGNOSIS — R1030 Lower abdominal pain, unspecified: Secondary | ICD-10-CM | POA: Diagnosis not present

## 2018-01-18 DIAGNOSIS — Z01419 Encounter for gynecological examination (general) (routine) without abnormal findings: Secondary | ICD-10-CM | POA: Diagnosis not present

## 2018-01-18 DIAGNOSIS — N939 Abnormal uterine and vaginal bleeding, unspecified: Secondary | ICD-10-CM

## 2018-01-18 DIAGNOSIS — Z124 Encounter for screening for malignant neoplasm of cervix: Secondary | ICD-10-CM | POA: Diagnosis not present

## 2018-01-18 DIAGNOSIS — N809 Endometriosis, unspecified: Secondary | ICD-10-CM

## 2018-01-18 DIAGNOSIS — Z86711 Personal history of pulmonary embolism: Secondary | ICD-10-CM | POA: Diagnosis not present

## 2018-01-18 DIAGNOSIS — R102 Pelvic and perineal pain: Secondary | ICD-10-CM | POA: Diagnosis not present

## 2018-01-18 NOTE — Progress Notes (Signed)
ANNUAL PREVENTATIVE CARE GYN  ENCOUNTER NOTE  Subjective:       Ana Knapp is a 37 y.o. G0P0000 female here for a routine annual gynecologic exam.  Current complaints: 1. Pelvic pain/lower abdominal pain 2. Abnormal uterine bleeding/Menorrhagia with irregular cycle 3. Patient believes she has endometriosis  Reports intermittent breast tenderness, pelvic and lower abdominal pain with continuous cramping, fatigue, n/v/d, and constipation for the last year that worsens with her menstrual cycle. No relief with home treatment measures.   "Stomach symptoms" started in 11/2017 and patient recently had colonoscopy. She has a follow up appointment scheduled next month to discuss the results.   Denies difficulty breathing or respiratory distress, chest pain, excessive vaginal bleeding, dysuria, and leg pain or swelling. She is wearing compression stockings during today's visit.   History significant for pulmonary embolism and DVT while taking OCPs at 37 years old.    Gynecologic History  Patient's last menstrual period was 01/13/2018 (exact date).  Period Duration (Days): Five (5) to six (6) Period Pattern: (!) Irregular Menstrual Flow: Heavy Menstrual Control: Maxi pad Menstrual Control Change Freq (Hours): One (1) to two (2)  Dysmenorrhea: (!) Severe Dysmenorrhea Symptoms: Cramping, Nausea, Diarrhea, Other (Comment)(Back pain, Thigh pain, constipation)  Contraception: none  Last Pap: Four (4) or five (5) years ago. Results were: normal  Last colonoscopy: 12/2017. Results were: abnormal  Obstetric History  OB History  Gravida Para Term Preterm AB Living  0 0 0 0 0 0  SAB TAB Ectopic Multiple Live Births  0 0 0 0 0    Past Medical History:  Diagnosis Date  . Chronic sinusitis   . Fibromyalgia   . GERD (gastroesophageal reflux disease)   . Headache    sinus  . History of melanoma 11/22/2017  . History of melanoma 2018   Back  . 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 PONV (postoperative nausea and vomiting)   . Pulmonary embolism [facility])     Past Surgical History:  Procedure Laterality Date  . ANGIOPLASTY / STENTING FEMORAL  March 2017  . CHOLECYSTECTOMY    . COLONOSCOPY WITH PROPOFOL N/A 01/02/2018   Procedure: COLONOSCOPY WITH PROPOFOL;  Surgeon: Lucilla Lame, MD;  Location: [facility] ENDOSCOPY;  Service: Endoscopy;  Laterality: N/A;  . ESOPHAGOGASTRODUODENOSCOPY (EGD) WITH PROPOFOL N/A 01/02/2018   Procedure: ESOPHAGOGASTRODUODENOSCOPY (EGD) WITH PROPOFOL;  Surgeon: Lucilla Lame, MD;  Location: [facility] ENDOSCOPY;  Service: Endoscopy;  Laterality: N/A;  . Lytics Catheter Placement  11/16/15  . MELANOMA EXCISION  2018   Back  . ROBOTIC ASSISTED LAPAROSCOPIC CHOLECYSTECTOMY-SINGLE SITE  12/29/2016    Current Outpatient Medications on File Prior to Visit  Medication Sig Dispense Refill  . acetaminophen (TYLENOL) 325 MG tablet Take 650 mg by mouth every 6 (six) hours as needed.    . Cholecalciferol (VITAMIN D PO) Take 1,000 Units by mouth daily.     Marland Kitchen dexlansoprazole (DEXILANT) 60 MG capsule Take 1 capsule (60 mg total) by mouth at bedtime. 30 capsule 6  . loratadine (CLARITIN) 10 MG tablet Take 10 mg by mouth daily.    . montelukast (SINGULAIR) 10 MG tablet Take 1 tablet (10 mg total) by mouth at bedtime. 30 tablet 11  . Multiple Vitamin (MULTIVITAMIN) tablet Take 1 tablet by mouth daily.    . ondansetron (ZOFRAN ODT) 4 MG disintegrating tablet Take 1 tablet (4 mg total) by mouth every 8 (eight) hours as needed for nausea or vomiting. 20 tablet  0  . polyethylene glycol (MIRALAX / GLYCOLAX) packet Take 17 g by mouth daily as needed.    Marland Kitchen PREVIDENT 5000 ENAMEL PROTECT 1.1-5 % PSTE   0  . sucralfate (CARAFATE) 1 g tablet Take 1 tablet (1 g total) by mouth 4 (four) times daily -  with meals and at bedtime. (Patient not taking: Reported on 01/18/2018) 56 tablet 0   No current facility-administered medications on file prior to  visit.     Allergies  Allergen Reactions  . Ortho Tri-Cyclen [Norgestimate-Eth Estradiol] Other (See Comments)    Blood clot and PE  . Prednisone Anaphylaxis  . Augmentin [Amoxicillin-Pot Clavulanate] Other (See Comments)    Upsets her stomach    Social History   Socioeconomic History  . Marital status: Married    Spouse name: Not on file  . Number of children: Not on file  . Years of education: Not on file  . Highest education level: Not on file  Occupational History  . Not on file  Social Needs  . Financial resource strain: Not on file  . Food insecurity:    Worry: Not on file    Inability: Not on file  . Transportation needs:    Medical: Not on file    Non-medical: Not on file  Tobacco Use  . Smoking status: Never Smoker  . Smokeless tobacco: Never Used  Substance and Sexual Activity  . Alcohol use: Not Currently    Comment: rare  . Drug use: No  . Sexual activity: Not Currently    Birth control/protection: None  Lifestyle  . Physical activity:    Days per week: Not on file    Minutes per session: Not on file  . Stress: Not on file  Relationships  . Social connections:    Talks on phone: Not on file    Gets together: Not on file    Attends religious service: Not on file    Active member of club or organization: Not on file    Attends meetings of clubs or organizations: Not on file    Relationship status: Not on file  . Intimate partner violence:    Fear of current or ex partner: Not on file    Emotionally abused: Not on file    Physically abused: Not on file    Forced sexual activity: Not on file  Other Topics Concern  . Not on file  Social History Narrative  . Not on file    Family History  Problem Relation Age of Onset  . Thyroid disease Mother   . Diabetes Mother   . Cancer Father        bladder  . Hypertension Maternal Grandmother   . Thyroid disease Paternal Grandmother   . Diabetes Paternal Grandmother   . Stroke Paternal Grandmother    . Cancer Paternal Grandfather        skin  . Heart disease Neg Hx   . COPD Neg Hx     The following portions of the patient's history were reviewed and updated as appropriate: allergies, current medications, past family history, past medical history, past social history, past surgical history and problem list.  Review of Systems  ROS negative except as noted above. Information obtained from patient.    Objective:   BP (!) 112/53   Pulse 75   Ht 5\' 9"  (1.753 m)   Wt 153 lb 8 oz (69.6 kg)   LMP 01/13/2018 (Exact Date) Comment: neg preg test  BMI 22.67  kg/m    CONSTITUTIONAL: Well-developed, well-nourished female in no acute distress.   PSYCHIATRIC: Normal mood and affect. Normal behavior. Normal judgment and thought content.  [place]: Alert and oriented to person, place, and time. Normal muscle tone coordination. No cranial nerve deficit noted.  HENT:  Normocephalic, atraumatic, External right and left ear normal.   EYES: Conjunctivae and EOM are normal. Pupils are equal and round. No scleral icterus.   NECK: Normal range of motion, supple, no masses.  Normal thyroid.   SKIN: Skin is warm and dry. No rash noted. Not diaphoretic. No erythema. No pallor.  CARDIOVASCULAR: Normal heart rate noted, regular rhythm, no murmur.  RESPIRATORY: Clear to auscultation bilaterally. Effort and breath sounds normal, no problems with respiration noted.  BREASTS: Symmetric in size. No masses, skin changes, nipple drainage, or lymphadenopathy. Tender to touch.   ABDOMEN: Soft, normal bowel sounds, no distention noted.  No guarding. Bilateral lower quadrant tender to touch.   PELVIC:  External Genitalia: Normal  Vagina: Normal  Cervix: Normal  Uterus: Normal  Adnexa: Normal   MUSCULOSKELETAL: Normal range of motion. No tenderness.  No cyanosis, clubbing, or edema.  2+ distal pulses.  LYMPHATIC: No Axillary, Supraclavicular, or Inguinal Adenopathy.  Assessment:   Annual  gynecologic examination 37 y.o.   Contraception: none   Normal BMI   Problem List Items Addressed This Visit      Other   Abdominal pain   History of pulmonary embolism    Other Visit Diagnoses    Well woman exam with routine gynecological exam    -  Primary   Relevant Orders   Estradiol   Ferritin   FSH/LH   PapIG, HPV, rfx 16/18   Pelvic pain       Abnormal uterine bleeding       Relevant Orders   Estradiol   Ferritin   FSH/LH   Menorrhagia with irregular cycle       History of DVT in adulthood       Screening for cervical cancer       Relevant Orders   PapIG, HPV, rfx 16/18      Plan:   Pap: Pap Co Test.  Labs: See orders.  Routine preventative health maintenance measures emphasized: Exercise/Diet/Weight control, Alcohol/Substance use risks and Stress Management.  Handouts given regarding chronic pelvic pain and endometriosis.  Reviewed red flag symptoms and when to call.   RTC x 1-2 weeks for [place] and MD follow up or sooner if needed.    Diona Fanti, CNM [facility], [facility]

## 2018-01-18 NOTE — Progress Notes (Signed)
New pt is here on referral from [place] for poss endometriosis. C/o breast tenderness, cramping at the end of her period,During her period she has constipation,nausea,diarrhea,fatigue and lower abd sensitivity.

## 2018-01-18 NOTE — Patient Instructions (Signed)
Endometriosis Endometriosis is a condition in which the tissue that lines the uterus (endometrium) grows outside of its normal location. The tissue may grow in many locations close to the uterus, but it commonly grows on the ovaries, fallopian tubes, vagina, or bowel. When the uterus sheds the endometrium every menstrual cycle, there is bleeding wherever the endometrial tissue is located. This can cause pain because blood is irritating to tissues that are not normally exposed to it. What are the causes? The cause of endometriosis is not known. What increases the risk? You may be more likely to develop endometriosis if you:  Have a family history of endometriosis.  Have never given birth.  Started your period at age 10 or younger.  Have high levels of estrogen in your body.  Were exposed to a certain medicine (diethylstilbestrol) before you were born (in utero).  Had low birth weight.  Were born as a twin, triplet, or other multiple.  Have a BMI of less than 25. BMI is an estimate of body fat and is calculated from height and weight.  What are the signs or symptoms? Often, there are no symptoms of this condition. If you do have symptoms, they may:  Vary depending on where your endometrial tissue is growing.  Occur during your menstrual period (most common) or midcycle.  Come and go, or you may go months with no symptoms at all.  Stop with menopause.  Symptoms may include:  Pain in the back or abdomen.  Heavier bleeding during periods.  Pain during sex.  Painful bowel movements.  Infertility.  Pelvic pain.  Bleeding more than once a month.  How is this diagnosed? This condition is diagnosed based on your symptoms and a physical exam. You may have tests, such as:  Blood tests and urine tests. These may be done to help rule out other possible causes of your symptoms.  Ultrasound, to look for abnormal tissues.  An X-ray of the lower bowel (barium enema).  An  ultrasound that is done through the vagina (transvaginally).  CT scan.  MRI.  Laparoscopy. In this procedure, a lighted, pencil-sized instrument called a laparoscope is inserted into your abdomen through an incision. The laparoscope allows your health care provider to look at the organs inside your body and check for abnormal tissue to confirm the diagnosis. If abnormal tissue is found, your health care provider may remove a small piece of tissue (biopsy) to be examined under a microscope.  How is this treated? Treatment for this condition may include:  Medicines to relieve pain, such as NSAIDs.  Hormone therapy. This involves using artificial (synthetic) hormones to reduce endometrial tissue growth. Your health care provider may recommend using a hormonal form of birth control, or other medicines.  Surgery. This may be done to remove abnormal endometrial tissue. ? In some cases, tissue may be removed using a laparoscope and a laser (laparoscopic laser treatment). ? In severe cases, surgery may be done to remove the fallopian tubes, uterus, and ovaries (hysterectomy).  Follow these instructions at home:  Take over-the-counter and prescription medicines only as told by your health care provider.  Do not drive or use heavy machinery while taking prescription pain medicine.  Try to avoid activities that cause pain, including sexual activity.  Keep all follow-up visits as told by your health care provider. This is important. Contact a health care provider if:  You have pain in the area between your hip bones (pelvic area) that occurs: ? Before, during, or   after your period. ? In between your period and gets worse during your period. ? During or after sex. ? With bowel movements or urination, especially during your period.  You have problems getting pregnant.  You have a fever. Get help right away if:  You have severe pain that does not get better with medicine.  You have severe  nausea and vomiting, or you cannot eat without vomiting.  You have pain that affects only the lower, right side of your abdomen.  You have abdominal pain that gets worse.  You have abdominal swelling.  You have blood in your stool. This information is not intended to replace advice given to you by your health care provider. Make sure you discuss any questions you have with your health care provider. Document Released: 10/07/2000 Document Revised: 07/15/2016 Document Reviewed: 03/12/2016 Elsevier Interactive Patient Education  2018 [place]. Pelvic Pain, Female Pelvic pain is pain in your lower belly (abdomen), below your belly button and between your hips. The pain may start suddenly (acute), keep coming back (recurring), or last a long time (chronic). Pelvic pain that lasts longer than six months is considered chronic. There are many causes of pelvic pain. Sometimes the cause of your pelvic pain is not known. Follow these instructions at home:  Take over-the-counter and prescription medicines only as told by your doctor.  Rest as told by your doctor.  Do not have sex it if hurts.  Keep a journal of your pelvic pain. Write down: ? When the pain started. ? Where the pain is located. ? What seems to make the pain better or worse, such as food or your menstrual cycle. ? Any symptoms you have along with the pain.  Keep all follow-up visits as told by your doctor. This is important. Contact a doctor if:  Medicine does not help your pain.  Your pain comes back.  You have new symptoms.  You have unusual vaginal discharge or bleeding.  You have a fever or chills.  You are having a hard time pooping (constipation).  You have blood in your pee (urine) or poop (stool).  Your pee smells bad.  You feel weak or lightheaded. Get help right away if:  You have sudden pain that is very bad.  Your pain continues to get worse.  You have very bad pain and also have any of the  following symptoms: ? A fever. ? Feeling stick to your stomach (nausea). ? Throwing up (vomiting). ? Being very sweaty.  You pass out (lose consciousness). This information is not intended to replace advice given to you by your health care provider. Make sure you discuss any questions you have with your health care provider. Document Released: 03/28/2008 Document Revised: 11/04/2015 Document Reviewed: 07/31/2015 Elsevier Interactive Patient Education  2018 [place]. Abnormal Uterine Bleeding Abnormal uterine bleeding means bleeding more than usual from your uterus. It can include:  Bleeding between periods.  Bleeding after sex.  Bleeding that is heavier than normal.  Periods that last longer than usual.  Bleeding after you have stopped having your period (menopause).  There are many problems that may cause this. You should see a doctor for any kind of bleeding that is not normal. Treatment depends on the cause of the bleeding. Follow these instructions at home:  Watch your condition for any changes.  Do not use tampons, douche, or have sex, if your doctor tells you not to.  Change your pads often.  Get regular well-woman exams. Make sure  they include a pelvic exam and cervical cancer screening.  Keep all follow-up visits as told by your doctor. This is important. Contact a doctor if:  The bleeding lasts more than one week.  You feel dizzy at times.  You feel like you are going to throw up (nauseous).  You throw up. Get help right away if:  You pass out.  You have to change pads every hour.  You have belly (abdominal) pain.  You have a fever.  You get sweaty.  You get weak.  You passing large blood clots from your vagina. Summary  Abnormal uterine bleeding means bleeding more than usual from your uterus.  There are many problems that may cause this. You should see a doctor for any kind of bleeding that is not normal.  Treatment depends on the cause  of the bleeding. This information is not intended to replace advice given to you by your health care provider. Make sure you discuss any questions you have with your health care provider. Document Released: 08/07/2009 Document Revised: 10/04/2016 Document Reviewed: 10/04/2016 Elsevier Interactive Patient Education  2017 [employer].

## 2018-01-19 ENCOUNTER — Encounter: Payer: Self-pay | Admitting: Family Medicine

## 2018-01-19 LAB — FSH/LH
FSH: 6.2 m[IU]/mL
LH: 8.5 m[IU]/mL

## 2018-01-19 LAB — ESTRADIOL: Estradiol: 55.8 pg/mL

## 2018-01-19 LAB — FERRITIN: Ferritin: 30 ng/mL (ref 15–150)

## 2018-01-20 LAB — PAPIG, HPV, RFX 16/18
HPV, high-risk: NEGATIVE
PAP Smear Comment: 0

## 2018-01-29 ENCOUNTER — Encounter: Payer: Self-pay | Admitting: *Deleted

## 2018-01-29 ENCOUNTER — Encounter: Payer: Self-pay | Admitting: Gastroenterology

## 2018-01-29 ENCOUNTER — Ambulatory Visit (INDEPENDENT_AMBULATORY_CARE_PROVIDER_SITE_OTHER): Payer: BLUE CROSS/BLUE SHIELD | Admitting: Gastroenterology

## 2018-01-29 VITALS — BP 107/58 | HR 77 | Ht 69.0 in | Wt 153.0 lb

## 2018-01-29 DIAGNOSIS — R112 Nausea with vomiting, unspecified: Secondary | ICD-10-CM

## 2018-01-29 DIAGNOSIS — K5904 Chronic idiopathic constipation: Secondary | ICD-10-CM

## 2018-01-29 NOTE — Progress Notes (Signed)
Primary Care Physician: Arnetha Courser, MD  Primary Gastroenterologist:  Dr. Lucilla Lame  Chief Complaint  Patient presents with  . Follow up procedure results    Colonoscopy/EGD    HPI: Ana Knapp is a 37 y.o. female here for follow-up at the having an EGD and colonoscopy.  The patient had an EGD showing some mild chronic gastritis without any cause for her nausea.  The patient also was found to have mild active colitis consistent with NSAIDs versus reaction to the prep versus infectious process. The patient continues to report that her main concern is that she has chronic abdominal pain and constipation with nausea.  The patient states that the nausea is much better when she moves her bowels.  The patient also states that she has been found to have endometriosis and is seeing OB/GYN for this.  Current Outpatient Medications  Medication Sig Dispense Refill  . acetaminophen (TYLENOL) 325 MG tablet Take 650 mg by mouth every 6 (six) hours as needed.    . Cholecalciferol (VITAMIN D PO) Take 1,000 Units by mouth daily.     Marland Kitchen dexlansoprazole (DEXILANT) 60 MG capsule Take 1 capsule (60 mg total) by mouth at bedtime. 30 capsule 6  . loratadine (CLARITIN) 10 MG tablet Take 10 mg by mouth daily.    . montelukast (SINGULAIR) 10 MG tablet Take 1 tablet (10 mg total) by mouth at bedtime. 30 tablet 11  . Multiple Vitamin (MULTIVITAMIN) tablet Take 1 tablet by mouth daily.    . ondansetron (ZOFRAN ODT) 4 MG disintegrating tablet Take 1 tablet (4 mg total) by mouth every 8 (eight) hours as needed for nausea or vomiting. 20 tablet 0  . PREVIDENT 5000 ENAMEL PROTECT 1.1-5 % PSTE   0  . polyethylene glycol (MIRALAX / GLYCOLAX) packet Take 17 g by mouth daily as needed.    . sucralfate (CARAFATE) 1 g tablet Take 1 tablet (1 g total) by mouth 4 (four) times daily -  with meals and at bedtime. (Patient not taking: Reported on 01/18/2018) 56 tablet 0   No current facility-administered medications  for this visit.     Allergies as of 01/29/2018 - Review Complete 01/29/2018  Allergen Reaction Noted  . Ortho tri-cyclen [norgestimate-eth estradiol] Other (See Comments) 11/30/2015  . Prednisone Anaphylaxis 11/12/2015  . Augmentin [amoxicillin-pot clavulanate] Other (See Comments) 08/26/2015    ROS:  General: Negative for anorexia, weight loss, fever, chills, fatigue, weakness. ENT: Negative for hoarseness, difficulty swallowing , nasal congestion. CV: Negative for chest pain, angina, palpitations, dyspnea on exertion, peripheral edema.  Respiratory: Negative for dyspnea at rest, dyspnea on exertion, cough, sputum, wheezing.  GI: See history of present illness. GU:  Negative for dysuria, hematuria, urinary incontinence, urinary frequency, nocturnal urination.  Endo: Negative for unusual weight change.    Physical Examination:   BP (!) 107/58   Pulse 77   Ht 5\' 9"  (1.753 m)   Wt 153 lb (69.4 kg)   LMP 01/13/2018 (Exact Date) Comment: neg preg test  BMI 22.59 kg/m   General: Well-nourished, well-developed in no acute distress.  Eyes: No icterus. Conjunctivae pink. Mouth: Oropharyngeal mucosa moist and pink , no lesions erythema or exudate. Lungs: Clear to auscultation bilaterally. Non-labored. Heart: Regular rate and rhythm, no murmurs rubs or gallops.  Abdomen: Bowel sounds are normal, nontender, nondistended, no hepatosplenomegaly or masses, no abdominal bruits or hernia , no rebound or guarding.   Extremities: No lower extremity edema. No clubbing or deformities. Neuro:  Alert and oriented x 3.  Grossly intact. Skin: Warm and dry, no jaundice.   Psych: Alert and cooperative, normal mood and affect.  Labs:    Imaging Studies: No results found.  Assessment and Plan:   Ana Knapp is a 37 y.o. y/o female who comes for follow-up after having an EGD and colonoscopy.  The patient did not have any source of her constipation or nausea seen.  The patient has been told  that her mild active colitis in the sigmoid colon was likely due to a reaction to the prep versus taking anti-inflammatory medications.  The patient denies any diarrhea or symptoms of colitis.  The patient will be started on Linzess 145 g once a day.  She will continue the MiraLAX she is taking.  She states that her symptoms are better when she moves her bowels and her nausea should improve with bowel movements. The patient will also continue the [place] she was started on.  The patient has been explained the plan and agrees with it.    Lucilla Lame, MD. Marval Regal   Note: This dictation was prepared with Dragon dictation along with smaller phrase technology. Any transcriptional errors that result from this process are unintentional.

## 2018-01-30 ENCOUNTER — Encounter: Payer: Self-pay | Admitting: Gastroenterology

## 2018-01-30 NOTE — Telephone Encounter (Signed)
Error

## 2018-02-01 ENCOUNTER — Ambulatory Visit (INDEPENDENT_AMBULATORY_CARE_PROVIDER_SITE_OTHER): Payer: BLUE CROSS/BLUE SHIELD

## 2018-02-01 DIAGNOSIS — N809 Endometriosis, unspecified: Secondary | ICD-10-CM | POA: Diagnosis not present

## 2018-02-07 ENCOUNTER — Encounter: Payer: BLUE CROSS/BLUE SHIELD | Admitting: Obstetrics and Gynecology

## 2018-02-13 ENCOUNTER — Ambulatory Visit (INDEPENDENT_AMBULATORY_CARE_PROVIDER_SITE_OTHER): Payer: BLUE CROSS/BLUE SHIELD | Admitting: Obstetrics and Gynecology

## 2018-02-13 ENCOUNTER — Encounter: Payer: Self-pay | Admitting: Family Medicine

## 2018-02-13 ENCOUNTER — Encounter: Payer: Self-pay | Admitting: Obstetrics and Gynecology

## 2018-02-13 VITALS — BP 109/55 | HR 78 | Ht 69.0 in | Wt 154.6 lb

## 2018-02-13 DIAGNOSIS — R102 Pelvic and perineal pain: Secondary | ICD-10-CM | POA: Diagnosis not present

## 2018-02-13 DIAGNOSIS — N921 Excessive and frequent menstruation with irregular cycle: Secondary | ICD-10-CM

## 2018-02-13 DIAGNOSIS — Z86718 Personal history of other venous thrombosis and embolism: Secondary | ICD-10-CM

## 2018-02-13 DIAGNOSIS — N941 Unspecified dyspareunia: Secondary | ICD-10-CM | POA: Diagnosis not present

## 2018-02-13 NOTE — Patient Instructions (Signed)
Diagnostic Laparoscopy  A diagnostic laparoscopy is a procedure to diagnose diseases in the abdomen. During the procedure, a thin, lighted, pencil-sized instrument called a laparoscope is inserted into the abdomen through an incision. The laparoscope allows your health care provider to look at the organs inside your body.  Tell a health care provider about:   Any allergies you have.   All medicines you are taking, including vitamins, herbs, eye drops, creams, and over-the-counter medicines.   Any problems you or family members have had with anesthetic medicines.   Any blood disorders you have.   Any surgeries you have had.   Any medical conditions you have.  What are the risks?  Generally, this is a safe procedure. However, problems can occur, which may include:   Infection.   Bleeding.   Damage to other organs.   Allergic reaction to the anesthetics used during the procedure.    What happens before the procedure?   Do not eat or drink anything after midnight on the night before the procedure or as directed by your health care provider.   Ask your health care provider about:  ? Changing or stopping your regular medicines.  ? Taking medicines such as aspirin and ibuprofen. These medicines can thin your blood. Do not take these medicines before your procedure if your health care provider instructs you not to.   Plan to have someone take you home after the procedure.  What happens during the procedure?   You may be given a medicine to help you relax (sedative).   You will be given a medicine to make you sleep (general anesthetic).   Your abdomen will be inflated with a gas. This will make your organs easier to see.   Small incisions will be made in your abdomen.   A laparoscope and other small instruments will be inserted into the abdomen through the incisions.   A tissue sample may be removed from an organ in the abdomen for examination.   The instruments will be removed from the abdomen.   The  gas will be released.   The incisions will be closed with stitches (sutures).  What happens after the procedure?  Your blood pressure, heart rate, breathing rate, and blood oxygen level will be monitored often until the medicines you were given have worn off.  This information is not intended to replace advice given to you by your health care provider. Make sure you discuss any questions you have with your health care provider.  Document Released: 01/16/2001 Document Revised: 02/18/2016 Document Reviewed: 05/23/2014  Elsevier Interactive Patient Education  2018 [employer]

## 2018-02-13 NOTE — Progress Notes (Signed)
Pt is having abd pain and pain with sexual intercourse x 2 months.

## 2018-02-14 ENCOUNTER — Encounter: Payer: Self-pay | Admitting: Obstetrics and Gynecology

## 2018-02-14 ENCOUNTER — Telehealth: Payer: Self-pay | Admitting: Gastroenterology

## 2018-02-14 NOTE — Telephone Encounter (Signed)
Patient states that she has been taking linzess 72 mcg daily for about two weeks now.  Since she has been taking them, she has experienced having no bm to diarrhea back alternating with lack of bowel movements back to diarrhea.  She would like to know if this medication can be changed.  In addition to this she is having surgery on May 6th for endometriosis.  Her right ovary is affixed to her uterus-wonders if this could be related to her diarrhea as well.    Please advise.  Thanks [employer]

## 2018-02-14 NOTE — Telephone Encounter (Signed)
Hi, Let the patient know we can change it to Amitiza 70mcg twice a day and see if that helps her. We will not know if the endometriosis is the issue until it gets fixed.

## 2018-02-14 NOTE — Telephone Encounter (Signed)
Patient just starting taking Linzess and it is effecting her different daily.  She needs to talk to someone ASAP due to an upcoming laparoscopic surgery on May 6. Please call

## 2018-02-15 ENCOUNTER — Other Ambulatory Visit: Payer: Self-pay

## 2018-02-15 MED ORDER — LUBIPROSTONE 8 MCG PO CAPS: 8.0000 ug | ORAL_CAPSULE | Freq: Two times a day (BID) | ORAL | 0 refills | Status: DC

## 2018-02-15 NOTE — Telephone Encounter (Signed)
Switched medication from [place] to [place] per Dr. Allen Norris. Responded to endometriosis question:  - Let the patient know we can change it to Amitiza 61mcg twice a day and see if that helps her. We will not know if the endometriosis is the issue until it gets fixed.    Patient agreed with new treatment plan.  Sent Amitiza 68mcg Rx to pharmacy.

## 2018-02-16 ENCOUNTER — Encounter: Payer: Self-pay | Admitting: Obstetrics and Gynecology

## 2018-02-16 NOTE — Progress Notes (Signed)
GYNECOLOGY PROGRESS NOTE  Subjective:    Patient ID: Ana Knapp, female    DOB: Jul 28, 1981, 37 y.o.   MRN: [id number]  HPI  Patient is a 37 y.o. [id number] female who presents as a referral from Dani Gobble, CNM for further follow up evaluation and management of menorrhagia with irregular cycles, pelvic pain and dyspareunia.  She also presents to review recent ultrasound results. Patient believes she may have endometriosis.  She has been having symptoms of abdominal pain over the past 2 months. She has had a colonoscopy and endoscopy with essentially normal findings.   She reports intermittent breast tenderness, pelvic and lower abdominal pain with continuous cramping, fatigue, n/v/d, and constipation for the last year that worsens with her menstrual cycle. No relief with home treatment measures.   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 Is concerned regarding which options may be available for her for treatment if endometriosis is diagnosed.  Also worries how endometriosis and her history of thromboembolic events would affect a potential pregnancy as she and her husband have been discussing their future fertility desires lately.   The following portions of the patient's history were reviewed and updated as appropriate: allergies, current medications, past family history, past medical history, past social history, past surgical history and problem list.   Review of Systems Pertinent items noted in HPI and remainder of comprehensive ROS otherwise negative.   Objective:   Blood pressure (!) 109/55, pulse 78, height 5\' 9"  (1.753 m), weight 154 lb 9.6 oz (70.1 kg), last menstrual period 02/10/2018. General appearance: alert and no distress Abdomen: soft, non-tender; bowel sounds normal; no masses,  no organomegaly Pelvic: deferred Extremities: extremities normal, atraumatic, no cyanosis or edema    Imaging:   US  Transvaginal Non-OB ULTRASOUND REPORT  Location: [facility] Date of Service:  02/01/2018  Indications: Endometriosis Findings:  The uterus measures 8.2 x 4.7 x 4.3 cm. Echo texture is homogeneous without evidence of focal masses. The Endometrium measures 9.3 mm.  Right Ovary measures 2.7 x 1.9 x 1.7 cm. It is normal in appearance,  however, it appears affixed to the uterus. Left Ovary measures 2.0 x 1.3 x 1.3 cm. It is normal appearance. Survey of the adnexa demonstrates no adnexal masses. There free fluid in the anterior cul de sac.  Impression: 1. Anteverted uterus appears of normal size and contour. 2. The endometrium measures 9.3 mm. 3. Bilateral ovaries appear WNL, however, the right ovary appears affixed  in place against the uterus. 4. Free fluid noted in the anterior cul de sac.  Recommendations: 1.Clinical correlation with the patient's History and Physical Exam.  Dario Ave, RDMS  The ultrasound images and findings were reviewed by me and I agree with  the above report.  Finis Bud, M.D. 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  No significant findings on ultrasound except for possibly fixed right ovary to uterus (may represent adhesions) that can explain patient's pelvic pain.  Discussed evaluation for endometriosis, including gold-standard of diagnostic laparoscopy with biopsies. Also discussed trial of empiric treatment with either progesterone-only contraception, or other hormonal suppression with Aygestin, Danazol. Patient desires to have laparoscopy performed.   - Patient desires surgical management with laparoscopy with biopsies.  The risks  of surgery were discussed in detail with the patient including but not limited to: bleeding which may require transfusion or  reoperation; infection which may require prolonged hospitalization or re-hospitalization and antibiotic therapy; injury to bowel, bladder, ureters and major vessels or other surrounding organs; need for additional procedures including laparotomy; thromboembolic phenomenon, incisional problems and other postoperative or anesthesia complications.  The postoperative expectations were also discussed in detail. The patient also understands the alternative treatment options which were discussed in full.  All questions were answered.  She was told that she will be contacted by our surgical scheduler regarding the time and date of her surgery; routine preoperative instructions of having nothing to eat or drink after midnight on the day prior to surgery and also coming to the hospital 1.5 hours prior to her time of surgery were also emphasized.  She was told she may be called for a preoperative appointment about a week prior to surgery and will be given further preoperative instructions at that visit. Printed patient education handouts about the procedure were given to the patient to review at home.  Surgery to be scheduled for Feb 26, 2018.  - If endometriosis found, discussed options for treatment, including above listed (progesterone-only contraception, Aygestin, Danazole), as well as Depot Lupron, [place]. Also discussed that if patient decides to conceive, she should begin trying soon, as also would be advanced maternal age at time of pregnancy. Pregnancy would be high risk due to her age, as well as her h/o thromboembolic events.    A total of 30 minutes were spent face-to-face with the patient during the encounter with greater than 50% dealing with counseling and coordination of care.   Rubie Maid, MD [facility]

## 2018-02-16 NOTE — H&P (Signed)
@[place] @   GYNECOLOGY PREOPERATIVE HISTORY AND PHYSICAL   Subjective:  Ana Knapp is a 37 y.o. G0P0000 here for surgical assessment of pelvic pain, dyspareunia, menorrhagia with irregular cycles.  Significant preoperative concerns include prior h/o DVT and PE (~ 10 years ago).   Proposed surgery: Operative laparoscopy with biopsies   Pertinent Gynecological History: Menses: flow is excessive with use of 5-6 pads or tampons on heaviest days and with severe dysmenorrhea. Sometimes irregular.  Bleeding: dysfunctional uterine bleeding Contraception: coitus interruptus Last pap: normal Date: 12/2017   Past Medical History:  Diagnosis Date  . Chronic sinusitis   . Fibromyalgia   . GERD (gastroesophageal reflux disease)   . Headache    sinus  . History of melanoma 11/22/2017  . History of melanoma 2018   Back  . May-Thurner syndrome 11/30/2015   January 2017; 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[facility]   . PONV (postoperative nausea and vomiting)   . Pulmonary embolism [facility])     Past Surgical History:  Procedure Laterality Date  . ANGIOPLASTY / STENTING FEMORAL  March 2017  . CHOLECYSTECTOMY    . COLONOSCOPY WITH PROPOFOL N/A 01/02/2018   Procedure: COLONOSCOPY WITH PROPOFOL;  Surgeon: Lucilla Lame, MD;  Location: [facility] ENDOSCOPY;  Service: Endoscopy;  Laterality: N/A;  . ESOPHAGOGASTRODUODENOSCOPY (EGD) WITH PROPOFOL N/A 01/02/2018   Procedure: ESOPHAGOGASTRODUODENOSCOPY (EGD) WITH PROPOFOL;  Surgeon: Lucilla Lame, MD;  Location: [facility] ENDOSCOPY;  Service: Endoscopy;  Laterality: N/A;  . Lytics Catheter Placement  11/16/15  . MELANOMA EXCISION  2018   Back  . ROBOTIC ASSISTED LAPAROSCOPIC CHOLECYSTECTOMY-SINGLE SITE  12/29/2016    OB History  Gravida Para Term Preterm AB Living  0 0 0 0 0 0  SAB TAB Ectopic Multiple Live Births  0 0 0 0 0    Family History  Problem Relation Age of Onset  . Thyroid disease Mother   . Diabetes Mother   . Cancer Father        bladder  .  Hypertension Maternal Grandmother   . Thyroid disease Paternal Grandmother   . Diabetes Paternal Grandmother   . Stroke Paternal Grandmother   . Cancer Paternal Grandfather        skin  . Heart disease Neg Hx   . COPD Neg Hx     Social History   Socioeconomic History  . Marital status: Married    Spouse name: Not on file  . Number of children: Not on file  . Years of education: Not on file  . Highest education level: Not on file  Occupational History  . Not on file  Social Needs  . Financial resource strain: Not on file  . Food insecurity:    Worry: Not on file    Inability: Not on file  . Transportation needs:    Medical: Not on file    Non-medical: Not on file  Tobacco Use  . Smoking status: Never Smoker  . Smokeless tobacco: Never Used  Substance and Sexual Activity  . Alcohol use: Not Currently    Comment: rare  . Drug use: No  . Sexual activity: Not Currently    Birth control/protection: None  Lifestyle  . Physical activity:    Days per week: Not on file    Minutes per session: Not on file  . Stress: Not on file  Relationships  . Social connections:    Talks on phone: Not on file    Gets together: Not on file    Attends religious service: Not  on file    Active member of club or organization: Not on file    Attends meetings of clubs or organizations: Not on file    Relationship status: Not on file  . Intimate partner violence:    Fear of current or ex partner: Not on file    Emotionally abused: Not on file    Physically abused: Not on file    Forced sexual activity: Not on file  Other Topics Concern  . Not on file  Social History Narrative  . Not on file    Current Outpatient Medications on File Prior to Visit  Medication Sig Dispense Refill  . acetaminophen (TYLENOL) 325 MG tablet Take 650 mg by mouth every 6 (six) hours as needed.    . Cholecalciferol (VITAMIN D PO) Take 1,000 Units by mouth daily.     Marland Kitchen dexlansoprazole (DEXILANT) 60 MG capsule  Take 1 capsule (60 mg total) by mouth at bedtime. 30 capsule 6  . loratadine (CLARITIN) 10 MG tablet Take 10 mg by mouth daily.    . montelukast (SINGULAIR) 10 MG tablet Take 1 tablet (10 mg total) by mouth at bedtime. 30 tablet 11  . Multiple Vitamin (MULTIVITAMIN) tablet Take 1 tablet by mouth daily.    . ondansetron (ZOFRAN ODT) 4 MG disintegrating tablet Take 1 tablet (4 mg total) by mouth every 8 (eight) hours as needed for nausea or vomiting. 20 tablet 0  . polyethylene glycol (MIRALAX / GLYCOLAX) packet Take 17 g by mouth daily as needed.    Marland Kitchen PREVIDENT 5000 ENAMEL PROTECT 1.1-5 % PSTE   0   No current facility-administered medications on file prior to visit.     Allergies  Allergen Reactions  . Ortho Tri-Cyclen [Norgestimate-Eth Estradiol] Other (See Comments)    Blood clot and PE  . Prednisone Anaphylaxis  . Augmentin [Amoxicillin-Pot Clavulanate] Other (See Comments)    Upsets her stomach      Review of Systems Constitutional: No recent fever/chills/sweats Respiratory: No recent cough/bronchitis Cardiovascular: No chest pain Gastrointestinal: No recent nausea/vomiting/diarrhea, but does note episodes  around time of menses.  Genitourinary: No UTI symptoms.  Positive for pelvic pain, dysmenorrhea, and heavy menses.  Hematologic/lymphatic:No history of coagulopathy or recent blood thinner use    Objective:   Blood pressure (!) 109/55, pulse 78, height 5\' 9"  (1.753 m), weight 154 lb 9.6 oz (70.1 kg), last menstrual period 02/10/2018. CONSTITUTIONAL: Well-developed, well-nourished female in no acute distress.  HENT:  Normocephalic, atraumatic, External right and left ear normal. Oropharynx is clear and moist EYES: Conjunctivae and EOM are normal. Pupils are equal, round, and reactive to light. No scleral icterus.  NECK: Normal range of motion, supple, no masses SKIN: Skin is warm and dry. No rash noted. Not diaphoretic. No erythema. No pallor. NEUROLOGIC: Alert and  oriented to person, place, and time. Normal reflexes, muscle tone coordination. No cranial nerve deficit noted. PSYCHIATRIC: Normal mood and affect. Normal behavior. Normal judgment and thought content. CARDIOVASCULAR: Normal heart rate noted, regular rhythm RESPIRATORY: Effort and breath sounds normal, no problems with respiration noted ABDOMEN: Soft, mildly tender in lower pelvis.  Nondistended. PELVIC: Deferred MUSCULOSKELETAL: Normal range of motion. No edema and no tenderness. 2+ distal pulses.    Labs: Lab Results  Component Value Date   WBC 5.9 12/21/2017   HGB 13.5 12/21/2017   HCT 40.4 12/21/2017   MCV 88.1 12/21/2017   PLT 217 12/21/2017   Lab Results  Component Value Date   CREATININE 0.72 12/21/2017  BUN 15 12/21/2017   NA 138 12/21/2017   K 3.5 12/21/2017   CL 105 12/21/2017   CO2 25 12/21/2017     Imaging Studies: US Transvaginal Non-ob  Result Date: 02/08/2018 ULTRASOUND REPORT Location: [facility] Date of Service:  02/01/2018 Indications: Endometriosis Findings: The uterus measures 8.2 x 4.7 x 4.3 cm. Echo texture is homogeneous without evidence of focal masses. The Endometrium measures 9.3 mm. Right Ovary measures 2.7 x 1.9 x 1.7 cm. It is normal in appearance, however, it appears affixed to the uterus. Left Ovary measures 2.0 x 1.3 x 1.3 cm. It is normal appearance. Survey of the adnexa demonstrates no adnexal masses. There free fluid in the anterior cul de sac. Impression: 1. Anteverted uterus appears of normal size and contour. 2. The endometrium measures 9.3 mm. 3. Bilateral ovaries appear WNL, however, the right ovary appears affixed in place against the uterus. 4. Free fluid noted in the anterior cul de sac. Recommendations: 1.Clinical correlation with the patient's History and Physical Exam. Dario Ave, RDMS The ultrasound images and findings were reviewed by me and I agree with the above report. Finis Bud, M.D. 02/08/2018 2:13 PM     Assessment:   Abdominal-Pelvic pain Menorrhagia with irregular cycle Dyspareunia H/o thromboembolic events (PE and DVT on OCPs)   Plan:    Counseling: Procedure, risks, reasons, benefits and complications (including injury to bowel, bladder, major blood vessel, ureter, bleeding, possibility of transfusion, infection, or fistula formation) reviewed in detail. Likelihood of success in alleviating the patient's condition was discussed. Routine postoperative instructions will be reviewed with the patient and her family in detail after surgery.  The patient concurred with the proposed plan, giving informed written consent for the surgery.   Preop testing ordered. Instructions reviewed, including NPO after midnight.   Rubie Maid, MD [facility]

## 2018-02-22 ENCOUNTER — Other Ambulatory Visit: Payer: Self-pay

## 2018-02-22 ENCOUNTER — Encounter
Admission: RE | Admit: 2018-02-22 | Discharge: 2018-02-22 | Disposition: A | Discharge status: Home / Self Care | Payer: BLUE CROSS/BLUE SHIELD | Source: Ambulatory Visit | Attending: Obstetrics and Gynecology | Admitting: Obstetrics and Gynecology

## 2018-02-22 DIAGNOSIS — Z01818 Encounter for other preprocedural examination: Secondary | ICD-10-CM | POA: Insufficient documentation

## 2018-02-22 LAB — CBC
HCT: 37.7 % (ref 35.0–47.0)
Hemoglobin: 12.9 g/dL (ref 12.0–16.0)
MCH: 30.4 pg (ref 26.0–34.0)
MCHC: 34.4 g/dL (ref 32.0–36.0)
MCV: 88.6 fL (ref 80.0–100.0)
PLATELETS: 220 10*3/uL (ref 150–440)
RBC: 4.25 MIL/uL (ref 3.80–5.20)
RDW: 13.4 % (ref 11.5–14.5)
WBC: 8.6 10*3/uL (ref 3.6–11.0)

## 2018-02-22 NOTE — Patient Instructions (Signed)
Your procedure is scheduled on: Monday 02/26/18 Report to [place]. To find out your arrival time please call [phone] between 1PM - 3PM on Friday 02/23/18.  Remember: Instructions that are not followed completely may result in serious medical risk, up to and including death, or upon the discretion of your surgeon and anesthesiologist your surgery may need to be rescheduled.     _X__ 1. Do not eat food after midnight the night before your procedure.                 No gum chewing or hard candies. You may drink clear liquids up to 2 hours                 before you are scheduled to arrive for your surgery- DO not drink clear                 liquids within 2 hours of the start of your surgery.                 Clear Liquids include:  water, apple juice without pulp, clear carbohydrate                 drink such as Clearfast or Gatorade, Black Coffee or Tea (Do not add                 anything to coffee or tea).  __X__2.  On the morning of surgery brush your teeth with toothpaste and water, you                 may rinse your mouth with mouthwash if you wish.  Do not swallow any              toothpaste of mouthwash.     _X__ 3.  No Alcohol for 24 hours before or after surgery.   _X__ 4.  Do Not Smoke or use e-cigarettes For 24 Hours Prior to Your Surgery.                 Do not use any chewable tobacco products for at least 6 hours prior to                 surgery.  ____  5.  Bring all medications with you on the day of surgery if instructed.   __X__  6.  Notify your doctor if there is any change in your medical condition      (cold, fever, infections).     Do not wear jewelry, make-up, hairpins, clips or nail polish. Do not wear lotions, powders, or perfumes.  Do not shave 48 hours prior to surgery. Men may shave face and neck. Do not bring valuables to the hospital.    [facility] is not responsible for any belongings or  valuables.  Contacts, dentures/partials or body piercings may not be worn into surgery. Bring a case for your contacts, glasses or hearing aids, a denture cup will be supplied. Leave your suitcase in the car. After surgery it may be brought to your room. For patients admitted to the hospital, discharge time is determined by your treatment team.   Patients discharged the day of surgery will not be allowed to drive home.   Please read over the following fact sheets that you were given:   MRSA Information  __X__ Take these medicines the morning of surgery with A SIP OF WATER:  1. DEXILANT  2. LORATADINE  3. MONTELUKAST  4.  5.  6.  ____ Fleet Enema (as directed)   __X__ Use CHG Soap/SAGE wipes as directed  ____ Use inhalers on the day of surgery  ____ Stop metformin/Janumet/Farxiga 2 days prior to surgery    ____ Take 1/2 of usual insulin dose the night before surgery. No insulin the morning          of surgery.   ____ Stop Blood Thinners Coumadin/Plavix/Xarelto/Pleta/Pradaxa/Eliquis/Effient/Aspirin  on   Or contact your Surgeon, Cardiologist or Medical Doctor regarding  ability to stop your blood thinners  __X__ Stop Anti-inflammatories 7 days before surgery such as Advil, Ibuprofen, Motrin,  BC or Goodies Powder, Naprosyn, Naproxen, Aleve, Aspirin    __X__ Stop all herbal supplements, fish oil or vitamin E until after surgery.    ____ Bring C-Pap to the hospital.

## 2018-02-26 ENCOUNTER — Encounter
Admission: RE | Disposition: A | Discharge status: Home / Self Care | Payer: Self-pay | Source: Ambulatory Visit | Attending: Obstetrics and Gynecology

## 2018-02-26 ENCOUNTER — Encounter: Payer: Self-pay | Admitting: *Deleted

## 2018-02-26 ENCOUNTER — Ambulatory Visit: Payer: BLUE CROSS/BLUE SHIELD | Admitting: Registered Nurse

## 2018-02-26 ENCOUNTER — Other Ambulatory Visit: Payer: Self-pay

## 2018-02-26 ENCOUNTER — Ambulatory Visit
Admission: RE | Admit: 2018-02-26 | Discharge: 2018-02-26 | Disposition: A | Discharge status: Home / Self Care | Payer: BLUE CROSS/BLUE SHIELD | Source: Ambulatory Visit | Attending: Obstetrics and Gynecology | Admitting: Obstetrics and Gynecology

## 2018-02-26 DIAGNOSIS — N941 Unspecified dyspareunia: Secondary | ICD-10-CM | POA: Insufficient documentation

## 2018-02-26 DIAGNOSIS — N926 Irregular menstruation, unspecified: Secondary | ICD-10-CM | POA: Diagnosis not present

## 2018-02-26 DIAGNOSIS — N808 Other endometriosis: Secondary | ICD-10-CM | POA: Insufficient documentation

## 2018-02-26 DIAGNOSIS — Z86718 Personal history of other venous thrombosis and embolism: Secondary | ICD-10-CM | POA: Diagnosis not present

## 2018-02-26 DIAGNOSIS — Z85820 Personal history of malignant melanoma of skin: Secondary | ICD-10-CM | POA: Diagnosis not present

## 2018-02-26 DIAGNOSIS — Z79899 Other long term (current) drug therapy: Secondary | ICD-10-CM | POA: Diagnosis not present

## 2018-02-26 DIAGNOSIS — N800 Endometriosis of uterus: Secondary | ICD-10-CM | POA: Diagnosis not present

## 2018-02-26 DIAGNOSIS — M797 Fibromyalgia: Secondary | ICD-10-CM | POA: Diagnosis not present

## 2018-02-26 DIAGNOSIS — N803 Endometriosis of pelvic peritoneum: Secondary | ICD-10-CM | POA: Diagnosis not present

## 2018-02-26 DIAGNOSIS — N920 Excessive and frequent menstruation with regular cycle: Secondary | ICD-10-CM | POA: Diagnosis not present

## 2018-02-26 DIAGNOSIS — K219 Gastro-esophageal reflux disease without esophagitis: Secondary | ICD-10-CM | POA: Diagnosis not present

## 2018-02-26 DIAGNOSIS — Z86711 Personal history of pulmonary embolism: Secondary | ICD-10-CM | POA: Insufficient documentation

## 2018-02-26 DIAGNOSIS — R102 Pelvic and perineal pain: Secondary | ICD-10-CM

## 2018-02-26 DIAGNOSIS — I739 Peripheral vascular disease, unspecified: Secondary | ICD-10-CM | POA: Diagnosis not present

## 2018-02-26 DIAGNOSIS — Z888 Allergy status to other drugs, medicaments and biological substances status: Secondary | ICD-10-CM | POA: Insufficient documentation

## 2018-02-26 DIAGNOSIS — J45909 Unspecified asthma, uncomplicated: Secondary | ICD-10-CM | POA: Insufficient documentation

## 2018-02-26 DIAGNOSIS — N8311 Corpus luteum cyst of right ovary: Secondary | ICD-10-CM | POA: Insufficient documentation

## 2018-02-26 DIAGNOSIS — R1031 Right lower quadrant pain: Secondary | ICD-10-CM

## 2018-02-26 DIAGNOSIS — Z880 Allergy status to penicillin: Secondary | ICD-10-CM | POA: Insufficient documentation

## 2018-02-26 HISTORY — PX: LAPAROSCOPY: SHX197

## 2018-02-26 LAB — POCT PREGNANCY, URINE: PREG TEST UR: NEGATIVE

## 2018-02-26 SURGERY — LAPAROSCOPY, DIAGNOSTIC
Anesthesia: General | Site: Abdomen

## 2018-02-26 MED ORDER — DEXAMETHASONE SODIUM PHOSPHATE 10 MG/ML IJ SOLN
INTRAMUSCULAR | Status: DC | PRN
  Administered 2018-02-26: 10 mg via INTRAVENOUS

## 2018-02-26 MED ORDER — FENTANYL CITRATE (PF) 100 MCG/2ML IJ SOLN
INTRAMUSCULAR | Status: DC | PRN
  Administered 2018-02-26 (×3): 50 ug via INTRAVENOUS

## 2018-02-26 MED ORDER — MIDAZOLAM HCL 2 MG/2ML IJ SOLN
INTRAMUSCULAR | Status: AC
  Filled 2018-02-26: qty 2

## 2018-02-26 MED ORDER — SIMETHICONE 80 MG PO CHEW: 80.0000 mg | CHEWABLE_TABLET | Freq: Four times a day (QID) | ORAL | 1 refills | Status: DC | PRN

## 2018-02-26 MED ORDER — SCOPOLAMINE 1 MG/3DAYS TD PT72
1.0000 | MEDICATED_PATCH | Freq: Once | TRANSDERMAL | Status: DC
  Administered 2018-02-26: 1.5 mg via TRANSDERMAL

## 2018-02-26 MED ORDER — EPHEDRINE SULFATE 50 MG/ML IJ SOLN
INTRAMUSCULAR | Status: DC | PRN
  Administered 2018-02-26: 10 mg via INTRAVENOUS

## 2018-02-26 MED ORDER — SUGAMMADEX SODIUM 500 MG/5ML IV SOLN
INTRAVENOUS | Status: DC | PRN
  Administered 2018-02-26: 300 mg via INTRAVENOUS

## 2018-02-26 MED ORDER — ROCURONIUM BROMIDE 50 MG/5ML IV SOLN
INTRAVENOUS | Status: AC
  Filled 2018-02-26: qty 1

## 2018-02-26 MED ORDER — ONDANSETRON HCL 4 MG/2ML IJ SOLN
INTRAMUSCULAR | Status: AC
  Filled 2018-02-26: qty 2

## 2018-02-26 MED ORDER — MIDAZOLAM HCL 2 MG/2ML IJ SOLN
INTRAMUSCULAR | Status: DC | PRN
  Administered 2018-02-26: 2 mg via INTRAVENOUS

## 2018-02-26 MED ORDER — PROMETHAZINE HCL 25 MG/ML IJ SOLN
6.2500 mg | INTRAMUSCULAR | Status: DC | PRN
  Administered 2018-02-26: 12.5 mg via INTRAVENOUS

## 2018-02-26 MED ORDER — FENTANYL CITRATE (PF) 100 MCG/2ML IJ SOLN
INTRAMUSCULAR | Status: DC
Start: 2018-02-26 — End: 2018-02-26
  Filled 2018-02-26: qty 2

## 2018-02-26 MED ORDER — BUPIVACAINE HCL (PF) 0.5 % IJ SOLN
INTRAMUSCULAR | Status: AC
  Filled 2018-02-26: qty 30

## 2018-02-26 MED ORDER — ONDANSETRON HCL 4 MG/2ML IJ SOLN
INTRAMUSCULAR | Status: DC | PRN
  Administered 2018-02-26 (×2): 4 mg via INTRAVENOUS

## 2018-02-26 MED ORDER — FENTANYL CITRATE (PF) 100 MCG/2ML IJ SOLN
INTRAMUSCULAR | Status: AC
  Filled 2018-02-26: qty 4

## 2018-02-26 MED ORDER — SUGAMMADEX SODIUM 200 MG/2ML IV SOLN
INTRAVENOUS | Status: AC
  Filled 2018-02-26: qty 2

## 2018-02-26 MED ORDER — LIDOCAINE HCL (PF) 2 % IJ SOLN
INTRAMUSCULAR | Status: AC
  Filled 2018-02-26: qty 10

## 2018-02-26 MED ORDER — KETOROLAC TROMETHAMINE 30 MG/ML IJ SOLN
INTRAMUSCULAR | Status: AC
  Filled 2018-02-26: qty 1

## 2018-02-26 MED ORDER — PROMETHAZINE HCL 25 MG/ML IJ SOLN
INTRAMUSCULAR | Status: AC
  Filled 2018-02-26: qty 1

## 2018-02-26 MED ORDER — PROPOFOL 10 MG/ML IV BOLUS
INTRAVENOUS | Status: AC
  Filled 2018-02-26: qty 20

## 2018-02-26 MED ORDER — PROPOFOL 10 MG/ML IV BOLUS
INTRAVENOUS | Status: DC | PRN
  Administered 2018-02-26: 150 mg via INTRAVENOUS

## 2018-02-26 MED ORDER — IBUPROFEN 800 MG PO TABS: 800.0000 mg | ORAL_TABLET | Freq: Three times a day (TID) | ORAL | 1 refills | Status: DC | PRN

## 2018-02-26 MED ORDER — OXYCODONE-ACETAMINOPHEN 5-325 MG PO TABS: 1.0000 | ORAL_TABLET | Freq: Four times a day (QID) | ORAL | 0 refills | Status: DC | PRN

## 2018-02-26 MED ORDER — DEXAMETHASONE SODIUM PHOSPHATE 10 MG/ML IJ SOLN
INTRAMUSCULAR | Status: AC
  Filled 2018-02-26: qty 1

## 2018-02-26 MED ORDER — LIDOCAINE HCL (CARDIAC) PF 100 MG/5ML IV SOSY
PREFILLED_SYRINGE | INTRAVENOUS | Status: DC | PRN
  Administered 2018-02-26: 100 mg via INTRAVENOUS

## 2018-02-26 MED ORDER — SODIUM CHLORIDE FLUSH 0.9 % IV SOLN
INTRAVENOUS | Status: AC
  Filled 2018-02-26: qty 10

## 2018-02-26 MED ORDER — ACETAMINOPHEN NICU IV SYRINGE 10 MG/ML
INTRAVENOUS | Status: AC
  Filled 2018-02-26: qty 1

## 2018-02-26 MED ORDER — ROCURONIUM BROMIDE 100 MG/10ML IV SOLN
INTRAVENOUS | Status: DC | PRN
  Administered 2018-02-26: 40 mg via INTRAVENOUS
  Administered 2018-02-26: 10 mg via INTRAVENOUS

## 2018-02-26 MED ORDER — BUPIVACAINE HCL 0.5 % IJ SOLN
INTRAMUSCULAR | Status: DC | PRN
  Administered 2018-02-26: 10 mL

## 2018-02-26 MED ORDER — SUGAMMADEX SODIUM 500 MG/5ML IV SOLN
INTRAVENOUS | Status: AC
  Filled 2018-02-26: qty 5

## 2018-02-26 MED ORDER — FENTANYL CITRATE (PF) 100 MCG/2ML IJ SOLN
25.0000 ug | INTRAMUSCULAR | Status: DC | PRN
  Administered 2018-02-26 (×2): 50 ug via INTRAVENOUS

## 2018-02-26 MED ORDER — LACTATED RINGERS IV SOLN
INTRAVENOUS | Status: DC
  Administered 2018-02-26: 09:00:00 via INTRAVENOUS

## 2018-02-26 MED ORDER — ACETAMINOPHEN 10 MG/ML IV SOLN
INTRAVENOUS | Status: DC | PRN
  Administered 2018-02-26: 1000 mg via INTRAVENOUS

## 2018-02-26 MED ORDER — SCOPOLAMINE 1 MG/3DAYS TD PT72
MEDICATED_PATCH | TRANSDERMAL | Status: AC
  Administered 2018-02-26: 1.5 mg via TRANSDERMAL
  Filled 2018-02-26: qty 1

## 2018-02-26 SURGICAL SUPPLY — 35 items
BLADE SURG SZ11 CARB STEEL (BLADE) ×2 IMPLANT
CANISTER SUCT 1200ML W/VALVE (MISCELLANEOUS) ×2 IMPLANT
CATH ROBINSON RED A/P 16FR (CATHETERS) ×2 IMPLANT
CHLORAPREP W/TINT 26ML (MISCELLANEOUS) ×2 IMPLANT
CORD MONOPOLAR M/FML 12FT (MISCELLANEOUS) ×1 IMPLANT
DERMABOND ADVANCED (GAUZE/BANDAGES/DRESSINGS) ×1
DERMABOND ADVANCED .7 DNX12 (GAUZE/BANDAGES/DRESSINGS) ×1 IMPLANT
GLOVE BIO SURGEON STRL SZ 6.5 (GLOVE) ×2 IMPLANT
GLOVE BIO SURGEON STRL SZ8 (GLOVE) ×2 IMPLANT
GLOVE INDICATOR 7.0 STRL GRN (GLOVE) ×2 IMPLANT
GLOVE INDICATOR 8.0 STRL GRN (GLOVE) ×2 IMPLANT
GOWN STRL REUS W/ TWL LRG LVL3 (GOWN DISPOSABLE) ×2 IMPLANT
GOWN STRL REUS W/TWL LRG LVL3 (GOWN DISPOSABLE) ×2
GOWN STRL REUS W/TWL XL LVL4 (GOWN DISPOSABLE) ×2 IMPLANT
IRRIGATION STRYKERFLOW (MISCELLANEOUS) ×1 IMPLANT
IRRIGATOR STRYKERFLOW (MISCELLANEOUS) ×2
IV LACTATED RINGERS 1000ML (IV SOLUTION) ×2 IMPLANT
KIT PINK PAD W/HEAD ARE REST (MISCELLANEOUS) ×2
KIT PINK PAD W/HEAD ARM REST (MISCELLANEOUS) ×1 IMPLANT
KIT TURNOVER CYSTO (KITS) ×2 IMPLANT
NS IRRIG 500ML POUR BTL (IV SOLUTION) ×2 IMPLANT
PACK GYN LAPAROSCOPIC (MISCELLANEOUS) ×2 IMPLANT
PAD OB MATERNITY 4.3X12.25 (PERSONAL CARE ITEMS) ×2 IMPLANT
PAD PREP 24X41 OB/GYN DISP (PERSONAL CARE ITEMS) ×2 IMPLANT
POUCH ENDO CATCH 10MM SPEC (MISCELLANEOUS) IMPLANT
SCISSORS METZENBAUM CVD 33 (INSTRUMENTS) IMPLANT
SHEARS HARMONIC ACE PLUS 36CM (ENDOMECHANICALS) IMPLANT
SLEEVE ENDOPATH XCEL 5M (ENDOMECHANICALS) ×3 IMPLANT
SUT VIC AB 3-0 SH 27 (SUTURE)
SUT VIC AB 3-0 SH 27X BRD (SUTURE) IMPLANT
SUT VICRYL 0 AB UR-6 (SUTURE) ×2 IMPLANT
TROCAR ENDO BLADELESS 11MM (ENDOMECHANICALS) ×2 IMPLANT
TROCAR XCEL NON-BLD 5MMX100MML (ENDOMECHANICALS) ×2 IMPLANT
TROCAR XCEL UNIV SLVE 11M 100M (ENDOMECHANICALS) ×2 IMPLANT
TUBING INSUFFLATION (TUBING) ×2 IMPLANT

## 2018-02-26 NOTE — Anesthesia Preprocedure Evaluation (Signed)
Anesthesia Evaluation  Patient identified by MRN, date of birth, ID band Patient awake    Reviewed: Allergy & Precautions, H&P , NPO status , Patient's Chart, lab work & pertinent test results, reviewed documented beta blocker date and time   History of Anesthesia Complications (+) PONV and history of anesthetic complications  Airway Mallampati: II  TM Distance: >3 FB Neck ROM: full    Dental  (+) Dental Advidsory Given, Teeth Intact Permanent retainer on the bottom:   Pulmonary neg shortness of breath, asthma , neg sleep apnea, neg recent URI,           Cardiovascular Exercise Tolerance: Good (-) hypertension(-) angina+ Peripheral Vascular Disease  (-) CAD, (-) Past MI, (-) Cardiac Stents and (-) CABG (-) dysrhythmias (-) Valvular Problems/Murmurs     Neuro/Psych PSYCHIATRIC DISORDERS (fibromyalgia) negative neurological ROS     GI/Hepatic Neg liver ROS, GERD  ,  Endo/Other  negative endocrine ROS  Renal/GU negative Renal ROS  negative genitourinary   Musculoskeletal   Abdominal   Peds  Hematology negative hematology ROS (+)   Anesthesia Other Findings Past Medical History: No date: Chronic sinusitis No date: Fibromyalgia No date: GERD (gastroesophageal reflux disease) No date: Headache     Comment:  sinus 11/22/2017: History of melanoma 2018: History of melanoma     Comment:  Back 11/30/2015: May-Thurner syndrome     Comment:  January 2017; 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[facility]  No date: PONV (postoperative nausea and vomiting) No date: Pulmonary embolism (HCC)   Reproductive/Obstetrics negative OB ROS                             Anesthesia Physical  Anesthesia Plan  ASA: II  Anesthesia Plan: General   Post-op Pain Management:    Induction: Intravenous  PONV Risk Score and Plan: 4 or greater and Ondansetron and Dexamethasone  Airway Management Planned: Oral ETT  Additional Equipment:    Intra-op Plan:   Post-operative Plan: Extubation in OR  Informed Consent: I have reviewed the patients History and Physical, chart, labs and discussed the procedure including the risks, benefits and alternatives for the proposed anesthesia with the patient or authorized representative who has indicated his/her understanding and acceptance.   Dental Advisory Given  Plan Discussed with: Anesthesiologist, CRNA and Surgeon  Anesthesia Plan Comments:         Anesthesia Quick Evaluation

## 2018-02-26 NOTE — Anesthesia Post-op Follow-up Note (Signed)
Anesthesia QCDR form completed.        

## 2018-02-26 NOTE — Anesthesia Postprocedure Evaluation (Signed)
Anesthesia Post Note  Patient: Ana Knapp  Procedure(s) Performed: LAPAROSCOPY DIAGNOSTIC WITH BIOPSIES (N/A Abdomen)  Patient location during evaluation: PACU Anesthesia Type: General Level of consciousness: awake and alert Pain management: pain level controlled Vital Signs Assessment: post-procedure vital signs reviewed and stable Respiratory status: spontaneous breathing, nonlabored ventilation, respiratory function stable and patient connected to nasal cannula oxygen Cardiovascular status: blood pressure returned to baseline and stable Postop Assessment: no apparent nausea or vomiting Anesthetic complications: no     Last Vitals:  Vitals:   02/26/18 1149 02/26/18 1156  BP:  115/62  Pulse: 94 96  Resp: 16 20  Temp: 37.1 C 36.9 C  SpO2: 100% 99%    Last Pain:  Vitals:   02/26/18 1156  TempSrc:   PainSc: 2                  Martha Clan

## 2018-02-26 NOTE — Transfer of Care (Signed)
Immediate Anesthesia Transfer of Care Note  Patient: Ana Knapp  Procedure(s) Performed: Procedure(s): LAPAROSCOPY DIAGNOSTIC WITH BIOPSIES (N/A)  Patient Location: PACU  Anesthesia Type:General  Level of Consciousness: sedated  Airway & Oxygen Therapy: Patient Spontanous Breathing and Patient connected to face mask oxygen  Post-op Assessment: Report given to RN and Post -op Vital signs reviewed and stable  Post vital signs: Reviewed and stable  Last Vitals:  Vitals:   02/26/18 1110 02/26/18 1111  BP:  (!) 110/54  Pulse:  (!) 101  Resp:  10  Temp: 37.1 C 37.1 C  SpO2:  229%    Complications: No apparent anesthesia complications

## 2018-02-26 NOTE — OR Nursing (Signed)
Discharge instructions discussed with pt and family. All voice understanding. 

## 2018-02-26 NOTE — Discharge Instructions (Addendum)
Diagnostic Laparoscopy, Care After °Refer to this sheet in the next few weeks. These instructions provide you with information about caring for yourself after your procedure. Your health care provider may also give you more specific instructions. Your treatment has been planned according to current medical practices, but problems sometimes occur. Call your health care provider if you have any problems or questions after your procedure. °What can I expect after the procedure? °After your procedure, it is common to have mild discomfort in the throat and abdomen. °Follow these instructions at home: °· Take over-the-counter and prescription medicines only as told by your health care provider. °· Do not drive for 24 hours if you received a sedative. °· Return to your normal activities as told by your health care provider. °· Do not take baths, swim, or use a hot tub until your health care provider approves. You may shower. °· Follow instructions from your health care provider about how to take care of your incision. Make sure you: °? Wash your hands with soap and water before you change your bandage (dressing). If soap and water are not available, use hand sanitizer. °? Change your dressing as told by your health care provider. °? Leave stitches (sutures), skin glue, or adhesive strips in place. These skin closures may need to stay in place for 2 weeks or longer. If adhesive strip edges start to loosen and curl up, you may trim the loose edges. Do not remove adhesive strips completely unless your health care provider tells you to do that. °· Check your incision area every day for signs of infection. Check for: °? More redness, swelling, or pain. °? More fluid or blood. °? Warmth. °? Pus or a bad smell. °· It is your responsibility to get the results of your procedure. Ask your health care provider or the department performing the procedure when your results will be ready. °Contact a health care provider if: °· There is  new pain in your shoulders. °· You feel light-headed or faint. °· You are unable to pass gas or unable to have a bowel movement. °· You feel nauseous or you vomit. °· You develop a rash. °· You have more redness, swelling, or pain around your incision. °· You have more fluid or blood coming from your incision. °· Your incision feels warm to the touch. °· You have pus or a bad smell coming from your incision. °· You have a fever or chills. °Get help right away if: °· Your pain is getting worse. °· You have ongoing vomiting. °· The edges of your incision open up. °· You have trouble breathing. °· You have chest pain. °This information is not intended to replace advice given to you by your health care provider. Make sure you discuss any questions you have with your health care provider. °Document Released: 09/21/2015 Document Revised: 03/17/2016 Document Reviewed: 06/23/2015 °Elsevier Interactive Patient Education © 2018 [employer] ° °AMBULATORY SURGERY  °DISCHARGE INSTRUCTIONS ° ° °1) The drugs that you were given will stay in your system until tomorrow so for the next 24 hours you should not: ° °A) Drive an automobile °B) Make any legal decisions °C) Drink any alcoholic beverage ° ° °2) You may resume regular meals tomorrow.  Today it is better to start with liquids and gradually work up to solid foods. ° °You may eat anything you prefer, but it is better to start with liquids, then soup and crackers, and gradually work up to solid foods. ° ° °3)   Please notify your doctor immediately if you have any unusual bleeding, trouble breathing, redness and pain at the surgery site, drainage, fever, or pain not relieved by medication. ° ° ° °4) Additional Instructions: ° ° ° ° ° ° ° °Please contact your physician with any problems or Same Day Surgery at [phone], Monday through Friday 6 am to 4 pm, or [place] [facility] Main number at [phone]. ° °

## 2018-02-26 NOTE — Anesthesia Procedure Notes (Signed)
Procedure Name: Intubation Date/Time: 02/26/2018 9:32 AM Performed by: Doreen Salvage, CRNA Pre-anesthesia Checklist: Patient identified, Patient being monitored, Timeout performed, Emergency Drugs available and Suction available Patient Re-evaluated:Patient Re-evaluated prior to induction Oxygen Delivery Method: Circle system utilized Preoxygenation: Pre-oxygenation with 100% oxygen Induction Type: IV induction Ventilation: Mask ventilation without difficulty Laryngoscope Size: Mac and 3 Grade View: Grade I Tube type: Oral Tube size: 7.0 mm Number of attempts: 1 Airway Equipment and Method: Stylet Placement Confirmation: ETT inserted through vocal cords under direct vision,  positive ETCO2 and breath sounds checked- equal and bilateral Secured at: 21 cm Tube secured with: Tape Dental Injury: Teeth and Oropharynx as per pre-operative assessment

## 2018-02-26 NOTE — H&P (Signed)
UPDATE TO PREVIOUS HISTORY AND PHYSICAL  The patient has been seen and examined.  H&P is up to date, no changes noted.  Ana Knapp is a 37 y.o. G0P0000 here for surgical assessment of pelvic pain, dyspareunia, menorrhagia with irregular cycles.  Significant preoperative concerns include prior h/o DVT and PE (~ 10 years ago). All questions answered. Patient can proceed to the OR for scheduled procedure.   Rubie Maid, MD 02/26/2018 9:10 AM

## 2018-02-26 NOTE — Op Note (Addendum)
Procedure(s): LAPAROSCOPY DIAGNOSTIC WITH BIOPSIES, ADHESIOLYSIS Procedure Note  Anyelin Mogle female 37 y.o. 02/26/2018  Indications: The patient is a 37 y.o. G0P0000 female with pelvic pain, dyspareunia, menorrhagia with irregular cycles  Pre-operative Diagnosis: Pelvic pain, dyspareunia, menorrhagia with irregular cycles  Post-operative Diagnosis: Same, with moderate abdominal and pelvic adhesions  Surgeon: Rubie Maid, MD  Assistants: Jeannie Fend, MD  Anesthesia: General endotracheal anesthesia  Findings: The uterus was sounded to 8 cm. Hyperemeic posterior cul-de-sac with peritoneal window noted. Thickened uterosacral ligaments bilateral with adhesions and powder burn lesion on left.  Bladder with several white fibrotic regions.  Ovarian fossa clear of lesions bilaterally.  Powder burn lesion on right sidewall peritoneum.  Fallopian tubes appeared dilated at distal ends bilaterally, with hyperemia. Right ovary with small corpus luteal cyst. Left ovary appeared normal.  Small thick adhesions of bowel to left pelvic sidewall.  Denser adhesions of bowel and omentum to right pelvic sidewall extending superiorly towards the liver.  Liver with few filmy adhesions as well, suggestive of Fitz-Hugh-Curtis syndrome.  Procedure Details: The patient was seen in the Holding Room. The risks, benefits, complications, treatment options, and expected outcomes were discussed with the patient.  The patient concurred with the proposed plan, giving informed consent.  The site of surgery properly noted/marked. The patient was taken to the Operating Room, identified as Sharin Altidor and the procedure verified as Procedure(s) (LRB): LAPAROSCOPY DIAGNOSTIC WITH BIOPSIES (N/A). A Time Out was held and the above information confirmed.  She was then placed under general anesthesia without difficulty. She was placed in the dorsal lithotomy position, and was prepped and draped in a sterile manner.   A straight catheterization was performed. A sterile speculum was inserted into the vagina and the cervix was grasped at the anterior lip using a single-toothed tenaculum.  The uterus was sounded to 8 cm, and a Hulka clamp was placed for uterine manipulation.  The speculum and tenaculum were then removed. After an adequate timeout was performed, attention was turned to the abdomen where an umbilical incision was made with the scalpel.  The Optiview 5-mm trocar and sleeve were then advanced without difficulty with the laparoscope under direct visualization into the abdomen.  The abdomen was then insufflated with carbon dioxide gas and adequate pneumoperitoneum was obtained. A 5-mm left lower quadrant port and a 5-mm right lower quadrant port were then placed under direct visualization.  A survey of the patient's pelvis and abdomen revealed the findings as above.  Biopsy forceps were then used to biopsy segments of the bladder flap, uterosacral ligaments (bilateral), and anterior abdominal wall. The Kleppingers were used to fulgurate all areas where biopsies were taken.  Next, the monopolar scissors were used to perform sharp dissection and adhesiolysis of the bowel and omentum. An atraumatic grasper was also used for blunt dissection of the adhesions.  The  areas noted above were freed from adhesions.  A final survey was performed, where good hemostasis was noted throughout.    All trocars were removed under direct visualization, and the abdomen which was desufflated.    All skin incisions were closed with 4-0 Monocryl subcuticular stitches. A total of 10 cc of 0.5% Sensorcaine was injected into the incisions. Dermabond was placed over the incisions. The patient tolerated the procedures well.  All instruments, needles, and sponge counts were correct x 2. The patient was taken to the recovery room awake, extubated and in stable condition.     Estimated Blood Loss:  Minimal  Drains: straight catheterization  prior to procedure with  200 ml of clear urine         Total IV Fluids: 1000 ml  Specimens: Biopsies of bilateral uterosacral ligaments, bladder flap, right side wall peritoneum         Implants: None         Complications:  None; patient tolerated the procedure well.         Disposition: PACU - hemodynamically stable.         Condition: stable   >50% of total operative time spent performing lysis of adhesions.    Rubie Maid, MD [facility]

## 2018-02-27 LAB — SURGICAL PATHOLOGY

## 2018-03-01 ENCOUNTER — Encounter: Payer: Self-pay | Admitting: Obstetrics and Gynecology

## 2018-03-01 ENCOUNTER — Ambulatory Visit (INDEPENDENT_AMBULATORY_CARE_PROVIDER_SITE_OTHER): Payer: BLUE CROSS/BLUE SHIELD | Admitting: Obstetrics and Gynecology

## 2018-03-01 VITALS — BP 83/45 | HR 73 | Ht 69.0 in | Wt 155.7 lb

## 2018-03-01 DIAGNOSIS — Z4889 Encounter for other specified surgical aftercare: Secondary | ICD-10-CM

## 2018-03-01 DIAGNOSIS — Z9889 Other specified postprocedural states: Secondary | ICD-10-CM

## 2018-03-01 DIAGNOSIS — N809 Endometriosis, unspecified: Secondary | ICD-10-CM

## 2018-03-01 DIAGNOSIS — R102 Pelvic and perineal pain: Secondary | ICD-10-CM

## 2018-03-01 DIAGNOSIS — N979 Female infertility, unspecified: Secondary | ICD-10-CM

## 2018-03-01 NOTE — Progress Notes (Signed)
    OBSTETRICS/GYNECOLOGY POST-OPERATIVE CLINIC VISIT  Subjective:     Anastazja Isaac is a 37 y.o. female who presents to the clinic 3 days status post operative laparoscopy with biopsies and adhesiolysis for abnormal uterine bleeding, pelvic pain and pelvic adhesions. She desired to have post-operative visit early as she cannot take any more time from work after next week. Eating a regular diet without difficulty. Bowel movements are normal. Pain is controlled with current analgesics. Medications being used: prescription NSAID's including ibuprofen (Motrin) and narcotic analgesics including oxycodone/acetaminophen (Percocet, Tylox).  The following portions of the patient's history were reviewed and updated as appropriate: allergies, current medications, past family history, past medical history, past social history, past surgical history and problem list.  Review of Systems Pertinent items noted in HPI and remainder of comprehensive ROS otherwise negative.    Objective:    BP (!) 83/45   Pulse 73   Ht '5\' 9"'$  (1.753 m)   Wt 155 lb 11.2 oz (70.6 kg)   LMP 02/10/2018   BMI 22.99 kg/m  General:  alert and no distress  Abdomen: soft, bowel sounds active, non-tender  Incisions:   healing well, no drainage, no erythema, no hernia, no seroma, no swelling, no dehiscence, incision well approximated    Pathology:  A. BLADDER FLAP; BIOPSY:  - ENDOMETRIOSIS.   BMargarita Sermons; BIOPSY:  - ENDOMETRIOSIS.   C. PERITONEUM; BIOPSY:  - MESOTHELIAL TISSUE, DEFINITE ENDOMETRIOSIS NOT IDENTIFIED   Assessment:    Doing well postoperatively. S/p operative laparoscopy and adhesiolysis Endometriosis Pelvic pain Infertility   Plan:   1. Continue any current medications. 2. Wound care discussed. 3. Operative findings again reviewed. Pathology report discussed. Newly diagnosed endometriosis. Discussed diagnosis.  4. Infertility - patient concerned now that she has a true diagnosis of  endometriosis how this will affect her fertility. Reiterated that endometriosis along with her age may cause it to be a little more difficult to become pregnant, however not impossible.  Discussed assessing symptoms after next menstrual cycle.  Patient and husband have been intermittently trying over the past 10 years. Will have husband complete semen analysis. Patient to begin period tracking on app, and perform fertility kit testing for next cycle.  Discussed if need for ovulation induction, to f/u in clinic if no ovulation occurs.  5. Activity restrictions: no bending, stooping, or squatting and no lifting more than 15 pounds 6. Anticipated return to work: patient desires to return to work next week.  Work notice given but advised on limiting heavy lifting for an additional week. . 7. Follow up: after next menstrual cycle if no ovulation occurs.     Rubie Maid, MD [facility]

## 2018-03-01 NOTE — Progress Notes (Signed)
Pt is present today for post op visit from surgery. Pt's incisions look well and are healing properly. No other concerns.

## 2018-03-05 ENCOUNTER — Encounter: Payer: BLUE CROSS/BLUE SHIELD | Admitting: Obstetrics and Gynecology

## 2018-03-05 ENCOUNTER — Telehealth: Payer: Self-pay | Admitting: *Deleted

## 2018-03-05 NOTE — Telephone Encounter (Signed)
Ana Knapp called at 8:30 to ask if her "return to work" information from Thursday, May 9, could be updated as Dr. Marcelline Mates told her not to lift over 15 pounds, but her paperwork states "No Restrictions"   Can she get an updated form sent to her?   Please advise.

## 2018-03-05 NOTE — Telephone Encounter (Signed)
Pt called back and we discussed her limitations that needed to be addressed on her work excuse letter.

## 2018-03-05 NOTE — Telephone Encounter (Signed)
-----   Message from Drucie Ip sent at 03/05/2018  8:29 AM EDT ----- Regarding: Patient return to work request Ana Knapp called at 8:30 to ask if her "return to work" information from Thursday, May 9, could be updated as Dr. Marcelline Mates told her not to lift over 15 pounds, but her paperwork states "No Restrictions"  Can she get an updated form sent to her?  Please advise.  Thanks [place]

## 2018-03-05 NOTE — Telephone Encounter (Signed)
Pt was called to see what other restrictions that [facility] gave her before typing her letter. Pt did not answer left message to call the office for that information.

## 2018-03-09 ENCOUNTER — Encounter: Payer: Self-pay | Admitting: Obstetrics and Gynecology

## 2018-03-12 ENCOUNTER — Other Ambulatory Visit: Payer: Self-pay

## 2018-03-12 ENCOUNTER — Telehealth: Payer: Self-pay | Admitting: Gastroenterology

## 2018-03-12 NOTE — Telephone Encounter (Signed)
Patient LVM that her insurance is ending soon and would like to go ahead and get a refill on her [place]. Please call her.

## 2018-03-13 ENCOUNTER — Other Ambulatory Visit: Payer: Self-pay

## 2018-03-13 MED ORDER — LUBIPROSTONE 8 MCG PO CAPS: 8.0000 ug | ORAL_CAPSULE | Freq: Two times a day (BID) | ORAL | 1 refills | Status: AC

## 2018-03-13 NOTE — Telephone Encounter (Signed)
Left vm letting pt know I have sent her refill to [facility].

## 2018-03-20 ENCOUNTER — Encounter: Payer: Self-pay | Admitting: Obstetrics and Gynecology

## 2018-03-28 ENCOUNTER — Encounter: Payer: Self-pay | Admitting: Obstetrics and Gynecology

## 2018-04-03 ENCOUNTER — Encounter: Payer: Self-pay | Admitting: Family Medicine

## 2018-04-04 MED ORDER — ONDANSETRON 4 MG PO TBDP: 4.0000 mg | ORAL_TABLET | Freq: Three times a day (TID) | ORAL | 4 refills | Status: DC | PRN

## 2018-04-04 MED ORDER — KETOROLAC TROMETHAMINE 10 MG PO TABS: 10.0000 mg | ORAL_TABLET | Freq: Four times a day (QID) | ORAL | 1 refills | Status: DC | PRN

## 2018-04-16 DIAGNOSIS — D225 Melanocytic nevi of trunk: Secondary | ICD-10-CM | POA: Diagnosis not present

## 2018-04-16 DIAGNOSIS — D2262 Melanocytic nevi of left upper limb, including shoulder: Secondary | ICD-10-CM | POA: Diagnosis not present

## 2018-04-16 DIAGNOSIS — D2272 Melanocytic nevi of left lower limb, including hip: Secondary | ICD-10-CM | POA: Diagnosis not present

## 2018-04-16 DIAGNOSIS — D2261 Melanocytic nevi of right upper limb, including shoulder: Secondary | ICD-10-CM | POA: Diagnosis not present

## 2018-04-30 ENCOUNTER — Encounter: Payer: Self-pay | Admitting: Obstetrics and Gynecology

## 2018-05-02 ENCOUNTER — Other Ambulatory Visit: Payer: Self-pay

## 2018-05-02 MED ORDER — ONDANSETRON HCL 4 MG PO TABS: 4.0000 mg | ORAL_TABLET | Freq: Three times a day (TID) | ORAL | 3 refills | Status: DC | PRN

## 2018-08-01 ENCOUNTER — Other Ambulatory Visit: Payer: Self-pay

## 2018-08-01 MED ORDER — DEXLANSOPRAZOLE 60 MG PO CPDR: 60.0000 mg | DELAYED_RELEASE_CAPSULE | Freq: Every day | ORAL | 6 refills | Status: DC

## 2018-08-19 ENCOUNTER — Ambulatory Visit
Admission: EM | Admit: 2018-08-19 | Discharge: 2018-08-19 | Disposition: A | Discharge status: Home / Self Care | Payer: BLUE CROSS/BLUE SHIELD | Attending: Emergency Medicine | Admitting: Emergency Medicine

## 2018-08-19 ENCOUNTER — Other Ambulatory Visit: Payer: Self-pay

## 2018-08-19 DIAGNOSIS — J111 Influenza due to unidentified influenza virus with other respiratory manifestations: Secondary | ICD-10-CM

## 2018-08-19 DIAGNOSIS — J0140 Acute pansinusitis, unspecified: Secondary | ICD-10-CM | POA: Diagnosis not present

## 2018-08-19 DIAGNOSIS — R69 Illness, unspecified: Secondary | ICD-10-CM

## 2018-08-19 LAB — RAPID INFLUENZA A&B ANTIGENS
Influenza A (ARMC): NEGATIVE
Influenza B (ARMC): NEGATIVE

## 2018-08-19 MED ORDER — DOXYCYCLINE HYCLATE 100 MG PO CAPS: 100.0000 mg | ORAL_CAPSULE | Freq: Two times a day (BID) | ORAL | 0 refills | Status: AC

## 2018-08-19 MED ORDER — FLUTICASONE PROPIONATE 50 MCG/ACT NA SUSP: 2.0000 | Freq: Every day | NASAL | 0 refills | Status: DC

## 2018-08-19 MED ORDER — ACETAMINOPHEN 500 MG PO TABS
1000.0000 mg | ORAL_TABLET | Freq: Once | ORAL | Status: AC
  Administered 2018-08-19: 1000 mg via ORAL

## 2018-08-19 MED ORDER — IBUPROFEN 600 MG PO TABS: 600.0000 mg | ORAL_TABLET | Freq: Four times a day (QID) | ORAL | 0 refills | Status: DC | PRN

## 2018-08-19 MED ORDER — OSELTAMIVIR PHOSPHATE 75 MG PO CAPS: 75.0000 mg | ORAL_CAPSULE | Freq: Two times a day (BID) | ORAL | 0 refills | Status: DC

## 2018-08-19 NOTE — Discharge Instructions (Addendum)
doxycycline for the sinus infection, Flonase, Mucinex D, saline nasal irrigation with a Milta Deiters med rinse and distilled water, ibuprofen 600 mg combined with 1 g of Tylenol 3 or 4 times a day.  Tamiflu for your flulike symptoms.  Push plenty of fluids.  Rest for the next several days.

## 2018-08-19 NOTE — ED Triage Notes (Addendum)
Pt states "I feel awful" since waking this a.m. Pain 7/10 "all over". Temp this a.m. At home was 100.6. Has not taken any Tylenol or Ibuprofen this a.m.

## 2018-08-19 NOTE — ED Provider Notes (Signed)
HPI  SUBJECTIVE:  Ana Knapp is a 37 y.o. female who presents with 3 or 4 days of occasional clear, occasionally thick yellow nasal congestion, sinus pain and pressure, upper dental pain.  She reports sore throat secondary to drainage.  She reports bilateral ear pain and a dry cough starting last night.  States that she woke up this morning feeling much worse, with body aches, fevers T-max 100.6.  No change in her hearing, otorrhea, wheezing, chest pain, shortness of breath.  She reports nausea, no vomiting.  No abdominal pain, urinary complaints, rash, ulcers.  No neck stiffness.  She tried Tylenol last night.  No antipyretic in the past 4 to 6 hours.  No alleviating factors.  Her sinuses are worse with bending forward and lying down, she states that she feels overall worse with walking.  She has a past medical history of allergies, PE, DVT, not on any anticoagulants.  Also may Thurner syndrome, endometriosis.  No history of diabetes, hypertension.  She has not yet gotten her flu shot.  IRW:ERXV, Satira Anis, MD LMP: Last month.  She denies the possibility of being pregnant.    Past Medical History:  Diagnosis Date  . Chronic sinusitis   . Fibromyalgia   . GERD (gastroesophageal reflux disease)   . Headache    sinus  . History of melanoma 11/22/2017  . History of melanoma 2018   Back  . May-Thurner syndrome 11/30/2015   January 2017; 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[facility]   . PONV (postoperative nausea and vomiting)   . Pulmonary embolism [facility])     Past Surgical History:  Procedure Laterality Date  . ANGIOPLASTY / STENTING FEMORAL  March 2017  . CHOLECYSTECTOMY    . COLONOSCOPY WITH PROPOFOL N/A 01/02/2018   Procedure: COLONOSCOPY WITH PROPOFOL;  Surgeon: Lucilla Lame, MD;  Location: [facility] ENDOSCOPY;  Service: Endoscopy;  Laterality: N/A;  . ESOPHAGOGASTRODUODENOSCOPY (EGD) WITH PROPOFOL N/A 01/02/2018   Procedure: ESOPHAGOGASTRODUODENOSCOPY (EGD) WITH PROPOFOL;  Surgeon: Lucilla Lame, MD;   Location: [facility] ENDOSCOPY;  Service: Endoscopy;  Laterality: N/A;  . LAPAROSCOPY N/A 02/26/2018   Procedure: LAPAROSCOPY DIAGNOSTIC WITH BIOPSIES;  Surgeon: Rubie Maid, MD;  Location: [facility] ORS;  Service: Gynecology;  Laterality: N/A;  . Lytics Catheter Placement  11/16/15  . MELANOMA EXCISION  2018   Back  . ROBOTIC ASSISTED LAPAROSCOPIC CHOLECYSTECTOMY-SINGLE SITE  12/29/2016    Family History  Problem Relation Age of Onset  . Thyroid disease Mother   . Diabetes Mother   . Cancer Father        bladder  . Hypertension Maternal Grandmother   . Thyroid disease Paternal Grandmother   . Diabetes Paternal Grandmother   . Stroke Paternal Grandmother   . Cancer Paternal Grandfather        skin  . Heart disease Neg Hx   . COPD Neg Hx     Social History   Tobacco Use  . Smoking status: Never Smoker  . Smokeless tobacco: Never Used  Substance Use Topics  . Alcohol use: Not Currently    Comment: rare  . Drug use: No    No current facility-administered medications for this encounter.   Current Outpatient Medications:  .  lubiprostone (AMITIZA) 8 MCG capsule, Take 8 mcg by mouth 2 (two) times daily with a meal., Disp: , Rfl:  .  dexlansoprazole (DEXILANT) 60 MG capsule, Take 1 capsule (60 mg total) by mouth at bedtime., Disp: 30 capsule, Rfl: 6 .  doxycycline (VIBRAMYCIN) 100 MG capsule, Take  1 capsule (100 mg total) by mouth 2 (two) times daily for 7 days., Disp: 14 capsule, Rfl: 0 .  fluticasone (FLONASE) 50 MCG/ACT nasal spray, Place 2 sprays into both nostrils daily., Disp: 16 g, Rfl: 0 .  ibuprofen (ADVIL,MOTRIN) 600 MG tablet, Take 1 tablet (600 mg total) by mouth every 6 (six) hours as needed., Disp: 30 tablet, Rfl: 0 .  loratadine (CLARITIN) 10 MG tablet, Take 10 mg by mouth daily., Disp: , Rfl:  .  montelukast (SINGULAIR) 10 MG tablet, Take 1 tablet (10 mg total) by mouth at bedtime. (Patient taking differently: Take 10 mg by mouth daily. ), Disp: 30 tablet, Rfl: 11 .   Multiple Vitamin (MULTIVITAMIN) tablet, Take 1 tablet by mouth daily., Disp: , Rfl:  .  ondansetron (ZOFRAN) 4 MG tablet, Take 1 tablet (4 mg total) by mouth every 8 (eight) hours as needed for nausea or vomiting., Disp: 30 tablet, Rfl: 3 .  oseltamivir (TAMIFLU) 75 MG capsule, Take 1 capsule (75 mg total) by mouth 2 (two) times daily. X 5 days, Disp: 10 capsule, Rfl: 0 .  polyethylene glycol (MIRALAX / GLYCOLAX) packet, Take 17 g by mouth daily. , Disp: , Rfl:   Allergies  Allergen Reactions  . Ortho Tri-Cyclen [Norgestimate-Eth Estradiol] Other (See Comments)    Blood clot and PE  . Prednisone Anaphylaxis  . Augmentin [Amoxicillin-Pot Clavulanate] Other (See Comments)    Upsets her stomach     ROS  As noted in HPI.   Physical Exam  BP 127/61 (BP Location: Left Arm)   Pulse (!) 127   Temp 100.2 F (37.9 C) (Oral)   Resp 18   Ht 5\' 9"  (1.753 m)   Wt 69.9 kg   LMP 07/21/2018   SpO2 98%   BMI 22.74 kg/m   Constitutional: Well developed, well nourished, no acute distress Eyes: PERRL, EOMI, conjunctiva normal bilaterally HENT: Normocephalic, atraumatic,mucus membranes moist TMs normal bilaterally.  Positive erythematous, swollen turbinates.  No appreciable purulent nasal congestion.  Positive frontal and maxillary sinus tenderness.  Erythematous oropharynx with positive postnasal drip. Neck: Positive shotty cervical lymphadenopathy, no meningismus Respiratory: Clear to auscultation bilaterally, no rales, no wheezing, no rhonchi Cardiovascular: Regular tachycardia no murmurs, no gallops, no rubs GI: Soft, nondistended, normal bowel sounds, nontender, no rebound, no guarding Back: no CVAT skin: No rash, skin intact Musculoskeletal: No edema, no tenderness, no deformities Neurologic: Alert & oriented x 3, CN II-XII grossly intact, no motor deficits, sensation grossly intact Psychiatric: Speech and behavior appropriate   ED Course   Medications  acetaminophen (TYLENOL)  tablet 1,000 mg (1,000 mg Oral Given 08/19/18 1250)    Orders Placed This Encounter  Procedures  . Rapid Influenza A&B Antigens ([facility] only)    Standing Status:   Standing    Number of Occurrences:   1  . Droplet precaution    Standing Status:   Standing    Number of Occurrences:   1   Results for orders placed or performed during the hospital encounter of 08/19/18 (from the past 24 hour(s))  Rapid Influenza A&B Antigens ([place] only)     Status: None   Collection Time: 08/19/18 12:44 PM  Result Value Ref Range   Influenza A ([facility]) NEGATIVE NEGATIVE   Influenza B ([facility]) NEGATIVE NEGATIVE   No results found.  ED Clinical Impression  Influenza-like illness  Acute non-recurrent pansinusitis   ED Assessment/Plan   Rapid flu negative.  She does report worsening of her sinus symptoms, so  this could be a sinusitis.  Will treat with doxycycline, Flonase, Mucinex D, saline nasal irrigation, ibuprofen 600 mg combined with 1 g of Tylenol 3 or 4 times a day.  Work note for tomorrow and Tuesday, she is not working today.  Also sending home with Tamiflu in case this is influenza.  Discussed labs, imaging, MDM, treatment plan, and plan for follow-up with patient Discussed sn/sx that should prompt return to the ED. patient agrees with plan.   Meds ordered this encounter  Medications  . acetaminophen (TYLENOL) tablet 1,000 mg  . ibuprofen (ADVIL,MOTRIN) 600 MG tablet    Sig: Take 1 tablet (600 mg total) by mouth every 6 (six) hours as needed.    Dispense:  30 tablet    Refill:  0  . fluticasone (FLONASE) 50 MCG/ACT nasal spray    Sig: Place 2 sprays into both nostrils daily.    Dispense:  16 g    Refill:  0  . doxycycline (VIBRAMYCIN) 100 MG capsule    Sig: Take 1 capsule (100 mg total) by mouth 2 (two) times daily for 7 days.    Dispense:  14 capsule    Refill:  0  . oseltamivir (TAMIFLU) 75 MG capsule    Sig: Take 1 capsule (75 mg total) by mouth 2 (two) times daily. X 5 days     Dispense:  10 capsule    Refill:  0    *This clinic note was created using Lobbyist. Therefore, there may be occasional mistakes despite careful proofreading.  ?   Melynda Ripple, MD 08/19/18 1337

## 2018-08-30 ENCOUNTER — Other Ambulatory Visit: Payer: Self-pay

## 2018-08-30 MED ORDER — LUBIPROSTONE 8 MCG PO CAPS: 8.0000 ug | ORAL_CAPSULE | Freq: Two times a day (BID) | ORAL | 5 refills | Status: DC

## 2018-09-05 ENCOUNTER — Encounter: Payer: Self-pay | Admitting: Family Medicine

## 2018-11-06 DIAGNOSIS — J45909 Unspecified asthma, uncomplicated: Secondary | ICD-10-CM | POA: Diagnosis not present

## 2018-11-06 DIAGNOSIS — K219 Gastro-esophageal reflux disease without esophagitis: Secondary | ICD-10-CM | POA: Diagnosis not present

## 2018-11-06 DIAGNOSIS — I871 Compression of vein: Secondary | ICD-10-CM | POA: Diagnosis not present

## 2018-11-06 DIAGNOSIS — Z9049 Acquired absence of other specified parts of digestive tract: Secondary | ICD-10-CM | POA: Diagnosis not present

## 2018-11-06 DIAGNOSIS — Z86718 Personal history of other venous thrombosis and embolism: Secondary | ICD-10-CM | POA: Diagnosis not present

## 2018-11-06 DIAGNOSIS — Z823 Family history of stroke: Secondary | ICD-10-CM | POA: Diagnosis not present

## 2018-11-06 DIAGNOSIS — Z8349 Family history of other endocrine, nutritional and metabolic diseases: Secondary | ICD-10-CM | POA: Diagnosis not present

## 2018-11-06 DIAGNOSIS — Z7982 Long term (current) use of aspirin: Secondary | ICD-10-CM | POA: Diagnosis not present

## 2018-11-06 DIAGNOSIS — Z9109 Other allergy status, other than to drugs and biological substances: Secondary | ICD-10-CM | POA: Diagnosis not present

## 2018-11-06 DIAGNOSIS — Z6823 Body mass index (BMI) 23.0-23.9, adult: Secondary | ICD-10-CM | POA: Diagnosis not present

## 2018-11-12 ENCOUNTER — Other Ambulatory Visit: Payer: Self-pay

## 2018-11-12 ENCOUNTER — Telehealth: Payer: Self-pay | Admitting: Gastroenterology

## 2018-11-12 NOTE — Telephone Encounter (Signed)
Pt is calling she states her rx Joselyn Arrow is no longer covered under her insurance and it was really helping her with her symptoms she would like to see if there is another rx that would help her  [phone]

## 2018-11-12 NOTE — Telephone Encounter (Signed)
Pt notified the only medication that is currently covered is Trulance. Samples provided for her to try.

## 2018-12-09 ENCOUNTER — Telehealth: Payer: Self-pay | Admitting: Family Medicine

## 2018-12-10 NOTE — Telephone Encounter (Signed)
It's hard to believe, but it's been more than a year since patient's last visit I'll send in refill but please ask her to schedule a visit in the next month Thank you

## 2018-12-11 NOTE — Telephone Encounter (Signed)
lvm and informed that script has been sent to pharmacy. Also asked pt to return call to schedule appt within the next month.

## 2018-12-12 ENCOUNTER — Telehealth: Payer: Self-pay | Admitting: Gastroenterology

## 2018-12-12 ENCOUNTER — Other Ambulatory Visit: Payer: Self-pay

## 2018-12-12 MED ORDER — PLECANATIDE 3 MG PO TABS: 3.0000 mg | ORAL_TABLET | Freq: Every day | ORAL | 11 refills | Status: DC

## 2018-12-12 NOTE — Telephone Encounter (Signed)
Pt is calling for Ginger regarding rx Trulance she states she has been trying it for 4 weeks and it does not do as good as rx A[facility] please call pt

## 2018-12-12 NOTE — Telephone Encounter (Signed)
Pt notified under her insurance plan, the only medication covered at this time is Trulance. Pt requested a rx to be sent to [facility].

## 2018-12-19 ENCOUNTER — Other Ambulatory Visit: Payer: Self-pay

## 2018-12-19 ENCOUNTER — Ambulatory Visit
Admission: EM | Admit: 2018-12-19 | Discharge: 2018-12-19 | Disposition: A | Discharge status: Home / Self Care | Payer: BLUE CROSS/BLUE SHIELD | Attending: Family Medicine | Admitting: Family Medicine

## 2018-12-19 DIAGNOSIS — J111 Influenza due to unidentified influenza virus with other respiratory manifestations: Secondary | ICD-10-CM

## 2018-12-19 DIAGNOSIS — R69 Illness, unspecified: Secondary | ICD-10-CM | POA: Diagnosis not present

## 2018-12-19 DIAGNOSIS — R5081 Fever presenting with conditions classified elsewhere: Secondary | ICD-10-CM | POA: Diagnosis not present

## 2018-12-19 DIAGNOSIS — R0981 Nasal congestion: Secondary | ICD-10-CM

## 2018-12-19 DIAGNOSIS — R05 Cough: Secondary | ICD-10-CM | POA: Diagnosis not present

## 2018-12-19 MED ORDER — OSELTAMIVIR PHOSPHATE 75 MG PO CAPS: 75.0000 mg | ORAL_CAPSULE | Freq: Two times a day (BID) | ORAL | 0 refills | Status: DC

## 2018-12-19 NOTE — ED Provider Notes (Signed)
[facility] ____________________________________________  Time seen: Approximately 9:53 AM  I have reviewed the triage vital signs and the nursing notes.   HISTORY  Chief Complaint Cough   HPI Ana Knapp is a 38 y.o. female presenting for evaluation of cough and congestion complaints with fever.  States yesterday she had a mild cough, but reports cough has continued into today, and states that she woke up early this morning feeling hot and checked her temperature and it was 101.5.  Accompanying nasal congestion, generalized body aches and fatigue starting today.  Denies sore throat.  Has overall continued to eat and drink well.  Denies known home sick contacts, works at a bank and frequently around sick people.  Did take Tylenol approximately 3 hours ago.  Denies other aggravating or alleviating factors.  States prior to yesterday she felt completely fine.  Denies other complaints.  Denies chest pain, shortness of breath, abdominal pain, extremity edema.  Arnetha Courser, MD: PCP Patient's last menstrual period was 11/22/2018.denies pregnancy.    Past Medical History:  Diagnosis Date  . Chronic sinusitis   . Fibromyalgia   . GERD (gastroesophageal reflux disease)   . Headache    sinus  . History of melanoma 11/22/2017  . History of melanoma 2018   Back  . 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 PONV (postoperative nausea and vomiting)   . Pulmonary embolism [facility])     Patient Active Problem List   Diagnosis Date Noted  . Endometriosis 03/01/2018  . Abnormal CT scan   . Other specified diseases of intestine   . Nausea 11/22/2017  . PONV (postoperative nausea and vomiting) 11/22/2017  . History of melanoma 11/22/2017  . Biliary dyskinesia 12/05/2016  . Constipation 08/18/2016  . Pre-conception counseling 08/18/2016  . Vitamin B12 deficiency 08/18/2016  . Vitamin D deficiency 08/18/2016  . Shortness of breath  06/19/2016  . Abdominal pain 06/15/2016  . Microcytic hypochromic anemia 06/15/2016  . Abnormal weight gain 06/15/2016  . Fatigue 06/15/2016  . Hyperphosphatemia 11/30/2015  . May-Thurner syndrome 11/30/2015  . DVT (deep venous thrombosis) ([place]) 11/13/2015  . History of pulmonary embolism 11/13/2015  . Asthma exacerbation attacks 11/04/2015  . PE (pulmonary thromboembolism) ([place]) 10/25/2015  . Sinus problem 10/13/2015  . Abdominal pain, chronic, epigastric 09/28/2015  . GERD (gastroesophageal reflux disease) 09/28/2015  . Flushing 09/24/2015  . Fibromyalgia 08/30/2015  . Seasonal allergic rhinitis 08/30/2015  . Alopecia 08/30/2015    Past Surgical History:  Procedure Laterality Date  . ANGIOPLASTY / STENTING FEMORAL  March 2017  . CHOLECYSTECTOMY    . COLONOSCOPY WITH PROPOFOL N/A 01/02/2018   Procedure: COLONOSCOPY WITH PROPOFOL;  Surgeon: Lucilla Lame, MD;  Location: [facility] ENDOSCOPY;  Service: Endoscopy;  Laterality: N/A;  . ESOPHAGOGASTRODUODENOSCOPY (EGD) WITH PROPOFOL N/A 01/02/2018   Procedure: ESOPHAGOGASTRODUODENOSCOPY (EGD) WITH PROPOFOL;  Surgeon: Lucilla Lame, MD;  Location: [facility] ENDOSCOPY;  Service: Endoscopy;  Laterality: N/A;  . LAPAROSCOPY N/A 02/26/2018   Procedure: LAPAROSCOPY DIAGNOSTIC WITH BIOPSIES;  Surgeon: Rubie Maid, MD;  Location: [facility] ORS;  Service: Gynecology;  Laterality: N/A;  . Lytics Catheter Placement  11/16/15  . MELANOMA EXCISION  2018   Back  . ROBOTIC ASSISTED LAPAROSCOPIC CHOLECYSTECTOMY-SINGLE SITE  12/29/2016     No current facility-administered medications for this encounter.   Current Outpatient Medications:  .  dexlansoprazole (DEXILANT) 60 MG capsule, Take 1 capsule (60 mg total) by mouth at bedtime., Disp: 30 capsule, Rfl: 6 .  fluticasone (FLONASE) 50 MCG/ACT nasal spray, Place 2 sprays into both nostrils daily., Disp: 16 g, Rfl: 0 .  ibuprofen (ADVIL,MOTRIN) 600 MG tablet, Take 1 tablet (600 mg total) by mouth every 6 (six) hours as  needed., Disp: 30 tablet, Rfl: 0 .  montelukast (SINGULAIR) 10 MG tablet, Take 1 tablet (10 mg total) by mouth daily., Disp: 30 tablet, Rfl: 0 .  ondansetron (ZOFRAN) 4 MG tablet, Take 1 tablet (4 mg total) by mouth every 8 (eight) hours as needed for nausea or vomiting., Disp: 30 tablet, Rfl: 3 .  oseltamivir (TAMIFLU) 75 MG capsule, Take 1 capsule (75 mg total) by mouth every 12 (twelve) hours., Disp: 10 capsule, Rfl: 0 .  Plecanatide (TRULANCE) 3 MG TABS, Take 3 mg by mouth daily., Disp: 30 tablet, Rfl: 11 .  polyethylene glycol (MIRALAX / GLYCOLAX) packet, Take 17 g by mouth daily. , Disp: , Rfl:   Allergies Ortho tri-cyclen [norgestimate-eth estradiol]; Prednisone; and Augmentin [amoxicillin-pot clavulanate]  Family History  Problem Relation Age of Onset  . Thyroid disease Mother   . Diabetes Mother   . Cancer Father        bladder  . Hypertension Maternal Grandmother   . Thyroid disease Paternal Grandmother   . Diabetes Paternal Grandmother   . Stroke Paternal Grandmother   . Cancer Paternal Grandfather        skin  . Heart disease Neg Hx   . COPD Neg Hx     Social History Social History   Tobacco Use  . Smoking status: Never Smoker  . Smokeless tobacco: Never Used  Substance Use Topics  . Alcohol use: Yes    Comment: rare  . Drug use: No    Review of Systems Constitutional: Positive fever ENT: No sore throat.  As above Cardiovascular: Denies chest pain. Respiratory: Denies shortness of breath. Gastrointestinal: No abdominal pain.  Some nausea.  No vomiting or diarrhea. Musculoskeletal: Negative for back pain. Skin: Negative for rash.   ____________________________________________   PHYSICAL EXAM:  VITAL SIGNS: ED Triage Vitals  Enc Vitals Group     BP 12/19/18 0914 118/67     Pulse Rate 12/19/18 0914 (!) 106     Resp 12/19/18 0914 16     Temp 12/19/18 0914 99.1 F (37.3 C)     Temp Source 12/19/18 0914 Oral     SpO2 12/19/18 0914 100 %     Weight  12/19/18 0914 150 lb (68 kg)     Height 12/19/18 0914 5\' 9"  (1.753 m)     Head Circumference --      Peak Flow --      Pain Score 12/19/18 0913 4     Pain Loc --      Pain Edu? --      Excl. in [place]? --     Constitutional: Alert and oriented. Well appearing and in no acute distress. Eyes: Conjunctivae are normal.  Head: Atraumatic.Mild to moderate tenderness to palpation bilateral frontal and nontender maxillary sinuses. No swelling. No erythema.   Ears: no erythema, normal TMs bilaterally.   Nose: nasal congestion.  Mouth/Throat: Mucous membranes are moist.  Oropharynx non-erythematous.No tonsillar swelling or exudate.  Neck: No stridor.  No cervical spine tenderness to palpation. Hematological/Lymphatic/Immunilogical: No cervical lymphadenopathy. Cardiovascular: Normal rate, regular rhythm. Grossly normal heart sounds.  Good peripheral circulation. Respiratory: Normal respiratory effort.  No retractions. No wheezes, rales or rhonchi. Good air movement.  Musculoskeletal:No cervical, thoracic or lumbar tenderness to palpation.  No  lower extremity edema noted bilaterally. Neurologic:  Normal speech and language. NNo gait instability. Skin:  Skin is warm, dry and intact. No rash noted. Psychiatric: Mood and affect are normal. Speech and behavior are normal. ___________________________________________   LABS (all labs ordered are listed, but only abnormal results are displayed)  Labs Reviewed - No data to display ____________________________________________  PROCEDURES Procedures   INITIAL IMPRESSION / ASSESSMENT AND PLAN / ED COURSE  Pertinent labs & imaging results that were available during my care of the patient were reviewed by me and considered in my medical decision making (see chart for details).  Well-appearing patient.  No acute distress.  Suspect influenza.  Discussed treatment options with patient, will treat with Tamiflu.  Encourage rest, fluids, over-the-counter  Tylenol and ibuprofen as needed. Follow-up and return parameters.  Work note given.Discussed indication, risks and benefits of medications with patient.  Discussed follow up with Primary care physician this week. Discussed follow up and return parameters including no resolution or any worsening concerns. Patient verbalized understanding and agreed to plan.   ____________________________________________   FINAL CLINICAL IMPRESSION(S) / ED DIAGNOSES  Final diagnoses:  Influenza-like illness     ED Discharge Orders         Ordered    oseltamivir (TAMIFLU) 75 MG capsule  Every 12 hours     12/19/18 0946           Note: This dictation was prepared with Dragon dictation along with smaller phrase technology. Any transcriptional errors that result from this process are unintentional.         Marylene Land, NP 12/19/18 1027

## 2018-12-19 NOTE — Discharge Instructions (Signed)
Take medication as prescribed. Rest. Drink plenty of fluids. Continue over the counter tylenol and ibuprofen.   Follow up with your primary care physician this week as needed. Return to Urgent care for new or worsening concerns.

## 2018-12-19 NOTE — ED Triage Notes (Addendum)
Pt states she woke up this am with fever and deep cough. (states temp at home 101.5). Feels sinus pressure and head pressure

## 2018-12-20 ENCOUNTER — Other Ambulatory Visit: Payer: Self-pay

## 2018-12-20 ENCOUNTER — Emergency Department
Admission: EM | Admit: 2018-12-20 | Discharge: 2018-12-20 | Disposition: A | Discharge status: Home / Self Care | Payer: BLUE CROSS/BLUE SHIELD | Attending: Emergency Medicine | Admitting: Emergency Medicine

## 2018-12-20 ENCOUNTER — Emergency Department: Payer: BLUE CROSS/BLUE SHIELD

## 2018-12-20 ENCOUNTER — Encounter: Payer: Self-pay | Admitting: Emergency Medicine

## 2018-12-20 DIAGNOSIS — Z79899 Other long term (current) drug therapy: Secondary | ICD-10-CM | POA: Diagnosis not present

## 2018-12-20 DIAGNOSIS — M791 Myalgia, unspecified site: Secondary | ICD-10-CM | POA: Insufficient documentation

## 2018-12-20 DIAGNOSIS — R0789 Other chest pain: Secondary | ICD-10-CM | POA: Diagnosis not present

## 2018-12-20 DIAGNOSIS — J209 Acute bronchitis, unspecified: Secondary | ICD-10-CM

## 2018-12-20 DIAGNOSIS — R5381 Other malaise: Secondary | ICD-10-CM | POA: Insufficient documentation

## 2018-12-20 DIAGNOSIS — R0602 Shortness of breath: Secondary | ICD-10-CM | POA: Diagnosis not present

## 2018-12-20 DIAGNOSIS — J111 Influenza due to unidentified influenza virus with other respiratory manifestations: Secondary | ICD-10-CM | POA: Diagnosis not present

## 2018-12-20 DIAGNOSIS — B349 Viral infection, unspecified: Secondary | ICD-10-CM | POA: Diagnosis not present

## 2018-12-20 DIAGNOSIS — R05 Cough: Secondary | ICD-10-CM | POA: Diagnosis not present

## 2018-12-20 LAB — BASIC METABOLIC PANEL
Anion gap: 7 (ref 5–15)
BUN: 11 mg/dL (ref 6–20)
CALCIUM: 8.7 mg/dL — AB (ref 8.9–10.3)
CO2: 22 mmol/L (ref 22–32)
Chloride: 106 mmol/L (ref 98–111)
Creatinine, Ser: 0.73 mg/dL (ref 0.44–1.00)
GFR calc Af Amer: >60 mL/min (ref >60)
GLUCOSE: 97 mg/dL (ref 70–99)
Potassium: 3.7 mmol/L (ref 3.5–5.1)
Sodium: 135 mmol/L (ref 135–145)

## 2018-12-20 LAB — CBC
HCT: 38.6 % (ref 36.0–46.0)
HEMOGLOBIN: 12.4 g/dL (ref 12.0–15.0)
MCH: 28.6 pg (ref 26.0–34.0)
MCHC: 32.1 g/dL (ref 30.0–36.0)
MCV: 88.9 fL (ref 80.0–100.0)
PLATELETS: 139 10*3/uL — AB (ref 150–400)
RBC: 4.34 MIL/uL (ref 3.87–5.11)
RDW: 12.7 % (ref 11.5–15.5)
WBC: 2.4 10*3/uL — ABNORMAL LOW (ref 4.0–10.5)
nRBC: 0 % (ref 0.0–0.2)

## 2018-12-20 LAB — TROPONIN I

## 2018-12-20 MED ORDER — IBUPROFEN 600 MG PO TABS: 600.0000 mg | ORAL_TABLET | Freq: Four times a day (QID) | ORAL | 0 refills | Status: DC | PRN

## 2018-12-20 MED ORDER — GUAIFENESIN-CODEINE 100-10 MG/5ML PO SOLN: 10.0000 mL | Freq: Four times a day (QID) | ORAL | 0 refills | Status: DC | PRN

## 2018-12-20 MED ORDER — IBUPROFEN 600 MG PO TABS
600.0000 mg | ORAL_TABLET | Freq: Once | ORAL | Status: AC
  Administered 2018-12-20: 600 mg via ORAL
  Filled 2018-12-20: qty 1

## 2018-12-20 MED ORDER — ACETAMINOPHEN 325 MG PO TABS
650.0000 mg | ORAL_TABLET | Freq: Once | ORAL | Status: AC | PRN
  Administered 2018-12-20: 650 mg via ORAL
  Filled 2018-12-20: qty 2

## 2018-12-20 MED ORDER — ALBUTEROL SULFATE (2.5 MG/3ML) 0.083% IN NEBU
2.5000 mg | INHALATION_SOLUTION | Freq: Once | RESPIRATORY_TRACT | Status: AC
  Administered 2018-12-20: 2.5 mg via RESPIRATORY_TRACT
  Filled 2018-12-20: qty 3

## 2018-12-20 MED ORDER — ALBUTEROL SULFATE HFA 108 (90 BASE) MCG/ACT IN AERS: 2.0000 | INHALATION_SPRAY | Freq: Four times a day (QID) | RESPIRATORY_TRACT | 0 refills | Status: DC | PRN

## 2018-12-20 NOTE — ED Provider Notes (Signed)
[facility] Emergency Department Provider Note ____________________________________________   First MD Initiated Contact with Patient 12/20/18 2036     (approximate)  I have reviewed the triage vital signs and the nursing notes.   HISTORY  Chief Complaint Shortness of Breath    HPI Ana Knapp is a 38 y.o. female with PMH as noted below who presents with cough and shortness of breath, gradual onset over the last day but worse today, associated with some lower mid chest pain, and with body aches and malaise.  The patient states that she was seen at urgent care yesterday and diagnosed with influenza and started on Tamiflu.  She states that the shortness of breath, cough, chest pain got worse today.  Past Medical History:  Diagnosis Date  . Chronic sinusitis   . Fibromyalgia   . GERD (gastroesophageal reflux disease)   . Headache    sinus  . History of melanoma 11/22/2017  . History of melanoma 2018   Back  . 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 PONV (postoperative nausea and vomiting)   . Pulmonary embolism [facility])     Patient Active Problem List   Diagnosis Date Noted  . Endometriosis 03/01/2018  . Abnormal CT scan   . Other specified diseases of intestine   . Nausea 11/22/2017  . PONV (postoperative nausea and vomiting) 11/22/2017  . History of melanoma 11/22/2017  . Biliary dyskinesia 12/05/2016  . Constipation 08/18/2016  . Pre-conception counseling 08/18/2016  . Vitamin B12 deficiency 08/18/2016  . Vitamin D deficiency 08/18/2016  . Shortness of breath 06/19/2016  . Abdominal pain 06/15/2016  . Microcytic hypochromic anemia 06/15/2016  . Abnormal weight gain 06/15/2016  . Fatigue 06/15/2016  . Hyperphosphatemia 11/30/2015  . May-Thurner syndrome 11/30/2015  . DVT (deep venous thrombosis) ([place]) 11/13/2015  . History of pulmonary embolism 11/13/2015  . Asthma exacerbation attacks 11/04/2015    . PE (pulmonary thromboembolism) ([place]) 10/25/2015  . Sinus problem 10/13/2015  . Abdominal pain, chronic, epigastric 09/28/2015  . GERD (gastroesophageal reflux disease) 09/28/2015  . Flushing 09/24/2015  . Fibromyalgia 08/30/2015  . Seasonal allergic rhinitis 08/30/2015  . Alopecia 08/30/2015    Past Surgical History:  Procedure Laterality Date  . ANGIOPLASTY / STENTING FEMORAL  March 2017  . CHOLECYSTECTOMY    . COLONOSCOPY WITH PROPOFOL N/A 01/02/2018   Procedure: COLONOSCOPY WITH PROPOFOL;  Surgeon: Lucilla Lame, MD;  [facility] ENDOSCOPY;  Service: Endoscopy;  Laterality: N/A;  . ESOPHAGOGASTRODUODENOSCOPY (EGD) WITH PROPOFOL N/A 01/02/2018   Procedure: ESOPHAGOGASTRODUODENOSCOPY (EGD) WITH PROPOFOL;  Surgeon: Lucilla Lame, MD;  Location: [facility] ENDOSCOPY;  Service: Endoscopy;  Laterality: N/A;  . LAPAROSCOPY N/A 02/26/2018   Procedure: LAPAROSCOPY DIAGNOSTIC WITH BIOPSIES;  Surgeon: Rubie Maid, MD;  Location: [facility] ORS;  Service: Gynecology;  Laterality: N/A;  . Lytics Catheter Placement  11/16/15  . MELANOMA EXCISION  2018   Back  . ROBOTIC ASSISTED LAPAROSCOPIC CHOLECYSTECTOMY-SINGLE SITE  12/29/2016    Prior to Admission medications   Medication Sig Start Date End Date Taking? Authorizing Provider  dexlansoprazole (DEXILANT) 60 MG capsule Take 1 capsule (60 mg total) by mouth at bedtime. 08/01/18   Lucilla Lame, MD  fluticasone (FLONASE) 50 MCG/ACT nasal spray Place 2 sprays into both nostrils daily. 08/19/18   Melynda Ripple, MD  ibuprofen (ADVIL,MOTRIN) 600 MG tablet Take 1 tablet (600 mg total) by mouth every 6 (six) hours as needed. 08/19/18   Melynda Ripple, MD  montelukast Laurine Blazer)  10 MG tablet Take 1 tablet (10 mg total) by mouth daily. 12/10/18   Arnetha Courser, MD  ondansetron (ZOFRAN) 4 MG tablet Take 1 tablet (4 mg total) by mouth every 8 (eight) hours as needed for nausea or vomiting. 05/02/18   Rubie Maid, MD  oseltamivir (TAMIFLU) 75 MG capsule Take  1 capsule (75 mg total) by mouth every 12 (twelve) hours. 12/19/18   Marylene Land, NP  Plecanatide (TRULANCE) 3 MG TABS Take 3 mg by mouth daily. 12/12/18   Lucilla Lame, MD  polyethylene glycol (MIRALAX / Floria Raveling) packet Take 17 g by mouth daily.     [provider]    Allergies Ortho tri-cyclen [norgestimate-eth estradiol]; Prednisone; and Augmentin [amoxicillin-pot clavulanate]  Family History  Problem Relation Age of Onset  . Thyroid disease Mother   . Diabetes Mother   . Cancer Father        bladder  . Hypertension Maternal Grandmother   . Thyroid disease Paternal Grandmother   . Diabetes Paternal Grandmother   . Stroke Paternal Grandmother   . Cancer Paternal Grandfather        skin  . Heart disease Neg Hx   . COPD Neg Hx     Social History Social History   Tobacco Use  . Smoking status: Never Smoker  . Smokeless tobacco: Never Used  Substance Use Topics  . Alcohol use: Yes    Comment: rare  . Drug use: No    Review of Systems  Constitutional: Positive for intermittent fevers. Eyes: No redness. ENT: Positive for nasal congestion. Cardiovascular: Positive for chest pain. Respiratory: Positive for shortness of breath. Gastrointestinal: No vomiting or diarrhea.  Genitourinary: Negative for dysuria.  Musculoskeletal: Negative for back pain. Skin: Negative for rash. Neurological: Positive for headache.   ____________________________________________   PHYSICAL EXAM:  VITAL SIGNS: ED Triage Vitals  Enc Vitals Group     BP 12/20/18 1457 125/64     Pulse Rate 12/20/18 1457 97     Resp 12/20/18 2011 18     Temp 12/20/18 1457 (!) 102 F (38.9 C)     Temp Source 12/20/18 1457 Oral     SpO2 12/20/18 1457 97 %     Weight 12/20/18 1458 150 lb (68 kg)     Height 12/20/18 1458 5\' 9"  (1.753 m)     Head Circumference --      Peak Flow --      Pain Score 12/20/18 1501 5     Pain Loc --      Pain Edu? --      Excl. in [place]? --     Constitutional:  Alert and oriented.  Relatively well appearing and in no acute distress. Eyes: Conjunctivae are normal.  Head: Atraumatic. Nose: No congestion/rhinnorhea. Mouth/Throat: Mucous membranes are moist.   Neck: Normal range of motion.  Cardiovascular: Normal rate, regular rhythm. Grossly normal heart sounds.  Good peripheral circulation. Respiratory: Normal respiratory effort.  No retractions.  Slightly prolonged expiratory phase but no significant wheeze or rales. Gastrointestinal:  No distention.  Genitourinary: No flank tenderness. Musculoskeletal: No lower extremity edema.  No calf or popliteal swelling or tenderness.  Extremities warm and well perfused.  Neurologic:  Normal speech and language. No gross focal neurologic deficits are appreciated.  Skin:  Skin is warm and dry. No rash noted. Psychiatric: Mood and affect are normal. Speech and behavior are normal.  ____________________________________________   LABS (all labs ordered are listed, but only abnormal results are displayed)  Labs Reviewed  BASIC METABOLIC PANEL - Abnormal; Notable for the following components:      Result Value   Calcium 8.7 (*)    All other components within normal limits  CBC - Abnormal; Notable for the following components:   WBC 2.4 (*)    Platelets 139 (*)    All other components within normal limits  TROPONIN I  POC URINE PREG, ED   ____________________________________________  EKG  ED ECG REPORT I, Arta Silence, the attending physician, personally viewed and interpreted this ECG.  Date: 12/20/2018 EKG Time: 1506 Rate: 99 Rhythm: normal sinus rhythm QRS Axis: normal Intervals: normal ST/T Wave abnormalities: normal Narrative Interpretation: no evidence of acute ischemia  ____________________________________________  RADIOLOGY  CXR: No focal infiltrate or other acute abnormality  ____________________________________________   PROCEDURES  Procedure(s) performed:  No  Procedures  Critical Care performed: No ____________________________________________   INITIAL IMPRESSION / ASSESSMENT AND PLAN / ED COURSE  Pertinent labs & imaging results that were available during my care of the patient were reviewed by me and considered in my medical decision making (see chart for details).  38 year old female with PMH as noted above including history of May Thurner syndrome and a PE 2 years ago who presents with cough, shortness of breath, some atypical chest pain, body aches, and malaise as well as intermittent fevers over the last 2 days.  The patient was seen in urgent care yesterday and I was able to review this note in Epic.  She was diagnosed clinically with influenza (although a swab was not sent) and started on Tamiflu.  The patient states that her cough and shortness of breath worsened today.  On exam, the patient is well-appearing and her vital signs are normal except for borderline temperature.  Her lung exam is normal except for slightly prolonged expiratory phase.  The remainder of the exam is unremarkable.  EKG and chest x-ray obtained from triage show no acute findings.  Lab work-up is also unremarkable.  Overall the presentation is consistent with influenza or other viral syndrome with some bronchitis.  The patient has no ACS risk factors and given the duration of the symptoms and the atypical nature of the pain, the single negative troponin is sufficient to rule this out.  Although the patient does have a history of PE, at this time she has no tachycardia or hypoxia, no recent leg pain or swelling to suggest DVT, and her PE was also thought to be precipitated not just by May Thurner syndrome but also by OCPs which she no longer takes.  Clinically my suspicion for pulmonary embolism is extremely low.  Given the acute viral syndrome it is very likely that a d-dimer would be a false positive and not effective to rule out PE.  I would like to avoid  unnecessary radiation in this young patient since she has had quite a few CTs in the past.  I discussed the risks and benefits extensively with the patient and she agrees with not proceeding with PE work-up at this time.  We will give bronchodilators and ibuprofen.  The patient is stable for discharge home and we will continue the same.  The patient states she is allergic to steroids (states they cause shortness of breath) so I will not add this to the regimen.  ----------------------------------------- 9:57 PM on 12/20/2018 -----------------------------------------  The patient is stable for discharge home.  Her vital signs are normal.  Return precautions given, and she expresses understanding. ____________________________________________   FINAL  CLINICAL IMPRESSION(S) / ED DIAGNOSES  Final diagnoses:  Viral syndrome  Acute bronchitis, unspecified organism      NEW MEDICATIONS STARTED DURING THIS VISIT:  New Prescriptions   No medications on file     Note:  This document was prepared using Dragon voice recognition software and may include unintentional dictation errors.    Arta Silence, MD 12/20/18 2157

## 2018-12-20 NOTE — Discharge Instructions (Signed)
Return to the ER for new, worsening, or persistent severe shortness of breath or chest pain, fevers, weakness, vomiting, leg swelling, or any other new or worsening symptoms that concern you.  Take ibuprofen up to every 6 hours as needed for pain and/or fever.  You can use the guaifenesin-codeine for cough, and the albuterol for shortness of breath or chest tightness up to every 4-6 hours.

## 2018-12-20 NOTE — ED Notes (Signed)
Patient discharged to home per MD order. Patient in stable condition, and deemed medically cleared by ED provider for discharge. Discharge instructions reviewed with patient/family using "Teach Back"; verbalized understanding of medication education and administration, and information about follow-up care. Denies further concerns. ° °

## 2018-12-20 NOTE — ED Triage Notes (Signed)
Pt was diagnosed with flu yesterday.  Here today for chest pain and SHOB.  Pain is to lower mid chest.  Reports feels different than yesterday.  Unlabored.  VSS.  Took tylenol at 4 am.  On tamiflu.  No vomiting.  Chest pain present when still even without coughing or moving. Hx of PE, this feels different per pt.

## 2019-01-01 ENCOUNTER — Encounter: Payer: Self-pay | Admitting: Family Medicine

## 2019-01-01 ENCOUNTER — Ambulatory Visit (INDEPENDENT_AMBULATORY_CARE_PROVIDER_SITE_OTHER): Payer: BLUE CROSS/BLUE SHIELD | Admitting: Family Medicine

## 2019-01-01 VITALS — BP 100/62 | HR 94 | Temp 98.6°F | Resp 16 | Ht 69.0 in | Wt 159.0 lb

## 2019-01-01 DIAGNOSIS — J111 Influenza due to unidentified influenza virus with other respiratory manifestations: Secondary | ICD-10-CM | POA: Diagnosis not present

## 2019-01-01 DIAGNOSIS — D696 Thrombocytopenia, unspecified: Secondary | ICD-10-CM | POA: Diagnosis not present

## 2019-01-01 DIAGNOSIS — D709 Neutropenia, unspecified: Secondary | ICD-10-CM

## 2019-01-01 DIAGNOSIS — N809 Endometriosis, unspecified: Secondary | ICD-10-CM

## 2019-01-01 DIAGNOSIS — J301 Allergic rhinitis due to pollen: Secondary | ICD-10-CM

## 2019-01-01 DIAGNOSIS — R05 Cough: Secondary | ICD-10-CM

## 2019-01-01 DIAGNOSIS — R059 Cough, unspecified: Secondary | ICD-10-CM

## 2019-01-01 LAB — BASIC METABOLIC PANEL WITH GFR
BUN: 12 mg/dL (ref 7–25)
CALCIUM: 9.9 mg/dL (ref 8.6–10.2)
CO2: 27 mmol/L (ref 20–32)
CREATININE: 0.75 mg/dL (ref 0.50–1.10)
Chloride: 106 mmol/L (ref 98–110)
GFR, EST NON AFRICAN AMERICAN: 100 mL/min/{1.73_m2} (ref >=60)
GFR, Est African American: 116 mL/min/{1.73_m2} (ref >=60)
GLUCOSE: 79 mg/dL (ref 65–99)
Potassium: 4.5 mmol/L (ref 3.5–5.3)
Sodium: 139 mmol/L (ref 135–146)

## 2019-01-01 LAB — CBC WITH DIFFERENTIAL/PLATELET
Absolute Monocytes: 470 cells/uL (ref 200–950)
BASOS ABS: 39 {cells}/uL (ref 0–200)
BASOS PCT: 0.7 %
Eosinophils Absolute: 129 cells/uL (ref 15–500)
Eosinophils Relative: 2.3 %
HCT: 39.1 % (ref 35.0–45.0)
Hemoglobin: 13.4 g/dL (ref 11.7–15.5)
LYMPHS ABS: 1635 {cells}/uL (ref 850–3900)
MCH: 29.6 pg (ref 27.0–33.0)
MCHC: 34.3 g/dL (ref 32.0–36.0)
MCV: 86.3 fL (ref 80.0–100.0)
MPV: 9.3 fL (ref 7.5–12.5)
Monocytes Relative: 8.4 %
NEUTROS ABS: 3326 {cells}/uL (ref 1500–7800)
Neutrophils Relative %: 59.4 %
Platelets: 340 10*3/uL (ref 140–400)
RBC: 4.53 10*6/uL (ref 3.80–5.10)
RDW: 11.8 % (ref 11.0–15.0)
Total Lymphocyte: 29.2 %
WBC: 5.6 10*3/uL (ref 3.8–10.8)

## 2019-01-01 MED ORDER — MONTELUKAST SODIUM 10 MG PO TABS: 10.0000 mg | ORAL_TABLET | Freq: Every day | ORAL | 11 refills | Status: DC

## 2019-01-01 MED ORDER — FLUTICASONE FUROATE 27.5 MCG/SPRAY NA SUSP: 2.0000 | Freq: Every day | NASAL | Status: DC

## 2019-01-01 NOTE — Patient Instructions (Addendum)
Please contact your gynecologist about the nausea and pain you experience with your periods We'll get labs today If you have not heard anything from my staff in a week about any orders/referrals/studies from today, please contact us here to follow-up [phone]

## 2019-01-01 NOTE — Progress Notes (Signed)
BP 100/62   Pulse 94   Temp 98.6 F (37 C)   Resp 16   Ht 5\' 9"  (1.753 m)   Wt 159 lb (72.1 kg)   LMP 12/17/2018   SpO2 97%   BMI 23.48 kg/m    Subjective:    Patient ID: Ana Knapp, female    DOB: Mar 17, 1981, 38 y.o.   MRN: [id number]  HPI: Ana Knapp is a 38 y.o. female  Chief Complaint  Patient presents with  . Follow-up  . Cough    Dx with flu 2 weeks ago. Still having a bad cough with congestion    HPI   He/she specifically denies travel to/from and exposure to others who have traveled to/from [place], [place], [place], [place], or [place]  She was at the urgent care; diagnosed with the flu just based on clinical suspicion; woke up in the middle of the night feeling terrible; went to urgent care and treated with tamiflu; next day she felt like she couldn't breathe very well; fever rose from 101 to 102 degrees; seen in the ER the next day, note reviwed; body aches, cough, lower mid chest pain; finished Tamiflu, no problems  CXR done CLINICAL DATA:  Diagnosed with flu yesterday, difficulty breathing. History of lung clots.  EXAM: CHEST - 2 VIEW  COMPARISON:  Chest x-ray dated 12/11/2015.  FINDINGS: Heart size and mediastinal contours are within normal limits. Lungs are clear. No pleural effusion or pneumothorax seen. Osseous structures about the chest are unremarkable.  IMPRESSION: No active cardiopulmonary disease. No evidence of pneumonia or pulmonary edema.   Electronically Signed   By: Franki Cabot M.D.   On: 12/20/2018 16:05  Patient did not get the flu shot this past year; her doctor in [place] told her to not get the flu shot a long time; she has had tetanus booster; no known immune deficiency  She is on the mend, "oh yeah, I feel better"; Sunday she blew her nose and blew out a little blood; today a little red tinge blowing today from both sides; no rash  Reviewed labs from ER; WBC was only 2.4, platlet count just a little low at  139; no easy bruising or bleeding  Working with gynecologist for her endometriosis; hx of blood clot so can't use traditional medicines; she talked to her blood clot doctor in January; they could go ahead and do that but they would have to do the blood thinners again and she doesn't really want to do that; plus it's a risk  Allergic rhinitis; allergic to "anything"; pets at home, she loves her animals; not around a lot of dust or other exposures; year -round  GI cared for by Dr. Allen Norris  Depression screen [facility] 2/9 01/01/2019 11/22/2017 02/21/2017 08/18/2016 06/15/2016  Decreased Interest 1 0 0 0 0  Down, Depressed, Hopeless 2 0 1 0 0  PHQ - 2 Score 3 0 1 0 0  Altered sleeping 0 - - - -  Tired, decreased energy 1 - - - -  Change in appetite 0 - - - -  Feeling bad or failure about yourself  0 - - - -  Trouble concentrating 0 - - - -  Moving slowly or fidgety/restless 0 - - - -  Suicidal thoughts 0 - - - -  PHQ-9 Score 4 - - - -  Difficult doing work/chores Not difficult at all - - - -  MD note: fatigue due to endometriosis and fibromyalgia; not a  clinical depression or psychiatric  Fall Risk  01/01/2019 11/22/2017 02/21/2017 08/18/2016 06/15/2016  Falls in the past year? 0 Yes Yes No No  Number falls in past yr: - 1 1 - -  Injury with Fall? - No No - -    Relevant past medical, surgical, family and social history reviewed Past Medical History:  Diagnosis Date  . Chronic sinusitis   . Fibromyalgia   . GERD (gastroesophageal reflux disease)   . Headache    sinus  . History of melanoma 11/22/2017  . History of melanoma 2018   Back  . May-Thurner syndrome 11/30/2015   January 2017; 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[facility]   . PONV (postoperative nausea and vomiting)   . Pulmonary embolism [facility])    Past Surgical History:  Procedure Laterality Date  . ANGIOPLASTY / STENTING FEMORAL  March 2017  . CHOLECYSTECTOMY    . COLONOSCOPY WITH PROPOFOL N/A 01/02/2018   Procedure: COLONOSCOPY WITH PROPOFOL;  Surgeon:  Lucilla Lame, MD;  Location: [facility] ENDOSCOPY;  Service: Endoscopy;  Laterality: N/A;  . ESOPHAGOGASTRODUODENOSCOPY (EGD) WITH PROPOFOL N/A 01/02/2018   Procedure: ESOPHAGOGASTRODUODENOSCOPY (EGD) WITH PROPOFOL;  Surgeon: Lucilla Lame, MD;  Location: [facility] ENDOSCOPY;  Service: Endoscopy;  Laterality: N/A;  . LAPAROSCOPY N/A 02/26/2018   Procedure: LAPAROSCOPY DIAGNOSTIC WITH BIOPSIES;  Surgeon: Rubie Maid, MD;  Location: [facility] ORS;  Service: Gynecology;  Laterality: N/A;  . Lytics Catheter Placement  11/16/15  . MELANOMA EXCISION  2018   Back  . ROBOTIC ASSISTED LAPAROSCOPIC CHOLECYSTECTOMY-SINGLE SITE  12/29/2016   Family History  Problem Relation Age of Onset  . Thyroid disease Mother   . Diabetes Mother   . Cancer Father        bladder  . Hypertension Maternal Grandmother   . Thyroid disease Paternal Grandmother   . Diabetes Paternal Grandmother   . Stroke Paternal Grandmother   . Cancer Paternal Grandfather        skin  . Heart disease Neg Hx   . COPD Neg Hx    Social History   Tobacco Use  . Smoking status: Never Smoker  . Smokeless tobacco: Never Used  Substance Use Topics  . Alcohol use: Yes    Comment: rare  . Drug use: No     Office Visit from 01/01/2019 in [facility]  AUDIT-C Score  0      Interim medical history since last visit reviewed. Allergies and medications reviewed  Review of Systems Per HPI unless specifically indicated above     Objective:    BP 100/62   Pulse 94   Temp 98.6 F (37 C)   Resp 16   Ht 5\' 9"  (1.753 m)   Wt 159 lb (72.1 kg)   LMP 12/17/2018   SpO2 97%   BMI 23.48 kg/m   Wt Readings from Last 3 Encounters:  01/01/19 159 lb (72.1 kg)  12/20/18 150 lb (68 kg)  12/19/18 150 lb (68 kg)    Physical Exam Constitutional:      Appearance: She is well-developed.  HENT:     Right Ear: Tympanic membrane and ear canal normal.     Left Ear: Tympanic membrane and ear canal normal.     Nose: Rhinorrhea present.  Rhinorrhea is clear.     Mouth/Throat:     Pharynx: No oropharyngeal exudate or posterior oropharyngeal erythema.  Eyes:     General: No scleral icterus. Cardiovascular:     Rate and Rhythm: Normal rate and regular rhythm.  Pulmonary:     Effort: Pulmonary effort is normal.     Breath sounds: Normal breath sounds.  Lymphadenopathy:     Cervical: No cervical adenopathy.  Psychiatric:        Behavior: Behavior normal.       Assessment & Plan:   Problem List Items Addressed This Visit      Respiratory   Seasonal allergic rhinitis    singulair and nasal corticosteorid        Other   Endometriosis    encouragd her to talk with her GYN about her symptoms; would want all related meds to come from specialist so they are aware of severity of her symptoms; she understands       Other Visit Diagnoses    Influenza    -  Primary   recovering from flu; improving clinically; secondary pneumonia something to watch for, seek help if worsening   Cough       recent CXR reviewed; seek care if worsening   Neutropenia, unspecified type ([place])       noted on labs; recheck today; may have been seconeary to the viral illness   Relevant Orders   CBC with Differential/Platelet (Completed)   Thrombocytopenia ([place])       noted on outside labs; recheck today; no s/s of bleeding   Relevant Orders   CBC with Differential/Platelet (Completed)   Hypocalcemia       noted on outside labs; check today   Relevant Orders   BASIC METABOLIC PANEL WITH GFR (Completed)       Follow up plan: No follow-ups on file.  An after-visit summary was printed and given to the patient at [facility] [place].  Please see the patient instructions which may contain other information and recommendations beyond what is mentioned above in the assessment and plan.  Meds ordered this encounter  Medications  . montelukast (SINGULAIR) 10 MG tablet    Sig: Take 1 tablet (10 mg total) by mouth daily.    Dispense:  30 tablet     Refill:  11  . fluticasone (FLONASE SENSIMIST) 27.5 MCG/SPRAY nasal spray    Sig: Place 2 sprays into the nose daily.    Orders Placed This Encounter  Procedures  . CBC with Differential/Platelet  . BASIC METABOLIC PANEL WITH GFR

## 2019-01-04 NOTE — Assessment & Plan Note (Signed)
encouragd her to talk with her GYN about her symptoms; would want all related meds to come from specialist so they are aware of severity of her symptoms; she understands

## 2019-01-04 NOTE — Assessment & Plan Note (Signed)
singulair and nasal corticosteorid

## 2019-02-27 ENCOUNTER — Other Ambulatory Visit: Payer: Self-pay

## 2019-02-27 ENCOUNTER — Telehealth: Payer: Self-pay | Admitting: Gastroenterology

## 2019-02-27 MED ORDER — DEXLANSOPRAZOLE 60 MG PO CPDR: 60.0000 mg | DELAYED_RELEASE_CAPSULE | Freq: Every day | ORAL | 6 refills | Status: DC

## 2019-02-27 NOTE — Telephone Encounter (Signed)
Pt is calling she needs refill on rx Dexilant 60 mg send to [facility] in [place] qty:30

## 2019-02-27 NOTE — Telephone Encounter (Signed)
Refilled pt's dexilant 60mg .

## 2019-04-01 ENCOUNTER — Encounter: Payer: Self-pay | Admitting: Family Medicine

## 2019-04-01 ENCOUNTER — Other Ambulatory Visit: Payer: Self-pay

## 2019-04-01 ENCOUNTER — Ambulatory Visit (INDEPENDENT_AMBULATORY_CARE_PROVIDER_SITE_OTHER): Payer: BC Managed Care – PPO | Admitting: Family Medicine

## 2019-04-01 VITALS — BP 120/60 | HR 81 | Temp 98.2°F | Resp 16 | Ht 69.0 in | Wt 157.1 lb

## 2019-04-01 DIAGNOSIS — H9313 Tinnitus, bilateral: Secondary | ICD-10-CM | POA: Diagnosis not present

## 2019-04-01 DIAGNOSIS — L659 Nonscarring hair loss, unspecified: Secondary | ICD-10-CM | POA: Diagnosis not present

## 2019-04-01 DIAGNOSIS — R5383 Other fatigue: Secondary | ICD-10-CM | POA: Diagnosis not present

## 2019-04-01 DIAGNOSIS — R42 Dizziness and giddiness: Secondary | ICD-10-CM

## 2019-04-01 NOTE — Progress Notes (Signed)
Name: Ana Knapp   MRN: [id number]    DOB: Aug 03, 1981   Date:04/01/2019       Progress Note  Subjective  Chief Complaint  Chief Complaint  Patient presents with  . Consult    for thyroid, hair loss, tired  . Tinnitus    HPI  Pt presents with concern for fatigue, hair thinning, and tinnitus R>L, lightheadedness for about a month now.  She notes family history of hypothyroidism but no thyroid cancer history (Mother, MGM, PGM).   - She denies acute vision changes, but has noticed her near-sighted vision is worsening. Has eye appt on Wednesday in 2 days. - Has headache at work when she is stressed (she works at a [employer]).  No confusion, slurred speech, numbness/tingling, or extremity weakness.  - Tinnitus is described as lasting for a few seconds, does not occur daily. Sounds like a loud beep.  - Has GERD and gets upper abdominal pain and mild nausea - Dexilant helps this.  She is usually constipated, but lately she has been having some diarrhea episodes - did start trulance in February 2020. - Lightheadedness, but no dizziness; usually occurs with tinnitus.  - No chest pain, shortness of breath, or palpitations, no brittle nails or skin changes. Endorses decreased libido since having endometriosis removed 02/26/2018.    Office Visit from 04/01/2019 in [facility]  PHQ-9 Total Score  2     Patient Active Problem List   Diagnosis Date Noted  . Endometriosis 03/01/2018  . Abnormal CT scan   . Other specified diseases of intestine   . Nausea 11/22/2017  . PONV (postoperative nausea and vomiting) 11/22/2017  . History of melanoma 11/22/2017  . Biliary dyskinesia 12/05/2016  . Constipation 08/18/2016  . Pre-conception counseling 08/18/2016  . Vitamin B12 deficiency 08/18/2016  . Vitamin D deficiency 08/18/2016  . Shortness of breath 06/19/2016  . Abdominal pain 06/15/2016  . Microcytic hypochromic anemia 06/15/2016  . Abnormal weight gain  06/15/2016  . Fatigue 06/15/2016  . Hyperphosphatemia 11/30/2015  . May-Thurner syndrome 11/30/2015  . DVT (deep venous thrombosis) ([place]) 11/13/2015  . History of pulmonary embolism 11/13/2015  . Asthma exacerbation attacks 11/04/2015  . PE (pulmonary thromboembolism) ([place]) 10/25/2015  . Sinus problem 10/13/2015  . Abdominal pain, chronic, epigastric 09/28/2015  . GERD (gastroesophageal reflux disease) 09/28/2015  . Flushing 09/24/2015  . Fibromyalgia 08/30/2015  . Seasonal allergic rhinitis 08/30/2015  . Alopecia 08/30/2015    Social History   Tobacco Use  . Smoking status: Never Smoker  . Smokeless tobacco: Never Used  Substance Use Topics  . Alcohol use: Yes    Comment: rare     Current Outpatient Medications:  .  dexlansoprazole (DEXILANT) 60 MG capsule, Take 1 capsule (60 mg total) by mouth at bedtime., Disp: 30 capsule, Rfl: 6 .  fluticasone (FLONASE SENSIMIST) 27.5 MCG/SPRAY nasal spray, Place 2 sprays into the nose daily., Disp: , Rfl:  .  fluticasone (FLONASE) 50 MCG/ACT nasal spray, Place 2 sprays into both nostrils daily., Disp: 16 g, Rfl: 0 .  montelukast (SINGULAIR) 10 MG tablet, Take 1 tablet (10 mg total) by mouth daily., Disp: 30 tablet, Rfl: 11 .  Plecanatide (TRULANCE) 3 MG TABS, Take 3 mg by mouth daily., Disp: 30 tablet, Rfl: 11 .  albuterol (PROVENTIL HFA;VENTOLIN HFA) 108 (90 Base) MCG/ACT inhaler, Inhale 2 puffs into the lungs every 6 (six) hours as needed for wheezing or shortness of breath. (Patient not taking: Reported on  01/01/2019), Disp: 1 Inhaler, Rfl: 0 .  guaiFENesin-codeine 100-10 MG/5ML syrup, Take 10 mLs by mouth every 6 (six) hours as needed for cough. (Patient not taking: Reported on 04/01/2019), Disp: 120 mL, Rfl: 0 .  ondansetron (ZOFRAN) 4 MG tablet, Take 1 tablet (4 mg total) by mouth every 8 (eight) hours as needed for nausea or vomiting. (Patient not taking: Reported on 04/01/2019), Disp: 30 tablet, Rfl: 3 .  polyethylene glycol (MIRALAX /  GLYCOLAX) packet, Take 17 g by mouth daily. , Disp: , Rfl:   Allergies  Allergen Reactions  . Ortho Tri-Cyclen [Norgestimate-Eth Estradiol] Other (See Comments)    Blood clot and PE  . Prednisone Anaphylaxis  . Augmentin [Amoxicillin-Pot Clavulanate] Other (See Comments)    Upsets her stomach    I personally reviewed active problem list, medication list, allergies, health maintenance, notes from last encounter, lab results with the patient/caregiver today.  ROS  Ten systems reviewed and is negative except as mentioned in HPI  Objective  Vitals:   04/01/19 1029  BP: 120/60  Pulse: 81  Resp: 16  Temp: 98.2 F (36.8 C)  TempSrc: Oral  SpO2: 99%  Weight: 157 lb 1.6 oz (71.3 kg)  Height: 5\' 9"  (1.753 m)   Body mass index is 23.2 kg/m.  Nursing Note and Vital Signs reviewed.  Physical Exam  Constitutional: Patient appears well-developed and well-nourished. No distress.  HENT: Head: Normocephalic and atraumatic. Ears: bilateral TMs with no erythema or effusion; Nose: Nose normal. Mouth/Throat: Oropharynx is clear and moist. No oropharyngeal exudate or tonsillar swelling.  Eyes: Conjunctivae and EOM are normal. No scleral icterus.  Pupils are equal, round, and reactive to light.  Neck: Normal range of motion. Neck supple. No JVD present. No thyromegaly present.  Cardiovascular: Normal rate, regular rhythm and normal heart sounds.  No murmur heard. No BLE edema. Pulmonary/Chest: Effort normal and breath sounds normal. No respiratory distress. Abdominal: Soft. Bowel sounds are normal, no distension. There is no tenderness. No masses. Musculoskeletal: Normal range of motion, no joint effusions. No gross deformities Neurological: Pt is alert and oriented to person, place, and time. No cranial nerve deficit. Coordination, balance, strength, speech and gait are normal.  Skin: Skin is warm and dry. No rash noted. No erythema.  Psychiatric: Patient has a normal mood and affect.  behavior is normal. Judgment and thought content normal.  No results found for this or any previous visit (from the past 72 hour(s)).  Assessment & Plan  1. Fatigue, unspecified type - CBC with Differential/Platelet - COMPLETE METABOLIC PANEL WITH GFR - Thyroid Panel With TSH  2. Hair thinning - CBC with Differential/Platelet - Thyroid Panel With TSH  3. Tinnitus of both ears - Consider ENT referral if labs are unremarkable - COMPLETE METABOLIC PANEL WITH GFR  4. Lightheadedness - CBC with Differential/Platelet - COMPLETE METABOLIC PANEL WITH GFR - Thyroid Panel With TSH  -Red flags and when to present for emergency care or RTC including fever >101.44F, chest pain, shortness of breath, new/worsening/un-resolving symptoms, reviewed with patient at time of visit. Follow up and care instructions discussed and provided in AVS.

## 2019-04-02 LAB — CBC WITH DIFFERENTIAL/PLATELET
Absolute Monocytes: 493 cells/uL (ref 200–950)
Basophils Absolute: 39 cells/uL (ref 0–200)
Basophils Relative: 0.7 %
Eosinophils Absolute: 280 cells/uL (ref 15–500)
Eosinophils Relative: 5 %
HCT: 40.8 % (ref 35.0–45.0)
Hemoglobin: 13.3 g/dL (ref 11.7–15.5)
Lymphs Abs: 1658 cells/uL (ref 850–3900)
MCH: 29.2 pg (ref 27.0–33.0)
MCHC: 32.6 g/dL (ref 32.0–36.0)
MCV: 89.7 fL (ref 80.0–100.0)
MPV: 10.1 fL (ref 7.5–12.5)
Monocytes Relative: 8.8 %
Neutro Abs: 3130 cells/uL (ref 1500–7800)
Neutrophils Relative %: 55.9 %
Platelets: 229 10*3/uL (ref 140–400)
RBC: 4.55 10*6/uL (ref 3.80–5.10)
RDW: 12 % (ref 11.0–15.0)
Total Lymphocyte: 29.6 %
WBC: 5.6 10*3/uL (ref 3.8–10.8)

## 2019-04-02 LAB — COMPLETE METABOLIC PANEL WITH GFR
AG Ratio: 1.8 (calc) (ref 1.0–2.5)
ALT: 13 U/L (ref 6–29)
AST: 16 U/L (ref 10–30)
Albumin: 4.5 g/dL (ref 3.6–5.1)
Alkaline phosphatase (APISO): 27 U/L — ABNORMAL LOW (ref 31–125)
BUN: 14 mg/dL (ref 7–25)
CO2: 25 mmol/L (ref 20–32)
Calcium: 9.6 mg/dL (ref 8.6–10.2)
Chloride: 106 mmol/L (ref 98–110)
Creat: 0.78 mg/dL (ref 0.50–1.10)
GFR, Est African American: 110 mL/min/{1.73_m2} (ref >=60)
GFR, Est Non African American: 95 mL/min/{1.73_m2} (ref >=60)
Globulin: 2.5 g/dL (calc) (ref 1.9–3.7)
Glucose, Bld: 79 mg/dL (ref 65–99)
Potassium: 4.4 mmol/L (ref 3.5–5.3)
Sodium: 138 mmol/L (ref 135–146)
Total Bilirubin: 0.6 mg/dL (ref 0.2–1.2)
Total Protein: 7 g/dL (ref 6.1–8.1)

## 2019-04-02 LAB — THYROID PANEL WITH TSH
Free Thyroxine Index: 2.2 (ref 1.4–3.8)
T3 Uptake: 29 % (ref 22–35)
T4, Total: 7.5 ug/dL (ref 5.1–11.9)
TSH: 1.16 mIU/L

## 2019-04-04 ENCOUNTER — Telehealth: Payer: Self-pay | Admitting: Gastroenterology

## 2019-04-04 ENCOUNTER — Other Ambulatory Visit: Payer: Self-pay

## 2019-04-04 NOTE — Telephone Encounter (Signed)
Prior authorization for Dexilant 60mg  has been approved. Valid from 04/04/2019 - 04/02/2022

## 2019-04-04 NOTE — Telephone Encounter (Signed)
Pt left vm she is totally out of her rx Dexilant and her pharmacy send over a prior authorization please call pt

## 2019-04-10 ENCOUNTER — Encounter: Payer: Self-pay | Admitting: Family Medicine

## 2019-04-12 ENCOUNTER — Telehealth: Payer: Self-pay | Admitting: Gastroenterology

## 2019-04-12 NOTE — Telephone Encounter (Signed)
Pt  Left vm she needs prior authorization on  rx Trulance  She did receive her rx Dexilint

## 2019-04-17 NOTE — Telephone Encounter (Signed)
Pt called & was checking on her Plecanatide (TRULANCE) 3 MG TABS to see if the authorization had gone through. She is out of the medication for 3-4 day. Please contact [employer].

## 2019-04-22 NOTE — Telephone Encounter (Signed)
Contacted pt and advised her the Trulance has been approved. Effective from 04/22/2019 through 04/20/2020.

## 2019-07-08 ENCOUNTER — Other Ambulatory Visit: Payer: Self-pay

## 2019-07-08 ENCOUNTER — Encounter: Payer: Self-pay | Admitting: Family Medicine

## 2019-07-08 ENCOUNTER — Ambulatory Visit (INDEPENDENT_AMBULATORY_CARE_PROVIDER_SITE_OTHER): Payer: BC Managed Care – PPO | Admitting: Family Medicine

## 2019-07-08 VITALS — BP 110/72 | HR 86 | Temp 97.6°F | Resp 18 | Ht 69.0 in | Wt 154.4 lb

## 2019-07-08 DIAGNOSIS — M25561 Pain in right knee: Secondary | ICD-10-CM

## 2019-07-08 MED ORDER — DICLOFENAC SODIUM 1 % TD GEL: 2.0000 g | Freq: Four times a day (QID) | TRANSDERMAL | 1 refills | Status: DC | PRN

## 2019-07-08 NOTE — Patient Instructions (Addendum)
Voltaren Gel Ice/Ice massage 2-3 times a day Knee exercises 3 times a week as tolerated. Call back in 2-3 weeks if not improving and we will refer to ortho.

## 2019-07-08 NOTE — Progress Notes (Signed)
Name: Ana Knapp   MRN: [id number]    DOB: Mar 11, 1981   Date:07/08/2019       Progress Note  Subjective  Chief Complaint  Chief Complaint  Patient presents with  . Knee Pain    right kneepain, swelling for 2 weeks    HPI  PT presents with 2 weeks of RIGHT knee pain, worsened last week.  The pain is on the medial aspect of the right knee and the posterior aspect along with some pain just above the knee on the anterior aspect.  Has had some minor swelling to the medial aspect of the knee.  She has been doing more online exercises which involve more jumping and squatting than she used to do at the gym.  She has not had any acute injury, no clicks/pops/snaps that she has noticed. She denies weakness, but notes she is walking with an antalgic gait.  She is apply heat and ice to the area, tylenol did not really help her symptoms.  She does have history of blood clot in the LEFT lower extremity about 5 years ago with history of PE as well.  She is not taking blood thinner or ASA.  She denies any calf pain/tenderness/swelling bilaterally today.   Patient Active Problem List   Diagnosis Date Noted  . Endometriosis 03/01/2018  . Abnormal CT scan   . Other specified diseases of intestine   . Nausea 11/22/2017  . PONV (postoperative nausea and vomiting) 11/22/2017  . History of melanoma 11/22/2017  . Biliary dyskinesia 12/05/2016  . Constipation 08/18/2016  . Pre-conception counseling 08/18/2016  . Vitamin B12 deficiency 08/18/2016  . Vitamin D deficiency 08/18/2016  . Shortness of breath 06/19/2016  . Abdominal pain 06/15/2016  . Microcytic hypochromic anemia 06/15/2016  . Abnormal weight gain 06/15/2016  . Fatigue 06/15/2016  . Hyperphosphatemia 11/30/2015  . May-Thurner syndrome 11/30/2015  . DVT (deep venous thrombosis) ([place]) 11/13/2015  . History of pulmonary embolism 11/13/2015  . Asthma exacerbation attacks 11/04/2015  . PE (pulmonary thromboembolism) ([place]) 10/25/2015   . Sinus problem 10/13/2015  . Abdominal pain, chronic, epigastric 09/28/2015  . GERD (gastroesophageal reflux disease) 09/28/2015  . Flushing 09/24/2015  . Fibromyalgia 08/30/2015  . Seasonal allergic rhinitis 08/30/2015  . Alopecia 08/30/2015    Social History   Tobacco Use  . Smoking status: Never Smoker  . Smokeless tobacco: Never Used  Substance Use Topics  . Alcohol use: Yes    Comment: rare     Current Outpatient Medications:  .  dexlansoprazole (DEXILANT) 60 MG capsule, Take 1 capsule (60 mg total) by mouth at bedtime., Disp: 30 capsule, Rfl: 6 .  fluticasone (FLONASE SENSIMIST) 27.5 MCG/SPRAY nasal spray, Place 2 sprays into the nose daily., Disp: , Rfl:  .  fluticasone (FLONASE) 50 MCG/ACT nasal spray, Place 2 sprays into both nostrils daily., Disp: 16 g, Rfl: 0 .  montelukast (SINGULAIR) 10 MG tablet, Take 1 tablet (10 mg total) by mouth daily., Disp: 30 tablet, Rfl: 11 .  polyethylene glycol (MIRALAX / GLYCOLAX) packet, Take 17 g by mouth daily. , Disp: , Rfl:  .  Plecanatide (TRULANCE) 3 MG TABS, Take 3 mg by mouth daily. (Patient not taking: Reported on 07/08/2019), Disp: 30 tablet, Rfl: 11  Allergies  Allergen Reactions  . Ortho Tri-Cyclen [Norgestimate-Eth Estradiol] Other (See Comments)    Blood clot and PE  . Prednisone Anaphylaxis  . Augmentin [Amoxicillin-Pot Clavulanate] Other (See Comments)    Upsets her stomach    I  personally reviewed active problem list, medication list, allergies with the patient/caregiver today.  ROS  Constitutional: Negative for fever or weight change.  Respiratory: Negative for cough and shortness of breath.   Cardiovascular: Negative for chest pain or palpitations.  Gastrointestinal: See HPI Skin: Negative for rash.  Neurological: Negative for dizziness or headache.  No other specific complaints in a complete review of systems (except as listed in HPI above).  Objective  Vitals:   07/08/19 1110  BP: 110/72  Pulse: 86   Resp: 18  Temp: 97.6 F (36.4 C)  TempSrc: Oral  SpO2: 99%  Weight: 154 lb 6.4 oz (70 kg)  Height: 5\' 9"  (1.753 m)   Body mass index is 22.8 kg/m.  Nursing Note and Vital Signs reviewed.  Physical Exam  Constitutional: Patient appears well-developed and well-nourished. No distress.  HENT: Head: Normocephalic and atraumatic. Ears: bilateral TMs with no erythema or effusion; Nose: Nose normal. Mouth/Throat: Oropharynx is clear and moist. No oropharyngeal exudate or tonsillar swelling.  Eyes: Conjunctivae and EOM are normal. No scleral icterus.  Pupils are equal, round, and reactive to light.  Neck: Normal range of motion. Neck supple. No JVD present. No thyromegaly present.  Cardiovascular: Normal rate, regular rhythm and normal heart sounds.  No murmur heard. No BLE edema. Pulmonary/Chest: Effort normal and breath sounds normal. No respiratory distress. Abdominal: Soft. Bowel sounds are normal, no distension. There is no tenderness. No masses. Musculoskeletal: Normal range of motion, no joint effusions. No gross deformities.  The Right knee does not exhibit crepitus, laxity, has negative drawer testing, normal bilateral popliteal fossas, no calf pain/tenderness/erythema/swelling bilaterally. Neurological: Pt is alert and oriented to person, place, and time. No cranial nerve deficit. Coordination, balance, strength, speech and gait are normal.  Skin: Skin is warm and dry. No rash noted. No erythema.  Psychiatric: Patient has a normal mood and affect. behavior is normal. Judgment and thought content normal.   No results found for this or any previous visit (from the past 72 hour(s)).  Assessment & Plan  1. Acute pain of right knee Voltaren Gel Ice/Ice massage 2-3 times a day Knee exercises 3 times a week as tolerated. Call back in 2-3 weeks if not improving and we will refer to ortho. - diclofenac sodium (VOLTAREN) 1 % GEL; Apply 2 g topically 4 (four) times daily as needed (right  knee pain).  Dispense: 150 g; Refill: 1  -Red flags and when to present for emergency care or RTC including fever >101.73F, chest pain, shortness of breath, new/worsening/un-resolving symptoms, reviewed with patient at time of visit. Follow up and care instructions discussed and provided in AVS.

## 2019-07-29 ENCOUNTER — Encounter: Payer: Self-pay | Admitting: Family Medicine

## 2019-07-29 DIAGNOSIS — M25561 Pain in right knee: Secondary | ICD-10-CM

## 2019-07-29 NOTE — Addendum Note (Signed)
Addended by: Hubbard Hartshorn on: 07/29/2019 01:35 PM   Modules accepted: Orders

## 2019-08-01 ENCOUNTER — Ambulatory Visit: Payer: BC Managed Care – PPO | Admitting: Family Medicine

## 2019-08-08 DIAGNOSIS — S86911A Strain of unspecified muscle(s) and tendon(s) at lower leg level, right leg, initial encounter: Secondary | ICD-10-CM | POA: Diagnosis not present

## 2019-08-14 DIAGNOSIS — S86911D Strain of unspecified muscle(s) and tendon(s) at lower leg level, right leg, subsequent encounter: Secondary | ICD-10-CM | POA: Diagnosis not present

## 2019-08-21 DIAGNOSIS — S86911D Strain of unspecified muscle(s) and tendon(s) at lower leg level, right leg, subsequent encounter: Secondary | ICD-10-CM | POA: Diagnosis not present

## 2019-08-27 DIAGNOSIS — S86911D Strain of unspecified muscle(s) and tendon(s) at lower leg level, right leg, subsequent encounter: Secondary | ICD-10-CM | POA: Diagnosis not present

## 2019-09-04 DIAGNOSIS — S86911D Strain of unspecified muscle(s) and tendon(s) at lower leg level, right leg, subsequent encounter: Secondary | ICD-10-CM | POA: Diagnosis not present

## 2019-09-17 DIAGNOSIS — D2371 Other benign neoplasm of skin of right lower limb, including hip: Secondary | ICD-10-CM | POA: Diagnosis not present

## 2019-09-17 DIAGNOSIS — L918 Other hypertrophic disorders of the skin: Secondary | ICD-10-CM | POA: Diagnosis not present

## 2019-09-29 ENCOUNTER — Other Ambulatory Visit: Payer: Self-pay | Admitting: Gastroenterology

## 2019-10-22 IMAGING — MR MR HEAD WO/W CM
12 of 13 series · 34 of 48 positions shown · IV contrast (multihance)
Comparison: Head CT without contrast 8060 hours today.

CLINICAL DATA: 37-year-old female with chief complaint of numbness
and tingling. Patient woke with symptoms at 6266 hours.

EXAM:
MRI HEAD WITHOUT AND WITH CONTRAST
TECHNIQUE: Multiplanar, multiecho pulse sequences of the brain and surrounding
structures were obtained without and with intravenous contrast.
CONTRAST:  15mL MULTIHANCE GADOBENATE DIMEGLUMINE 529 MG/ML IV SOLN

[Series 2: T1 · sagittal · 5.0mm · 0.47mm/px · 2 of 23 slices shown]
[im 1/23]
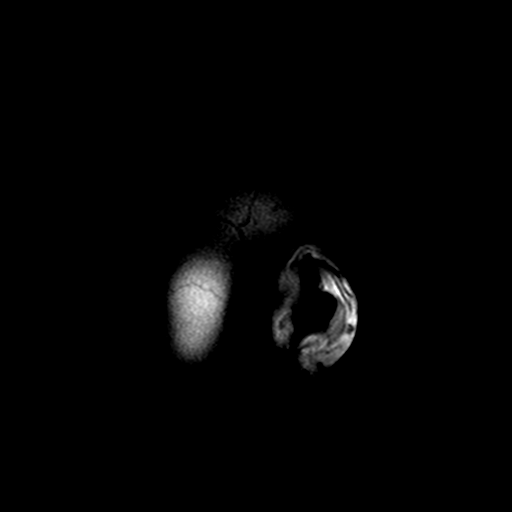
[im 23/23]
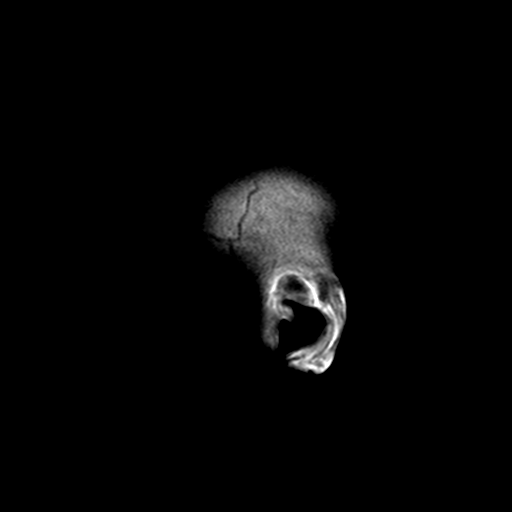

[Series 4: DWI · axial · 3.0mm · 0.94mm/px · z∈[-58,+89]mm · 3 of 50 slices shown (1 of 4)]
[im 1/50]
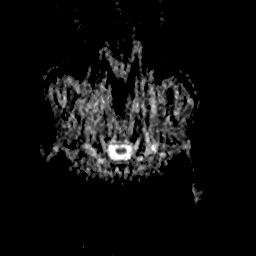
[im 25/50]
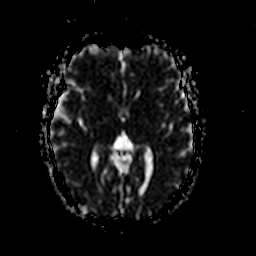
[im 50/50]
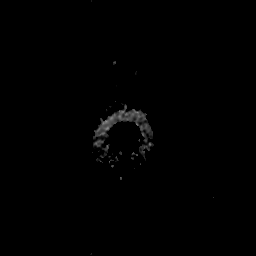

[Series 6: DWI · coronal · 5.0mm · 1.80mm/px · 3 of 39 slices shown (2 of 4)]
[im 1/39]
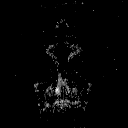
[im 20/39]
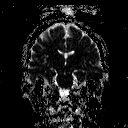
[im 39/39]
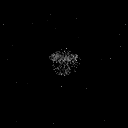

[Series 7: DWI · axial · 3.0mm · 0.94mm/px · z∈[-58,+89]mm · 3 of 50 slices shown (3 of 4)]
[im 1/50]
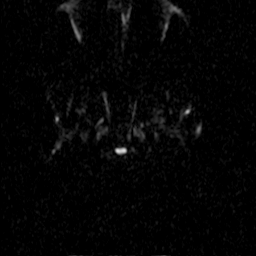
[im 25/50]
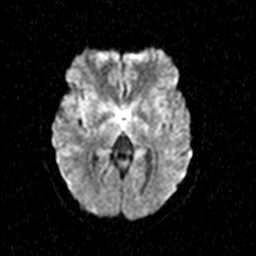
[im 50/50]
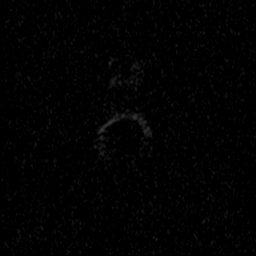

[Series 8: DWI · coronal · 5.0mm · 1.80mm/px · 2 of 35 slices shown (4 of 4)]
[im 1/35]
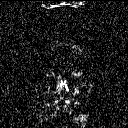
[im 35/35]
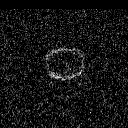

[Series 9: T2 · axial · 5.0mm · 0.45mm/px · z∈[-61,+92]mm · 2 of 23 slices shown (1 of 2)]
[im 1/23]
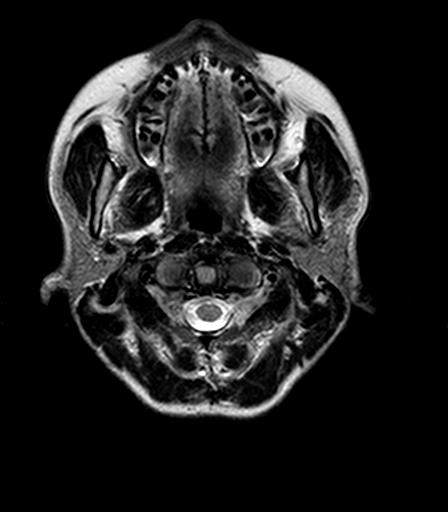
[im 23/23]
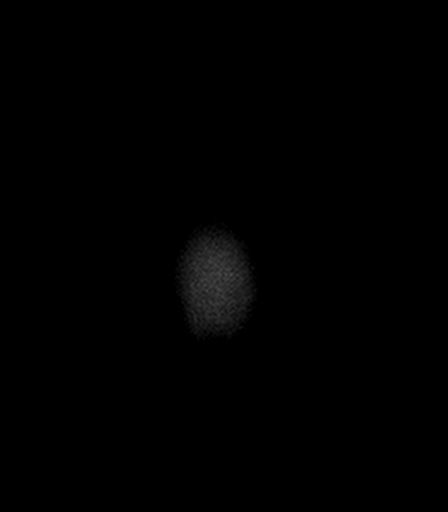

[Series 10: FLAIR · axial · 3.0mm · 0.90mm/px · z∈[-58,+89]mm · 3 of 50 slices shown (1 of 2)]
[im 1/50]
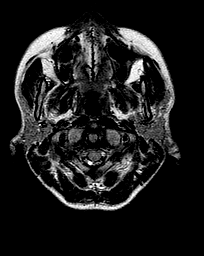
[im 25/50]
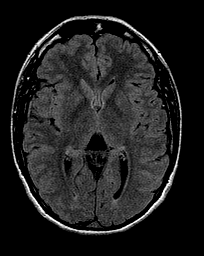
[im 50/50]
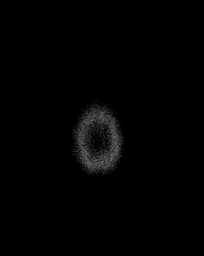

[Series 11: T2 · axial · 5.0mm · 0.45mm/px · z∈[-61,+92]mm · 2 of 23 slices shown (2 of 2)]
[im 1/23]
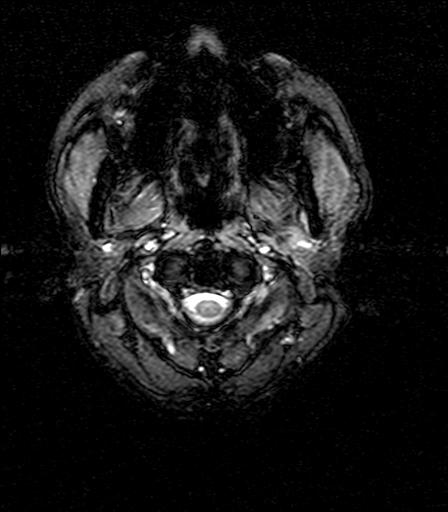
[im 23/23]
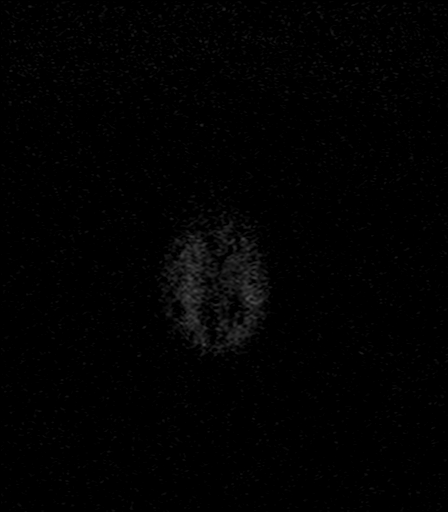

[Series 13: FLAIR · sagittal · 5.0mm · 0.90mm/px · 2 of 23 slices shown (2 of 2)]
[im 1/23]
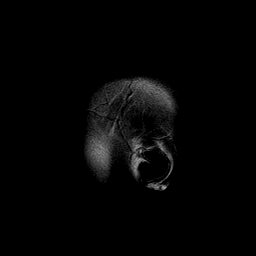
[im 23/23]
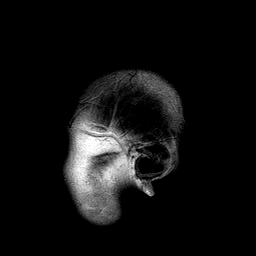

[Series 14: T2 post-contrast · coronal · 5.0mm · 0.45mm/px · 2 of 29 slices shown]
[im 1/29]
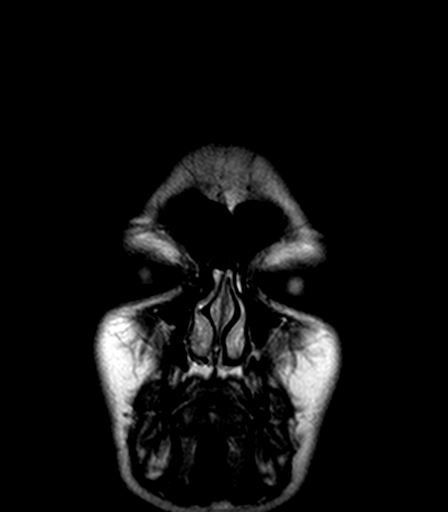
[im 29/29]
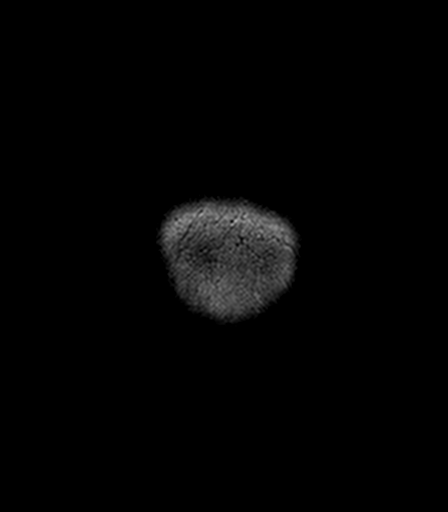

[Series 15: T1 post-contrast · axial · 1.0mm · 0.45mm/px · z∈[-64,+95]mm · 8 of 160 slices shown (1 of 2)]
[im 1/160]
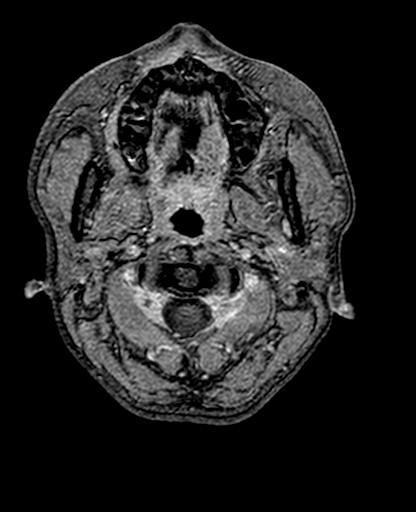
[im 32/160]
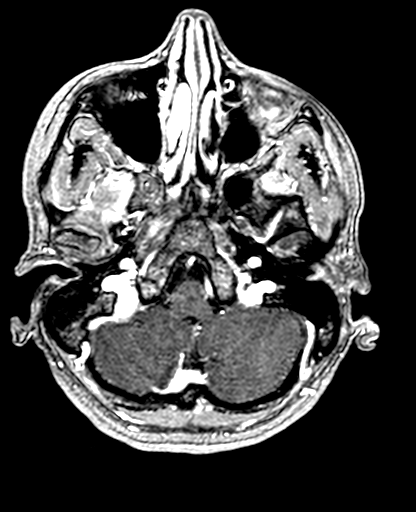
[im 48/160]
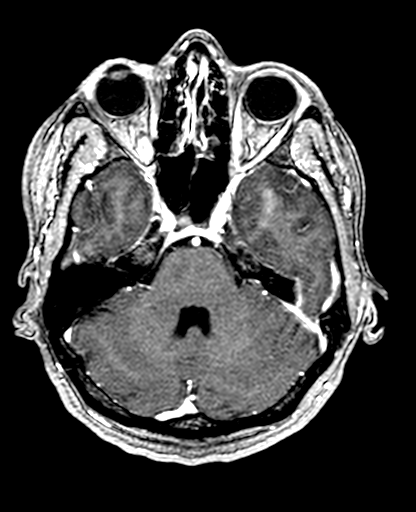
[im 64/160]
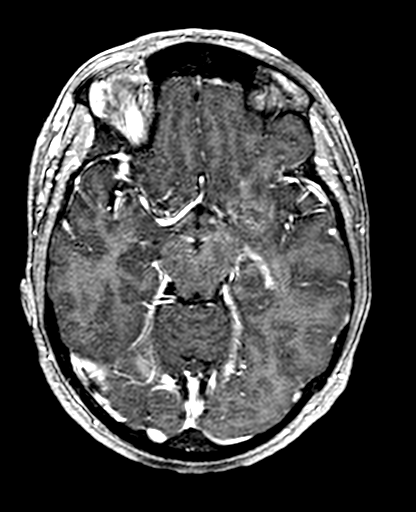
[im 96/160]
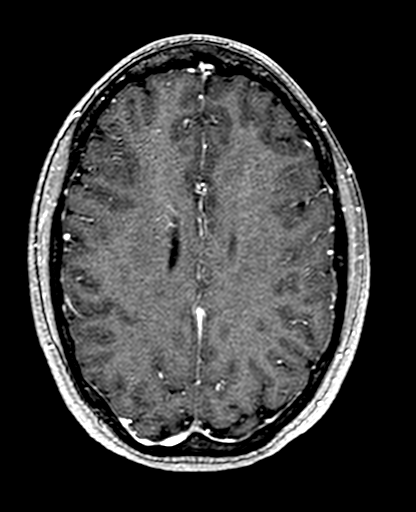
[im 112/160]
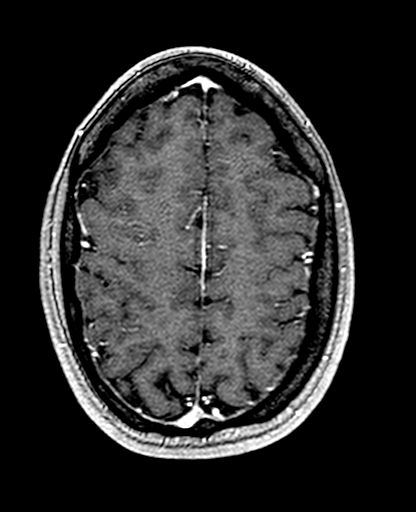
[im 128/160]
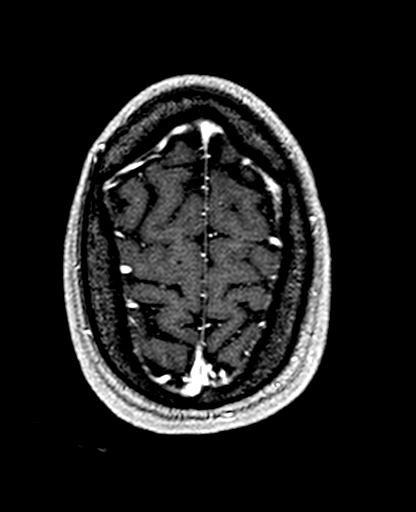
[im 160/160]
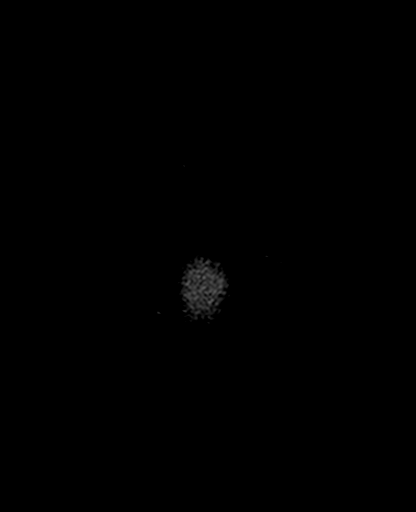

[Series 16: T1 post-contrast · coronal · 5.0mm · 0.45mm/px · 2 of 29 slices shown (2 of 2)]
[im 1/29]
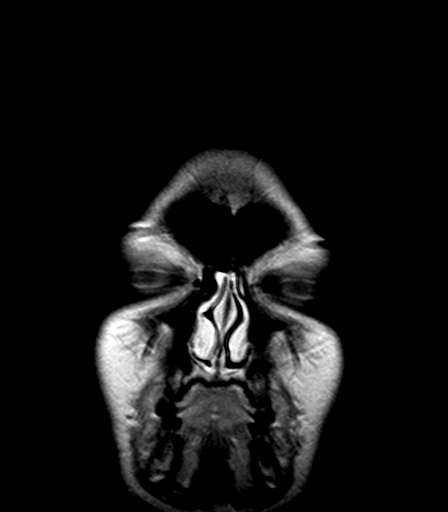
[im 29/29]
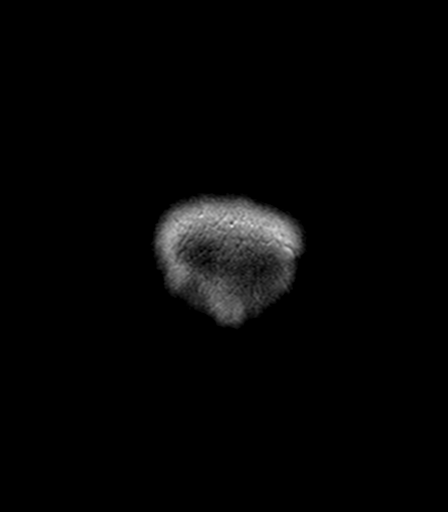

[34 of 48 positions shown; findings below may reference images not displayed]

FINDINGS: Brain: Normal cerebral volume. Cavum vergae (normal variant). No
ventriculomegaly. No restricted diffusion to suggest acute
infarction. No midline shift, mass effect, evidence of mass lesion,
extra-axial collection or acute intracranial hemorrhage.
Cervicomedullary junction and pituitary are within normal limits.

Gray and white matter signal is within normal limits for age
throughout the brain.; there are 1 or 2 small foci of nonspecific
cerebral white matter T2 and FLAIR hyperintensity (e.g. Series 10,
image 27). No cortical encephalomalacia or chronic cerebral blood
products. The bilateral deep gray matter nuclei, brainstem, and
cerebellum appear normal. No abnormal enhancement identified. No
dural thickening.

Vascular: Major intracranial vascular flow voids are preserved. The
major dural venous sinuses also are enhancing and appear to be
patent.

Skull and upper cervical spine: Normal visible cervical spine.
Visualized bone marrow signal is within normal limits.

Sinuses/Orbits: Normal orbits soft tissues. Trace paranasal sinus
mucosal thickening.

Other: Trace bilateral mastoid air cell fluid. Negative nasopharynx.
Visible internal auditory structures appear normal. Scalp and face
soft tissues appear negative.
IMPRESSION: 1. Normal for age MRI appearance of the brain.
2. Trace paranasal sinus and mastoid mucosal thickening/fluid -
appears inconsequential.

## 2019-11-05 DIAGNOSIS — M25561 Pain in right knee: Secondary | ICD-10-CM | POA: Diagnosis not present

## 2019-11-19 DIAGNOSIS — Z95820 Peripheral vascular angioplasty status with implants and grafts: Secondary | ICD-10-CM | POA: Diagnosis not present

## 2019-11-19 DIAGNOSIS — Z9049 Acquired absence of other specified parts of digestive tract: Secondary | ICD-10-CM | POA: Diagnosis not present

## 2019-11-19 DIAGNOSIS — M797 Fibromyalgia: Secondary | ICD-10-CM | POA: Diagnosis not present

## 2019-11-19 DIAGNOSIS — Z959 Presence of cardiac and vascular implant and graft, unspecified: Secondary | ICD-10-CM | POA: Diagnosis not present

## 2019-11-19 DIAGNOSIS — Z86718 Personal history of other venous thrombosis and embolism: Secondary | ICD-10-CM | POA: Diagnosis not present

## 2019-11-19 DIAGNOSIS — Z86711 Personal history of pulmonary embolism: Secondary | ICD-10-CM | POA: Diagnosis not present

## 2019-11-19 DIAGNOSIS — I871 Compression of vein: Secondary | ICD-10-CM | POA: Diagnosis not present

## 2019-12-14 ENCOUNTER — Other Ambulatory Visit: Payer: Self-pay | Admitting: Family Medicine

## 2020-01-13 ENCOUNTER — Encounter: Payer: Self-pay | Admitting: Family Medicine

## 2020-01-14 DIAGNOSIS — Z1151 Encounter for screening for human papillomavirus (HPV): Secondary | ICD-10-CM | POA: Diagnosis not present

## 2020-01-14 DIAGNOSIS — Z124 Encounter for screening for malignant neoplasm of cervix: Secondary | ICD-10-CM | POA: Diagnosis not present

## 2020-01-14 NOTE — Telephone Encounter (Signed)
Pt is scheduled for Thursday

## 2020-01-16 ENCOUNTER — Ambulatory Visit: Payer: BC Managed Care – PPO | Admitting: Family Medicine

## 2020-01-16 ENCOUNTER — Ambulatory Visit (INDEPENDENT_AMBULATORY_CARE_PROVIDER_SITE_OTHER): Payer: BC Managed Care – PPO | Admitting: Family Medicine

## 2020-01-16 ENCOUNTER — Encounter: Payer: Self-pay | Admitting: Family Medicine

## 2020-01-16 DIAGNOSIS — J3089 Other allergic rhinitis: Secondary | ICD-10-CM

## 2020-01-16 DIAGNOSIS — Z1231 Encounter for screening mammogram for malignant neoplasm of breast: Secondary | ICD-10-CM

## 2020-01-16 DIAGNOSIS — M25561 Pain in right knee: Secondary | ICD-10-CM

## 2020-01-16 DIAGNOSIS — K219 Gastro-esophageal reflux disease without esophagitis: Secondary | ICD-10-CM

## 2020-01-16 DIAGNOSIS — N945 Secondary dysmenorrhea: Secondary | ICD-10-CM

## 2020-01-16 DIAGNOSIS — N809 Endometriosis, unspecified: Secondary | ICD-10-CM

## 2020-01-16 DIAGNOSIS — J302 Other seasonal allergic rhinitis: Secondary | ICD-10-CM

## 2020-01-16 DIAGNOSIS — K5909 Other constipation: Secondary | ICD-10-CM

## 2020-01-16 DIAGNOSIS — G8929 Other chronic pain: Secondary | ICD-10-CM

## 2020-01-16 MED ORDER — DICLOFENAC SODIUM 1 % EX GEL: 4.0000 g | Freq: Four times a day (QID) | CUTANEOUS | 2 refills | Status: DC

## 2020-01-16 MED ORDER — LEVOCETIRIZINE DIHYDROCHLORIDE 5 MG PO TABS: 5.0000 mg | ORAL_TABLET | Freq: Every evening | ORAL | 11 refills | Status: DC

## 2020-01-16 MED ORDER — MONTELUKAST SODIUM 10 MG PO TABS: 10.0000 mg | ORAL_TABLET | Freq: Every day | ORAL | 11 refills | Status: DC

## 2020-01-16 MED ORDER — ONDANSETRON HCL 4 MG PO TABS: 4.0000 mg | ORAL_TABLET | Freq: Three times a day (TID) | ORAL | 1 refills | Status: DC | PRN

## 2020-01-16 NOTE — Progress Notes (Signed)
Name: Ana Knapp   MRN: [id number]    DOB: 1981-03-03   Date:01/16/2020       Progress Note  Subjective  Chief Complaint  Chief Complaint  Patient presents with  . Allergic Rhinitis   . Follow-up    wants to discuss Covid vaccine  . Knee Pain    Onset- 6 months, Right knee pain    I connected with  [name] on 01/16/20 at 11:00 AM EDT by telephone and verified that I am speaking with the correct person using two identifiers.  I discussed the limitations, risks, security and privacy concerns of performing an evaluation and management service by telephone and the availability of in person appointments. Staff also discussed with the patient that there may be a patient responsible charge related to this service. Patient Location: at work  Provider Location:  [facility] Additional Individuals present: none  HPI  Counseling about COVID-19.   Perennial AR : she states symptoms are worse this time of the year, she has been taking Xyzak and singulair daily and Flonase this time of the year helps control the symptoms. Nasal congestion, itchy and watery eyes, sneezing, fatigue. She has occasional cough ( history of pulmonary embolism) but stable. She states sob and wheezing started at the time of PE and no problems since she thinks she was misdiagnosed with asthma at the time  Right knee pain: going on for months, seen by Ortho and diagnosed with strain of right knee, she states CBD oils and voltaren gel seems to help. She states dull aching, and worse when is cold   GERD: she states that Dexilant helps with symptoms , still has occasional indigestion and heart burn, sees Dr. Durwin Reges   Chronic constipation: she was given Trulance but stopped taking it because it caused diarrhea. She states bowel movements every 3 days , no blood in stools, she has to strain at times. She is only taking miralax about once a week, advised to try taking it about 3 times a week   Patient Active Problem List   Diagnosis Date Noted  . Endometriosis 03/01/2018  . Abnormal CT scan   . History of melanoma 11/22/2017  . Biliary dyskinesia 12/05/2016  . Constipation 08/18/2016  . Vitamin B12 deficiency 08/18/2016  . Vitamin D deficiency 08/18/2016  . May-Thurner syndrome 11/30/2015  . DVT (deep venous thrombosis) ([place]) 11/13/2015  . History of pulmonary embolism 11/13/2015  . GERD (gastroesophageal reflux disease) 09/28/2015  . Flushing 09/24/2015  . Fibromyalgia 08/30/2015  . Seasonal allergic rhinitis 08/30/2015  . Alopecia 08/30/2015    Past Surgical History:  Procedure Laterality Date  . ANGIOPLASTY / STENTING FEMORAL  March 2017  . CHOLECYSTECTOMY    . COLONOSCOPY WITH PROPOFOL N/A 01/02/2018   Procedure: COLONOSCOPY WITH PROPOFOL;  Surgeon: Lucilla Lame, MD;  Location: [facility] ENDOSCOPY;  Service: Endoscopy;  Laterality: N/A;  . ESOPHAGOGASTRODUODENOSCOPY (EGD) WITH PROPOFOL N/A 01/02/2018   Procedure: ESOPHAGOGASTRODUODENOSCOPY (EGD) WITH PROPOFOL;  Surgeon: Lucilla Lame, MD;  Location: [facility] ENDOSCOPY;  Service: Endoscopy;  Laterality: N/A;  . LAPAROSCOPY N/A 02/26/2018   Procedure: LAPAROSCOPY DIAGNOSTIC WITH BIOPSIES;  Surgeon: Rubie Maid, MD;  Location: [facility] ORS;  Service: Gynecology;  Laterality: N/A;  . Lytics Catheter Placement  11/16/15  . MELANOMA EXCISION  2018   Back  . ROBOTIC ASSISTED LAPAROSCOPIC CHOLECYSTECTOMY-SINGLE SITE  12/29/2016    Family History  Problem Relation Age of Onset  . Thyroid disease Mother   . Diabetes Mother   .  Cancer Father        bladder  . Hypertension Maternal Grandmother   . Hypothyroidism Maternal Grandmother   . Thyroid disease Paternal Grandmother   . Diabetes Paternal Grandmother   . Stroke Paternal Grandmother   . Cancer Paternal Grandfather        skin  . Heart disease Neg Hx   . COPD Neg Hx     Social History   Tobacco Use  . Smoking status: Never Smoker  . Smokeless tobacco: Never Used   Substance Use Topics  . Alcohol use: Yes    Comment: rare    Current Outpatient Medications:  .  DEXILANT 60 MG capsule, TAKE ONE CAPSULE BY MOUTH EVERY NIGHT AT BEDTIME, Disp: 30 capsule, Rfl: 6 .  fluticasone (FLONASE SENSIMIST) 27.5 MCG/SPRAY nasal spray, Place 2 sprays into the nose daily., Disp: , Rfl:  .  montelukast (SINGULAIR) 10 MG tablet, Take 1 tablet (10 mg total) by mouth daily., Disp: 30 tablet, Rfl: 11 .  polyethylene glycol (MIRALAX / GLYCOLAX) packet, Take 17 g by mouth daily. , Disp: , Rfl:  .  Sod Fluoride-Potassium Nitrate (PREVIDENT 5000 SENSITIVE) 1.1-5 % PSTE, Prevident 5000 Enamel Protect 1.1 %-5 % dental paste  BRUSH TEETH WITH PASTE FOR AT LEAST ONE MINUTE 2 TO 3 TIMES DAILY, Disp: , Rfl:  .  diclofenac Sodium (VOLTAREN) 1 % GEL, Apply 4 g topically 4 (four) times daily., Disp: 150 g, Rfl: 2 .  levocetirizine (XYZAL) 5 MG tablet, Take 1 tablet (5 mg total) by mouth every evening., Disp: 30 tablet, Rfl: 11  Allergies  Allergen Reactions  . Ortho Tri-Cyclen [Norgestimate-Eth Estradiol] Other (See Comments)    Blood clot and PE  . Prednisone Anaphylaxis  . Augmentin [Amoxicillin-Pot Clavulanate] Other (See Comments)    Upsets her stomach    I personally reviewed active problem list, medication list, allergies, family history, social history, health maintenance with the patient/caregiver today.   ROS  Ten systems reviewed and is negative except as mentioned in HPI   Objective  Virtual encounter, vitals not obtained.  There is no height or weight on file to calculate BMI.  Physical Exam  Awake, alert and oriented  PHQ2/9: Depression screen [facility] 2/9 01/16/2020 07/08/2019 04/01/2019 01/01/2019 11/22/2017  Decreased Interest 0 0 1 1 0  Down, Depressed, Hopeless 0 0 0 2 0  PHQ - 2 Score 0 0 1 3 0  Altered sleeping 0 0 0 0 -  Tired, decreased energy 0 0 1 1 -  Change in appetite 0 0 0 0 -  Feeling bad or failure about yourself  0 0 0 0 -  Trouble  concentrating 0 0 0 0 -  Moving slowly or fidgety/restless 0 0 0 0 -  Suicidal thoughts 0 0 0 0 -  PHQ-9 Score 0 0 2 4 -  Difficult doing work/chores Not difficult at all Not difficult at all Not difficult at all Not difficult at all -   PHQ-2/9 Result is negative.    Fall Risk: Fall Risk  01/16/2020 07/08/2019 04/01/2019 01/01/2019 11/22/2017  Falls in the past year? 0 0 0 0 Yes  Number falls in past yr: 0 0 0 - 1  Injury with Fall? 0 0 0 - No  Follow up - Falls evaluation completed Falls evaluation completed - -     Assessment & Plan  1. Perennial allergic rhinitis with seasonal variation  - montelukast (SINGULAIR) 10 MG tablet; Take 1 tablet (10 mg total)  by mouth daily.  Dispense: 30 tablet; Refill: 11 - levocetirizine (XYZAL) 5 MG tablet; Take 1 tablet (5 mg total) by mouth every evening.  Dispense: 30 tablet; Refill: 11  2. GERD without esophagitis  Continue dexilant  3. Chronic pain of right knee  - diclofenac Sodium (VOLTAREN) 1 % GEL; Apply 4 g topically 4 (four) times daily.  Dispense: 150 g; Refill: 2  4. Chronic constipation  Increase miralax to three times a week   5. Encounter for screening mammogram for malignant neoplasm of breast  - MM Digital Screening; Future  6. Secondary dysmenorrhea  - ondansetron (ZOFRAN) 4 MG tablet; Take 1 tablet (4 mg total) by mouth every 8 (eight) hours as needed for nausea or vomiting.  Dispense: 20 tablet; Refill: 1  7. Endometriosis  - ondansetron (ZOFRAN) 4 MG tablet; Take 1 tablet (4 mg total) by mouth every 8 (eight) hours as needed for nausea or vomiting.  Dispense: 20 tablet; Refill: 1  I discussed the assessment and treatment plan with the patient. The patient was provided an opportunity to ask questions and all were answered. The patient agreed with the plan and demonstrated an understanding of the instructions.   The patient was advised to call back or seek an in-person evaluation if the symptoms worsen or if the  condition fails to improve as anticipated.  I provided 25  minutes of non-face-to-face time during this encounter.  Loistine Chance, MD

## 2020-01-17 ENCOUNTER — Ambulatory Visit: Payer: BC Managed Care – PPO | Admitting: Family Medicine

## 2020-03-19 DIAGNOSIS — J019 Acute sinusitis, unspecified: Secondary | ICD-10-CM | POA: Diagnosis not present

## 2020-04-03 ENCOUNTER — Other Ambulatory Visit: Payer: Self-pay

## 2020-04-03 ENCOUNTER — Ambulatory Visit
Admission: EM | Admit: 2020-04-03 | Discharge: 2020-04-03 | Disposition: A | Discharge status: Home / Self Care | Payer: BC Managed Care – PPO | Attending: Family Medicine | Admitting: Family Medicine

## 2020-04-03 ENCOUNTER — Encounter: Payer: Self-pay | Admitting: Emergency Medicine

## 2020-04-03 DIAGNOSIS — J0110 Acute frontal sinusitis, unspecified: Secondary | ICD-10-CM | POA: Diagnosis not present

## 2020-04-03 MED ORDER — DOXYCYCLINE HYCLATE 100 MG PO TABS: 100.0000 mg | ORAL_TABLET | Freq: Two times a day (BID) | ORAL | 0 refills | Status: DC

## 2020-04-03 NOTE — ED Provider Notes (Signed)
[facility]    CSN: [id number] Arrival date & time: 04/03/20  1938      History   Chief Complaint Chief Complaint  Patient presents with  . Sinus Problem    HPI Ana Knapp is a 39 y.o. female.   39 yo female with a c/o sinus pressure, headaches and nasal congestion for the past 2-3 weeks, associated with post nasal drainage and slight cough. Denies any fevers, chills, chest pains, shortness of breath. Recently given amoxicillin, however patient states symptoms did not improve. States can't take augmentin.    Sinus Problem    Past Medical History:  Diagnosis Date  . Chronic sinusitis   . Fibromyalgia   . GERD (gastroesophageal reflux disease)   . Headache    sinus  . History of melanoma 11/22/2017  . History of melanoma 2018   Back  . May-Thurner syndrome 11/30/2015   January 2017; 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[facility]   . PONV (postoperative nausea and vomiting)   . Pulmonary embolism [facility])     Patient Active Problem List   Diagnosis Date Noted  . Endometriosis 03/01/2018  . Abnormal CT scan   . History of melanoma 11/22/2017  . Biliary dyskinesia 12/05/2016  . Constipation 08/18/2016  . Vitamin B12 deficiency 08/18/2016  . Vitamin D deficiency 08/18/2016  . May-Thurner syndrome 11/30/2015  . DVT (deep venous thrombosis) ([place]) 11/13/2015  . History of pulmonary embolism 11/13/2015  . GERD (gastroesophageal reflux disease) 09/28/2015  . Flushing 09/24/2015  . Fibromyalgia 08/30/2015  . Seasonal allergic rhinitis 08/30/2015  . Alopecia 08/30/2015    Past Surgical History:  Procedure Laterality Date  . ANGIOPLASTY / STENTING FEMORAL  March 2017  . CHOLECYSTECTOMY    . COLONOSCOPY WITH PROPOFOL N/A 01/02/2018   Procedure: COLONOSCOPY WITH PROPOFOL;  Surgeon: Lucilla Lame, MD;  Location: [facility] ENDOSCOPY;  Service: Endoscopy;  Laterality: N/A;  . ESOPHAGOGASTRODUODENOSCOPY (EGD) WITH PROPOFOL N/A 01/02/2018   Procedure: ESOPHAGOGASTRODUODENOSCOPY (EGD)  WITH PROPOFOL;  Surgeon: Lucilla Lame, MD;  Location: [facility] ENDOSCOPY;  Service: Endoscopy;  Laterality: N/A;  . LAPAROSCOPY N/A 02/26/2018   Procedure: LAPAROSCOPY DIAGNOSTIC WITH BIOPSIES;  Surgeon: Rubie Maid, MD;  Location: [facility] ORS;  Service: Gynecology;  Laterality: N/A;  . Lytics Catheter Placement  11/16/15  . MELANOMA EXCISION  2018   Back  . ROBOTIC ASSISTED LAPAROSCOPIC CHOLECYSTECTOMY-SINGLE SITE  12/29/2016    OB History    Gravida  0   Para  0   Term  0   Preterm  0   AB  0   Living  0     SAB  0   TAB  0   Ectopic  0   Multiple  0   Live Births  0            Home Medications    Prior to Admission medications   Medication Sig Start Date End Date Taking? Authorizing Provider  DEXILANT 60 MG capsule TAKE ONE CAPSULE BY MOUTH EVERY NIGHT AT BEDTIME 09/30/19  Yes Wohl, Darren, MD  fluticasone (FLONASE SENSIMIST) 27.5 MCG/SPRAY nasal spray Place 2 sprays into the nose daily. 01/01/19  Yes Lada, Satira Anis, MD  levocetirizine (XYZAL) 5 MG tablet Take 1 tablet (5 mg total) by mouth every evening. 01/16/20  Yes Sowles, Drue Stager, MD  montelukast (SINGULAIR) 10 MG tablet Take 1 tablet (10 mg total) by mouth daily. 01/16/20  Yes Sowles, Drue Stager, MD  polyethylene glycol (MIRALAX / GLYCOLAX) packet Take 17 g by mouth daily.  Yes [provider]  diclofenac Sodium (VOLTAREN) 1 % GEL Apply 4 g topically 4 (four) times daily. 01/16/20   Steele Sizer, MD  doxycycline (VIBRA-TABS) 100 MG tablet Take 1 tablet (100 mg total) by mouth 2 (two) times daily. 04/03/20   Norval Gable, MD  ondansetron (ZOFRAN) 4 MG tablet Take 1 tablet (4 mg total) by mouth every 8 (eight) hours as needed for nausea or vomiting. 01/16/20   Steele Sizer, MD  Sod Fluoride-Potassium Nitrate (PREVIDENT 5000 SENSITIVE) 1.1-5 % PSTE Prevident 5000 Enamel Protect 1.1 %-5 % dental paste  BRUSH TEETH WITH PASTE FOR AT LEAST ONE MINUTE 2 TO 3 TIMES DAILY 11/17/19   [provider]     Family History Family History  Problem Relation Age of Onset  . Thyroid disease Mother   . Diabetes Mother   . Cancer Father        bladder  . Hypertension Maternal Grandmother   . Hypothyroidism Maternal Grandmother   . Thyroid disease Paternal Grandmother   . Diabetes Paternal Grandmother   . Stroke Paternal Grandmother   . Cancer Paternal Grandfather        skin  . Heart disease Neg Hx   . COPD Neg Hx     Social History Social History   Tobacco Use  . Smoking status: Never Smoker  . Smokeless tobacco: Never Used  Vaping Use  . Vaping Use: Never used  Substance Use Topics  . Alcohol use: Yes    Comment: rare  . Drug use: No     Allergies   Ortho tri-cyclen [norgestimate-eth estradiol], Prednisone, and Augmentin [amoxicillin-pot clavulanate]   Review of Systems Review of Systems   Physical Exam Triage Vital Signs ED Triage Vitals  Enc Vitals Group     BP 04/03/20 2007 120/80     Pulse Rate 04/03/20 2007 92     Resp 04/03/20 2007 14     Temp 04/03/20 2007 98.5 F (36.9 C)     Temp Source 04/03/20 2007 Oral     SpO2 04/03/20 2007 100 %     Weight 04/03/20 2004 150 lb (68 kg)     Height 04/03/20 2004 5\' 9"  (1.753 m)     Head Circumference --      Peak Flow --      Pain Score 04/03/20 2004 2     Pain Loc --      Pain Edu? --      Excl. in [place]? --    No data found.  Updated Vital Signs BP 120/80 (BP Location: Right Arm)   Pulse 92   Temp 98.5 F (36.9 C) (Oral)   Resp 14   Ht 5\' 9"  (1.753 m)   Wt 68 kg   LMP 03/20/2020 (Approximate)   SpO2 100%   BMI 22.15 kg/m   Visual Acuity Right Eye Distance:   Left Eye Distance:   Bilateral Distance:    Right Eye Near:   Left Eye Near:    Bilateral Near:     Physical Exam Vitals and nursing note reviewed.  Constitutional:      General: She is not in acute distress.    Appearance: She is not toxic-appearing or diaphoretic.  HENT:     Right Ear: Tympanic membrane normal.     Left Ear:  Tympanic membrane normal.     Nose: Congestion and rhinorrhea present.     Right Sinus: Frontal sinus tenderness present.     Left Sinus: Frontal sinus  tenderness present.     Mouth/Throat:     Pharynx: No oropharyngeal exudate or posterior oropharyngeal erythema.  Cardiovascular:     Heart sounds: Normal heart sounds.  Pulmonary:     Effort: Pulmonary effort is normal. No respiratory distress.     Breath sounds: Normal breath sounds. No stridor. No wheezing, rhonchi or rales.  Musculoskeletal:     Cervical back: Neck supple.  Neurological:     Mental Status: She is alert.      UC Treatments / Results  Labs (all labs ordered are listed, but only abnormal results are displayed) Labs Reviewed - No data to display  EKG   Radiology No results found.  Procedures Procedures (including critical care time)  Medications Ordered in UC Medications - No data to display  Initial Impression / Assessment and Plan / UC Course  I have reviewed the triage vital signs and the nursing notes.  Pertinent labs & imaging results that were available during my care of the patient were reviewed by me and considered in my medical decision making (see chart for details).      Final Clinical Impressions(s) / UC Diagnoses   Final diagnoses:  Acute frontal sinusitis, recurrence not specified     Discharge Instructions     Flonase nasal spray    ED Prescriptions    Medication Sig Dispense Auth. Provider   doxycycline (VIBRA-TABS) 100 MG tablet Take 1 tablet (100 mg total) by mouth 2 (two) times daily. 20 tablet Norval Gable, MD      1. diagnosis reviewed with patient 2. rx as per orders above; reviewed possible side effects, interactions, risks and benefits  3. Recommend supportive treatment with otc flonase, decongestant 4. Follow-up prn if symptoms worsen or don't improve   PDMP not reviewed this encounter.   Norval Gable, MD 04/03/20 2117

## 2020-04-03 NOTE — ED Triage Notes (Signed)
Patient c/o sinus congestion and pressure and runny nose and slight cough that started 2-3 weeks ago.  Patient denies fevers.

## 2020-04-03 NOTE — Discharge Instructions (Addendum)
Flonase nasal spray °

## 2020-04-15 ENCOUNTER — Telehealth: Payer: Self-pay

## 2020-04-15 NOTE — Telephone Encounter (Signed)
Dexilant was approved by patient insurance company

## 2020-04-15 NOTE — Telephone Encounter (Signed)
Received a fax from pharmacy that patient Dexilant needed a Prior authorization. Sent PA through cover my meds

## 2020-05-12 ENCOUNTER — Other Ambulatory Visit: Payer: Self-pay | Admitting: Gastroenterology

## 2020-07-04 ENCOUNTER — Ambulatory Visit
Admission: EM | Admit: 2020-07-04 | Discharge: 2020-07-04 | Disposition: A | Discharge status: Home / Self Care | Payer: 59 | Attending: Physician Assistant | Admitting: Physician Assistant

## 2020-07-04 ENCOUNTER — Other Ambulatory Visit: Payer: Self-pay

## 2020-07-04 ENCOUNTER — Encounter: Payer: Self-pay | Admitting: Emergency Medicine

## 2020-07-04 DIAGNOSIS — J069 Acute upper respiratory infection, unspecified: Secondary | ICD-10-CM

## 2020-07-04 DIAGNOSIS — Z20822 Contact with and (suspected) exposure to covid-19: Secondary | ICD-10-CM | POA: Insufficient documentation

## 2020-07-04 MED ORDER — BENZONATATE 100 MG PO CAPS: 100.0000 mg | ORAL_CAPSULE | Freq: Three times a day (TID) | ORAL | 0 refills | Status: DC

## 2020-07-04 MED ORDER — SALINE SPRAY 0.65 % NA SOLN: 1.0000 | NASAL | 0 refills | Status: DC | PRN

## 2020-07-04 MED ORDER — AZELASTINE HCL 0.1 % NA SOLN: 1.0000 | Freq: Two times a day (BID) | NASAL | 0 refills | Status: DC

## 2020-07-04 NOTE — ED Provider Notes (Signed)
[facility]    CSN: [id number] Arrival date & time: 07/04/20  1409      History   Chief Complaint Chief Complaint  Patient presents with  . Sinus Problem    HPI Ana Knapp is a 39 y.o. female.   Patient presents with 5 to 6 days in sinus congestion and facial pressure.  She reports symptoms started earlier this week.  Slight tickle in her throat and occasional cough that started yesterday.  Denies fevers or chills.  Denies difficulty breathing.  Cough nonproductive.  Has tried over-the-counter medications.  Denies nausea, vomiting or diarrhea.  Denies abdominal pain.  Denies changes of smell.  Did not receive Covid vaccines.  No known sick contacts.     Past Medical History:  Diagnosis Date  . Chronic sinusitis   . Fibromyalgia   . GERD (gastroesophageal reflux disease)   . Headache    sinus  . History of melanoma 11/22/2017  . History of melanoma 2018   Back  . May-Thurner syndrome 11/30/2015   January 2017; 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[facility]   . PONV (postoperative nausea and vomiting)   . Pulmonary embolism [facility])     Patient Active Problem List   Diagnosis Date Noted  . Endometriosis 03/01/2018  . Abnormal CT scan   . History of melanoma 11/22/2017  . Biliary dyskinesia 12/05/2016  . Constipation 08/18/2016  . Vitamin B12 deficiency 08/18/2016  . Vitamin D deficiency 08/18/2016  . May-Thurner syndrome 11/30/2015  . DVT (deep venous thrombosis) ([place]) 11/13/2015  . History of pulmonary embolism 11/13/2015  . GERD (gastroesophageal reflux disease) 09/28/2015  . Flushing 09/24/2015  . Fibromyalgia 08/30/2015  . Seasonal allergic rhinitis 08/30/2015  . Alopecia 08/30/2015    Past Surgical History:  Procedure Laterality Date  . ANGIOPLASTY / STENTING FEMORAL  March 2017  . CHOLECYSTECTOMY    . COLONOSCOPY WITH PROPOFOL N/A 01/02/2018   Procedure: COLONOSCOPY WITH PROPOFOL;  Surgeon: Lucilla Lame, MD;  Location: [facility] ENDOSCOPY;  Service: Endoscopy;   Laterality: N/A;  . ESOPHAGOGASTRODUODENOSCOPY (EGD) WITH PROPOFOL N/A 01/02/2018   Procedure: ESOPHAGOGASTRODUODENOSCOPY (EGD) WITH PROPOFOL;  Surgeon: Lucilla Lame, MD;  Location: [facility] ENDOSCOPY;  Service: Endoscopy;  Laterality: N/A;  . LAPAROSCOPY N/A 02/26/2018   Procedure: LAPAROSCOPY DIAGNOSTIC WITH BIOPSIES;  Surgeon: Rubie Maid, MD;  Location: [facility] ORS;  Service: Gynecology;  Laterality: N/A;  . Lytics Catheter Placement  11/16/15  . MELANOMA EXCISION  2018   Back  . ROBOTIC ASSISTED LAPAROSCOPIC CHOLECYSTECTOMY-SINGLE SITE  12/29/2016    OB History    Gravida  0   Para  0   Term  0   Preterm  0   AB  0   Living  0     SAB  0   TAB  0   Ectopic  0   Multiple  0   Live Births  0            Home Medications    Prior to Admission medications   Medication Sig Start Date End Date Taking? Authorizing Provider  dexlansoprazole (DEXILANT) 60 MG capsule Take 1 capsule (60 mg total) by mouth at bedtime. **PLEASE SCHEDULE A FOLLOW UP APPT** 05/12/20  Yes Wohl, Darren, MD  fluticasone (FLONASE SENSIMIST) 27.5 MCG/SPRAY nasal spray Place 2 sprays into the nose daily. 01/01/19  Yes Lada, Satira Anis, MD  levocetirizine (XYZAL) 5 MG tablet Take 1 tablet (5 mg total) by mouth every evening. 01/16/20  Yes Ancil Boozer, Drue Stager, MD  montelukast (SINGULAIR)  10 MG tablet Take 1 tablet (10 mg total) by mouth daily. 01/16/20  Yes Sowles, Drue Stager, MD  azelastine (ASTELIN) 0.1 % nasal spray Place 1 spray into both nostrils 2 (two) times daily. Use in each nostril as directed 07/04/20   Fareeda Downard, Marguerita Beards, PA-C  benzonatate (TESSALON) 100 MG capsule Take 1 capsule (100 mg total) by mouth every 8 (eight) hours. 07/04/20   Magnus Crescenzo, Marguerita Beards, PA-C  diclofenac Sodium (VOLTAREN) 1 % GEL Apply 4 g topically 4 (four) times daily. 01/16/20   Steele Sizer, MD  doxycycline (VIBRA-TABS) 100 MG tablet Take 1 tablet (100 mg total) by mouth 2 (two) times daily. 04/03/20   Norval Gable, MD  ondansetron (ZOFRAN) 4  MG tablet Take 1 tablet (4 mg total) by mouth every 8 (eight) hours as needed for nausea or vomiting. 01/16/20   Ancil Boozer, Drue Stager, MD  polyethylene glycol (MIRALAX / GLYCOLAX) packet Take 17 g by mouth daily.     [provider]  Sod Fluoride-Potassium Nitrate (PREVIDENT 5000 SENSITIVE) 1.1-5 % PSTE Prevident 5000 Enamel Protect 1.1 %-5 % dental paste  BRUSH TEETH WITH PASTE FOR AT LEAST ONE MINUTE 2 TO 3 TIMES DAILY 11/17/19   [provider]  sodium chloride (OCEAN) 0.65 % SOLN nasal spray Place 1 spray into both nostrils as needed for congestion. 07/04/20   Hawley Michel, Marguerita Beards, PA-C    Family History Family History  Problem Relation Age of Onset  . Thyroid disease Mother   . Diabetes Mother   . Cancer Father        bladder  . Hypertension Maternal Grandmother   . Hypothyroidism Maternal Grandmother   . Thyroid disease Paternal Grandmother   . Diabetes Paternal Grandmother   . Stroke Paternal Grandmother   . Cancer Paternal Grandfather        skin  . Heart disease Neg Hx   . COPD Neg Hx     Social History Social History   Tobacco Use  . Smoking status: Never Smoker  . Smokeless tobacco: Never Used  Vaping Use  . Vaping Use: Never used  Substance Use Topics  . Alcohol use: Yes    Comment: rare  . Drug use: No     Allergies   Ortho tri-cyclen [norgestimate-eth estradiol], Prednisone, and Augmentin [amoxicillin-pot clavulanate]   Review of Systems Review of Systems   Physical Exam Triage Vital Signs ED Triage Vitals  Enc Vitals Group     BP 07/04/20 1428 112/73     Pulse Rate 07/04/20 1428 96     Resp 07/04/20 1428 14     Temp 07/04/20 1428 98.7 F (37.1 C)     Temp Source 07/04/20 1428 Oral     SpO2 07/04/20 1428 100 %     Weight 07/04/20 1425 150 lb (68 kg)     Height 07/04/20 1425 5\' 9"  (1.753 m)     Head Circumference --      Peak Flow --      Pain Score 07/04/20 1425 0     Pain Loc --      Pain Edu? --      Excl. in [place]? --    No data  found.  Updated Vital Signs BP 112/73 (BP Location: Left Arm)   Pulse 96   Temp 98.7 F (37.1 C) (Oral)   Resp 14   Ht 5\' 9"  (1.753 m)   Wt 150 lb (68 kg)   LMP 06/13/2020 (Approximate)   SpO2 100%  BMI 22.15 kg/m   Visual Acuity Right Eye Distance:   Left Eye Distance:   Bilateral Distance:    Right Eye Near:   Left Eye Near:    Bilateral Near:     Physical Exam Vitals and nursing note reviewed.  Constitutional:      General: She is not in acute distress.    Appearance: She is well-developed. She is not ill-appearing.  HENT:     Head: Normocephalic and atraumatic.     Right Ear: Tympanic membrane normal.     Left Ear: Tympanic membrane normal.     Nose: Congestion present.     Mouth/Throat:     Mouth: Mucous membranes are moist.     Pharynx: Oropharynx is clear.  Eyes:     Extraocular Movements: Extraocular movements intact.     Conjunctiva/sclera: Conjunctivae normal.     Pupils: Pupils are equal, round, and reactive to light.  Cardiovascular:     Rate and Rhythm: Normal rate and regular rhythm.     Heart sounds: No murmur heard.   Pulmonary:     Effort: Pulmonary effort is normal. No respiratory distress.     Breath sounds: Normal breath sounds. No wheezing, rhonchi or rales.  Abdominal:     Palpations: Abdomen is soft.     Tenderness: There is no abdominal tenderness.  Musculoskeletal:     Cervical back: Neck supple.  Lymphadenopathy:     Cervical: No cervical adenopathy.  Skin:    General: Skin is warm and dry.  Neurological:     Mental Status: She is alert.      UC Treatments / Results  Labs (all labs ordered are listed, but only abnormal results are displayed) Labs Reviewed  SARS CORONAVIRUS 2 (TAT 6-24 HRS)    EKG   Radiology No results found.  Procedures Procedures (including critical care time)  Medications Ordered in UC Medications - No data to display  Initial Impression / Assessment and Plan / UC Course  I have reviewed  the triage vital signs and the nursing notes.  Pertinent labs & imaging results that were available during my care of the patient were reviewed by me and considered in my medical decision making (see chart for details).     #Viral URI Patient is a 39 year old presenting with viral upper respiratory symptoms.  Normal vital signs.  Afebrile.  Reassuring exam.  We will treat symptomatically.  Covid sent.  Discussed return, follow-up emergency room precautions.  Patient verbalized understand plan of care Final Clinical Impressions(s) / UC Diagnoses   Final diagnoses:  Viral URI     Discharge Instructions     Use nasal sprays as directed Continue your Xyzal as previously prescribed Take the cough medicine as prescribed  If symptoms not improving through the end of the week follow-up with this clinic or your primary care  Severe symptoms shortness of breath, high fevers or other concerning symptoms go to the emergency department  If your Covid-19 test is positive, you will receive a phone call from [facility] regarding your results. Negative test results are not called. Both positive and negative results area always visible on MyChart. If you do not have a MyChart account, sign up instructions are in your discharge papers.   Persons who are directed to care for themselves at home may discontinue isolation under the following conditions:  . At least 10 days have passed since symptom onset and . At least 24 hours have passed without running a  fever (this means without the use of fever-reducing medications) and . Other symptoms have improved.  Persons infected with COVID-19 who never develop symptoms may discontinue isolation and other precautions 10 days after the date of their first positive COVID-19 test.       ED Prescriptions    Medication Sig Dispense Auth. Provider   sodium chloride (OCEAN) 0.65 % SOLN nasal spray Place 1 spray into both nostrils as needed for congestion.  60 mL Ivah Girardot, Marguerita Beards, PA-C   azelastine (ASTELIN) 0.1 % nasal spray Place 1 spray into both nostrils 2 (two) times daily. Use in each nostril as directed 30 mL Sherlon Nied, Marguerita Beards, PA-C   benzonatate (TESSALON) 100 MG capsule Take 1 capsule (100 mg total) by mouth every 8 (eight) hours. 21 capsule Estreya Clay, Marguerita Beards, PA-C     PDMP not reviewed this encounter.   Purnell Shoemaker, PA-C 07/04/20 1759

## 2020-07-04 NOTE — Discharge Instructions (Signed)
Use nasal sprays as directed Continue your Xyzal as previously prescribed Take the cough medicine as prescribed  If symptoms not improving through the end of the week follow-up with this clinic or your primary care  Severe symptoms shortness of breath, high fevers or other concerning symptoms go to the emergency department  If your Covid-19 test is positive, you will receive a phone call from [facility] regarding your results. Negative test results are not called. Both positive and negative results area always visible on MyChart. If you do not have a MyChart account, sign up instructions are in your discharge papers.   Persons who are directed to care for themselves at home may discontinue isolation under the following conditions:   At least 10 days have passed since symptom onset and  At least 24 hours have passed without running a fever (this means without the use of fever-reducing medications) and  Other symptoms have improved.  Persons infected with COVID-19 who never develop symptoms may discontinue isolation and other precautions 10 days after the date of their first positive COVID-19 test.

## 2020-07-04 NOTE — ED Triage Notes (Signed)
Patient c/o sinus congestion and pressure that started 5-6 days ago.  Patient denies fevers.

## 2020-07-05 LAB — SARS CORONAVIRUS 2 (TAT 6-24 HRS): SARS Coronavirus 2: NEGATIVE

## 2020-07-08 ENCOUNTER — Encounter: Payer: Self-pay | Admitting: Family Medicine

## 2020-07-08 ENCOUNTER — Other Ambulatory Visit: Payer: Self-pay

## 2020-07-11 ENCOUNTER — Other Ambulatory Visit: Payer: Self-pay | Admitting: Gastroenterology

## 2020-07-13 ENCOUNTER — Telehealth: Payer: Self-pay | Admitting: Gastroenterology

## 2020-07-13 ENCOUNTER — Other Ambulatory Visit: Payer: Self-pay

## 2020-07-13 MED ORDER — DEXILANT 60 MG PO CPDR: 1.0000 | DELAYED_RELEASE_CAPSULE | Freq: Every day | ORAL | 3 refills | Status: DC

## 2020-07-13 NOTE — Telephone Encounter (Signed)
Pt notified medication was denied due to needing an office appt. Pt scheduled appt and refills given until her appt in November.

## 2020-07-13 NOTE — Telephone Encounter (Signed)
Patient states she went to pharmacy and medication dexilant was denied. Patient wanting to know what to do to get it approved, since it seems to help. Please advise.

## 2020-07-15 ENCOUNTER — Telehealth: Payer: Self-pay | Admitting: Gastroenterology

## 2020-07-15 NOTE — Telephone Encounter (Signed)
Patient called and stated that she needs a refill on Dexilant, she has ran out about 3 wks ago and wants Ginger to call her back per patient. Clinical staff was informed.

## 2020-07-15 NOTE — Telephone Encounter (Signed)
Dexilant prescription had been sent on 07/13/20. Contacted pt's pharmacy and was informed a prior authorization was required. Will work on getting medication approved.

## 2020-08-17 ENCOUNTER — Encounter: Payer: Self-pay | Admitting: Family Medicine

## 2020-08-17 ENCOUNTER — Other Ambulatory Visit: Payer: Self-pay

## 2020-08-17 ENCOUNTER — Ambulatory Visit (INDEPENDENT_AMBULATORY_CARE_PROVIDER_SITE_OTHER): Payer: Self-pay | Admitting: Family Medicine

## 2020-08-17 VITALS — Temp 96.4°F

## 2020-08-17 DIAGNOSIS — J302 Other seasonal allergic rhinitis: Secondary | ICD-10-CM

## 2020-08-17 DIAGNOSIS — N945 Secondary dysmenorrhea: Secondary | ICD-10-CM

## 2020-08-17 DIAGNOSIS — N809 Endometriosis, unspecified: Secondary | ICD-10-CM

## 2020-08-17 DIAGNOSIS — J3089 Other allergic rhinitis: Secondary | ICD-10-CM

## 2020-08-17 DIAGNOSIS — J329 Chronic sinusitis, unspecified: Secondary | ICD-10-CM

## 2020-08-17 MED ORDER — DOXYCYCLINE HYCLATE 100 MG PO CAPS: 100.0000 mg | ORAL_CAPSULE | Freq: Two times a day (BID) | ORAL | 0 refills | Status: DC

## 2020-08-17 MED ORDER — ONDANSETRON HCL 4 MG PO TABS: 4.0000 mg | ORAL_TABLET | Freq: Three times a day (TID) | ORAL | 1 refills | Status: DC | PRN

## 2020-08-17 NOTE — Progress Notes (Signed)
Name: Ana Knapp   MRN: [id number]    DOB: 1981/06/26   Date:08/17/2020       Progress Note  Subjective:    Chief Complaint  Chief Complaint  Patient presents with  . Sinusitis    nasal drainage, hoarse, cough, congested for 1 month    I connected with  [name] on 08/17/20 at 11:20 AM EDT by telephone and verified that I am speaking with the correct person using two identifiers.   I discussed the limitations, risks, security and privacy concerns of performing an evaluation and management service by telephone and the availability of in person appointments. Staff also discussed with the patient that there may be a patient responsible charge related to this service.  Patient verbalized understanding and agreed to proceed with encounter. Patient Location:  home Provider Location: [facility] clinic Additional Individuals present:   Acute on chronic rhinosinusitis, pt notes she gets sinus infections several times a year, is on allergy and sinus medicine year round, and previously was seeing ENT for it.  She had worsening sinus sx last month, went to UC, was tested for COVID and was negative, her sx persisted and have worsened.   Sinusitis This is a recurrent problem. The current episode started more than 1 month ago. The problem has been gradually worsening since onset. There has been no fever. The pain is moderate. Associated symptoms include congestion, coughing, a hoarse voice and sinus pressure. Pertinent negatives include no chills, diaphoresis, headaches, neck pain, shortness of breath, sneezing, sore throat or swollen glands. Past treatments include spray decongestants. The treatment provided no relief.    She also asks for a refill of zofran which she has taken for years for nausea that occurs with her menses   Patient Active Problem List   Diagnosis Date Noted  . Endometriosis 03/01/2018  . Abnormal CT scan   . History of melanoma 11/22/2017  . Biliary  dyskinesia 12/05/2016  . Constipation 08/18/2016  . Vitamin B12 deficiency 08/18/2016  . Vitamin D deficiency 08/18/2016  . May-Thurner syndrome 11/30/2015  . DVT (deep venous thrombosis) ([place]) 11/13/2015  . History of pulmonary embolism 11/13/2015  . GERD (gastroesophageal reflux disease) 09/28/2015  . Flushing 09/24/2015  . Fibromyalgia 08/30/2015  . Seasonal allergic rhinitis 08/30/2015  . Alopecia 08/30/2015    Social History   Tobacco Use  . Smoking status: Never Smoker  . Smokeless tobacco: Never Used  Substance Use Topics  . Alcohol use: Yes    Comment: rare     Current Outpatient Medications:  .  azelastine (ASTELIN) 0.1 % nasal spray, Place 1 spray into both nostrils 2 (two) times daily. Use in each nostril as directed, Disp: 30 mL, Rfl: 0 .  dexlansoprazole (DEXILANT) 60 MG capsule, Take 1 capsule (60 mg total) by mouth at bedtime., Disp: 30 capsule, Rfl: 3 .  diclofenac Sodium (VOLTAREN) 1 % GEL, Apply 4 g topically 4 (four) times daily., Disp: 150 g, Rfl: 2 .  fluticasone (FLONASE SENSIMIST) 27.5 MCG/SPRAY nasal spray, Place 2 sprays into the nose daily., Disp: , Rfl:  .  levocetirizine (XYZAL) 5 MG tablet, Take 1 tablet (5 mg total) by mouth every evening., Disp: 30 tablet, Rfl: 11 .  montelukast (SINGULAIR) 10 MG tablet, Take 1 tablet (10 mg total) by mouth daily., Disp: 30 tablet, Rfl: 11 .  ondansetron (ZOFRAN) 4 MG tablet, Take 1 tablet (4 mg total) by mouth every 8 (eight) hours as needed for nausea or vomiting., Disp:  20 tablet, Rfl: 1 .  polyethylene glycol (MIRALAX / GLYCOLAX) packet, Take 17 g by mouth daily. , Disp: , Rfl:  .  Sod Fluoride-Potassium Nitrate (PREVIDENT 5000 SENSITIVE) 1.1-5 % PSTE, Prevident 5000 Enamel Protect 1.1 %-5 % dental paste  BRUSH TEETH WITH PASTE FOR AT LEAST ONE MINUTE 2 TO 3 TIMES DAILY, Disp: , Rfl:  .  sodium chloride (OCEAN) 0.65 % SOLN nasal spray, Place 1 spray into both nostrils as needed for congestion., Disp: 60 mL, Rfl:  0 .  benzonatate (TESSALON) 100 MG capsule, Take 1 capsule (100 mg total) by mouth every 8 (eight) hours. (Patient not taking: Reported on 08/17/2020), Disp: 21 capsule, Rfl: 0 .  doxycycline (VIBRA-TABS) 100 MG tablet, Take 1 tablet (100 mg total) by mouth 2 (two) times daily. (Patient not taking: Reported on 08/17/2020), Disp: 20 tablet, Rfl: 0  Allergies  Allergen Reactions  . Ortho Tri-Cyclen [Norgestimate-Eth Estradiol] Other (See Comments)    Blood clot and PE  . Prednisone Anaphylaxis  . Augmentin [Amoxicillin-Pot Clavulanate] Other (See Comments)    Upsets her stomach    Chart Review: I personally reviewed active problem list, medication list, allergies, family history, social history, health maintenance, notes from last encounter, lab results, imaging with the patient/caregiver today.   Review of Systems  Constitutional: Negative.  Negative for chills and diaphoresis.  HENT: Positive for congestion, hoarse voice and sinus pressure. Negative for sneezing and sore throat.   Eyes: Negative.   Respiratory: Positive for cough. Negative for shortness of breath.   Cardiovascular: Negative.   Gastrointestinal: Negative.   Endocrine: Negative.   Genitourinary: Negative.   Musculoskeletal: Negative.  Negative for neck pain.  Skin: Negative.   Allergic/Immunologic: Negative.   Neurological: Negative.  Negative for headaches.  Hematological: Negative.   Psychiatric/Behavioral: Negative.   All other systems reviewed and are negative.    Objective:    Virtual encounter, vitals limited, only able to obtain the following Today's Vitals   08/17/20 1130  Temp: (!) 96.4 F (35.8 C)  TempSrc: Oral   There is no height or weight on file to calculate BMI. Nursing Note and Vital Signs reviewed.  Physical Exam Vitals and nursing note reviewed.  Neck:     Trachea: Phonation normal.  Pulmonary:     Comments: Occasional cough or throat clearing, able to speak in full and complete  sentences, no audible tachypnea, wheeze or stridor Neurological:     Mental Status: She is alert.  Psychiatric:        Mood and Affect: Mood normal.     PE limited by telephone encounter  No results found for this or any previous visit (from the past 72 hour(s)).  Assessment and Plan:     ICD-10-CM   1. Recurrent rhinosinusitis  J32.9    ongoing worsening sx than her baseline for more than a month, she feels she needs abx again, hx of the same, last abx use June 2021  2. Perennial allergic rhinitis with seasonal variation  J30.89    J30.2    continue xyzal, singulair and steroid nasal spray, encouraged adding additional antihistamine or decongestants in worse allergy seasons  3. Secondary dysmenorrhea  N94.5 ondansetron (ZOFRAN) 4 MG tablet   asks for med refill of zofran - reviewed chart and prior visits here, hx of zofran use for nausea sx associated with menses  4. Endometriosis  N80.9 ondansetron (ZOFRAN) 4 MG tablet   med refill, see #3    -Red flags and  when to present for emergency care or RTC including but not limited to new/worsening/un-resolving symptoms,  reviewed with patient at time of visit. Follow up and care instructions discussed and provided in AVS. - I discussed the assessment and treatment plan with the patient. The patient was provided an opportunity to ask questions and all were answered. The patient agreed with the plan and demonstrated an understanding of the instructions.  - The patient was advised to call back or seek an in-person evaluation if the symptoms worsen or if the condition fails to improve as anticipated.  I provided 20+ minutes of non-face-to-face time during this encounter.  Delsa Grana, PA-C 08/17/20 11:36 AM

## 2020-09-07 ENCOUNTER — Other Ambulatory Visit: Payer: Self-pay

## 2020-09-07 ENCOUNTER — Ambulatory Visit (INDEPENDENT_AMBULATORY_CARE_PROVIDER_SITE_OTHER): Payer: 59 | Admitting: Gastroenterology

## 2020-09-07 ENCOUNTER — Encounter: Payer: Self-pay | Admitting: Gastroenterology

## 2020-09-07 VITALS — BP 129/66 | HR 90 | Temp 97.4°F | Ht 69.0 in | Wt 166.4 lb

## 2020-09-07 DIAGNOSIS — K219 Gastro-esophageal reflux disease without esophagitis: Secondary | ICD-10-CM | POA: Diagnosis not present

## 2020-09-07 MED ORDER — ESOMEPRAZOLE MAGNESIUM 40 MG PO CPDR: 40.0000 mg | DELAYED_RELEASE_CAPSULE | Freq: Every day | ORAL | 10 refills | Status: DC

## 2020-09-07 NOTE — Progress Notes (Signed)
Primary Care Physician: Steele Sizer, MD  Primary Gastroenterologist:  Dr. Lucilla Lame  Chief Complaint  Patient presents with  . Gastroesophageal Reflux    HPI: Ana Knapp is a 39 y.o. female here For refill of her medications.  The patient has seen me in the past for chronic idiopathic constipation and was started on Dexilant at the having upper endoscopy. The patient was last seen by me in 2019 and cold recently to get a prescription refill.  The patient had her prescription refilled and was told that she needs to follow-up and is now here today to follow up.  The patient was also started on MiraLAX in the past for her chronic idiopathic constipation.  The patient reports that she had been doing well on the [place] and only needs to take MiraLAX once a week now.  She states that the insurance is not "cover her Dexilant and it is expensive for her.  The patient denies any unexplained weight loss fevers chills nausea vomiting black stools or bloody stools.  The patient has tried pantoprazole in the past and states that it did not work as well for her.  She has also tried Nexium in the past which she thinks is better than the pantoprazole but not as good as the Dexilant.    Past Medical History:  Diagnosis Date  . Chronic sinusitis   . Fibromyalgia   . GERD (gastroesophageal reflux disease)   . Headache    sinus  . History of melanoma 11/22/2017  . History of melanoma 2018   Back  . 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 PONV (postoperative nausea and vomiting)   . Pulmonary embolism ([place])     Current Outpatient Medications  Medication Sig Dispense Refill  . azelastine (ASTELIN) 0.1 % nasal spray Place 1 spray into both nostrils 2 (two) times daily. Use in each nostril as directed 30 mL 0  . dexlansoprazole (DEXILANT) 60 MG capsule Take 1 capsule (60 mg total) by mouth at bedtime. 30 capsule 3  . diclofenac Sodium (VOLTAREN) 1 %  GEL Apply 4 g topically 4 (four) times daily. 150 g 2  . fluticasone (FLONASE SENSIMIST) 27.5 MCG/SPRAY nasal spray Place 2 sprays into the nose daily.    . fluticasone (FLONASE) 50 MCG/ACT nasal spray Place 2 sprays into both nostrils daily.    Marland Kitchen levocetirizine (XYZAL) 5 MG tablet Take 1 tablet (5 mg total) by mouth every evening. 30 tablet 11  . montelukast (SINGULAIR) 10 MG tablet Take 1 tablet (10 mg total) by mouth daily. 30 tablet 11  . ondansetron (ZOFRAN) 4 MG tablet Take 1 tablet (4 mg total) by mouth every 8 (eight) hours as needed for nausea or vomiting. 20 tablet 1  . polyethylene glycol (MIRALAX / GLYCOLAX) packet Take 17 g by mouth daily.     . Sod Fluoride-Potassium Nitrate (PREVIDENT 5000 SENSITIVE) 1.1-5 % PSTE Prevident 5000 Enamel Protect 1.1 %-5 % dental paste  BRUSH TEETH WITH PASTE FOR AT LEAST ONE MINUTE 2 TO 3 TIMES DAILY     No current facility-administered medications for this visit.    Allergies as of 09/07/2020 - Review Complete 09/07/2020  Allergen Reaction Noted  . Ortho tri-cyclen [norgestimate-eth estradiol] Other (See Comments) 11/30/2015  . Prednisone Anaphylaxis 11/12/2015  . Augmentin [amoxicillin-pot clavulanate] Other (See Comments) 08/26/2015    ROS:  General: Negative for anorexia, weight loss, fever, chills, fatigue, weakness. ENT: Negative for hoarseness,  difficulty swallowing , nasal congestion. CV: Negative for chest pain, angina, palpitations, dyspnea on exertion, peripheral edema.  Respiratory: Negative for dyspnea at rest, dyspnea on exertion, cough, sputum, wheezing.  GI: See history of present illness. GU:  Negative for dysuria, hematuria, urinary incontinence, urinary frequency, nocturnal urination.  Endo: Negative for unusual weight change.    Physical Examination:   BP 129/66 (BP Location: Left Arm, Patient Position: Sitting, Cuff Size: Normal)   Pulse 90   Temp (!) 97.4 F (36.3 C) (Oral)   Ht 5\' 9"  (1.753 m)   Wt 166 lb 6 oz  (75.5 kg)   BMI 24.57 kg/m   General: Well-nourished, well-developed in no acute distress.  Eyes: No icterus. Conjunctivae pink. Lungs: Clear to auscultation bilaterally. Non-labored. Heart: Regular rate and rhythm, no murmurs rubs or gallops.  Abdomen: Bowel sounds are normal, nontender, nondistended, no hepatosplenomegaly or masses, no abdominal bruits or hernia , no rebound or guarding.   Extremities: No lower extremity edema. No clubbing or deformities. Neuro: Alert and oriented x 3.  Grossly intact. Skin: Warm and dry, no jaundice.   Psych: Alert and cooperative, normal mood and affect.  Labs:    Imaging Studies: No results found.  Assessment and Plan:   Ana Knapp is a 39 y.o. y/o female who comes in today for a refill of her medications.  The patient has no worry symptoms such as dysphagia unexplained weight loss black stools or bloody stools.  The patient has been helped with Dexilant but it is not covered by her insurance.  The patient would rather retry the Nexium then the pantoprazole because she feels that it worked better for her in the past.  The patient will be given a prescription for the Nexium and will contact me if her symptoms do not improve and she would like to go back on the [place].  The patient has been explained the plan agrees with it.     Lucilla Lame, MD. Marval Regal    Note: This dictation was prepared with Dragon dictation along with smaller phrase technology. Any transcriptional errors that result from this process are unintentional.

## 2020-11-10 ENCOUNTER — Encounter: Payer: Self-pay | Admitting: Emergency Medicine

## 2020-11-10 ENCOUNTER — Ambulatory Visit
Admission: EM | Admit: 2020-11-10 | Discharge: 2020-11-10 | Disposition: A | Discharge status: Home / Self Care | Payer: 59 | Attending: Family Medicine | Admitting: Family Medicine

## 2020-11-10 ENCOUNTER — Other Ambulatory Visit: Payer: Self-pay

## 2020-11-10 DIAGNOSIS — U071 COVID-19: Secondary | ICD-10-CM | POA: Insufficient documentation

## 2020-11-10 DIAGNOSIS — R059 Cough, unspecified: Secondary | ICD-10-CM | POA: Diagnosis not present

## 2020-11-10 DIAGNOSIS — R509 Fever, unspecified: Secondary | ICD-10-CM | POA: Diagnosis present

## 2020-11-10 DIAGNOSIS — B349 Viral infection, unspecified: Secondary | ICD-10-CM | POA: Insufficient documentation

## 2020-11-10 DIAGNOSIS — J029 Acute pharyngitis, unspecified: Secondary | ICD-10-CM | POA: Diagnosis not present

## 2020-11-10 MED ORDER — PSEUDOEPH-BROMPHEN-DM 30-2-10 MG/5ML PO SYRP: 10.0000 mL | ORAL_SOLUTION | Freq: Four times a day (QID) | ORAL | 0 refills | Status: AC | PRN

## 2020-11-10 NOTE — ED Triage Notes (Signed)
Pt c/o cough, fever (102), nasal congestion and runny nose. Started about 3 days ago.

## 2020-11-10 NOTE — ED Provider Notes (Signed)
[facility]    CSN: [id number] Arrival date & time: 11/10/20  1226      History   Chief Complaint Chief Complaint  Patient presents with  . Fever  . Cough    HPI Ana Knapp is a 40 y.o. female presenting for 3 day history of fever up to 102 degrees, cough, and nasal congestion with drainage.  Patient also admits to mild sore throat.  She says that she actually feels better today than she did at onset of symptoms.  Has been taking over-the-counter cough syrups and Tylenol.  Patient states she would like something stronger for other cough.  She denies any chest pain or breathing difficulty.  Denies any nausea/vomiting or diarrhea. Denies known COVID exposure and not vaccinated for COVID 19.  Patient denies any history of asthma or heart problems.  No other concerns today.  HPI  Past Medical History:  Diagnosis Date  . Chronic sinusitis   . Fibromyalgia   . GERD (gastroesophageal reflux disease)   . Headache    sinus  . History of melanoma 11/22/2017  . History of melanoma 2018   Back  . 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 PONV (postoperative nausea and vomiting)   . Pulmonary embolism [facility])     Patient Active Problem List   Diagnosis Date Noted  . Endometriosis 03/01/2018  . Abnormal CT scan   . History of melanoma 11/22/2017  . Biliary dyskinesia 12/05/2016  . Constipation 08/18/2016  . Vitamin B12 deficiency 08/18/2016  . Vitamin D deficiency 08/18/2016  . May-Thurner syndrome 11/30/2015  . DVT (deep venous thrombosis) ([place]) 11/13/2015  . History of pulmonary embolism 11/13/2015  . GERD (gastroesophageal reflux disease) 09/28/2015  . Flushing 09/24/2015  . Fibromyalgia 08/30/2015  . Seasonal allergic rhinitis 08/30/2015  . Alopecia 08/30/2015    Past Surgical History:  Procedure Laterality Date  . ANGIOPLASTY / STENTING FEMORAL  March 2017  . CHOLECYSTECTOMY    . COLONOSCOPY WITH PROPOFOL N/A  01/02/2018   Procedure: COLONOSCOPY WITH PROPOFOL;  Surgeon: Lucilla Lame, MD;  Location: [facility] ENDOSCOPY;  Service: Endoscopy;  Laterality: N/A;  . ESOPHAGOGASTRODUODENOSCOPY (EGD) WITH PROPOFOL N/A 01/02/2018   Procedure: ESOPHAGOGASTRODUODENOSCOPY (EGD) WITH PROPOFOL;  Surgeon: Lucilla Lame, MD;  Location: [facility] ENDOSCOPY;  Service: Endoscopy;  Laterality: N/A;  . LAPAROSCOPY N/A 02/26/2018   Procedure: LAPAROSCOPY DIAGNOSTIC WITH BIOPSIES;  Surgeon: Rubie Maid, MD;  Location: [facility] ORS;  Service: Gynecology;  Laterality: N/A;  . Lytics Catheter Placement  11/16/15  . MELANOMA EXCISION  2018   Back  . ROBOTIC ASSISTED LAPAROSCOPIC CHOLECYSTECTOMY-SINGLE SITE  12/29/2016    OB History    Gravida  0   Para  0   Term  0   Preterm  0   AB  0   Living  0     SAB  0   IAB  0   Ectopic  0   Multiple  0   Live Births  0            Home Medications    Prior to Admission medications   Medication Sig Start Date End Date Taking? Authorizing Provider  brompheniramine-pseudoephedrine-DM 30-2-10 MG/5ML syrup Take 10 mLs by mouth 4 (four) times daily as needed for up to 7 days. 11/10/20 11/17/20 Yes Danton Clap, PA-C  esomeprazole (NEXIUM) 40 MG capsule Take 1 capsule (40 mg total) by mouth daily before breakfast. 09/07/20  Yes Lucilla Lame, MD  levocetirizine (XYZAL) 5 MG tablet Take 1 tablet (5 mg total) by mouth every evening. 01/16/20  Yes Sowles, Drue Stager, MD  montelukast (SINGULAIR) 10 MG tablet Take 1 tablet (10 mg total) by mouth daily. 01/16/20  Yes Sowles, Drue Stager, MD  polyethylene glycol (MIRALAX / GLYCOLAX) packet Take 17 g by mouth daily.    Yes [provider]  azelastine (ASTELIN) 0.1 % nasal spray Place 1 spray into both nostrils 2 (two) times daily. Use in each nostril as directed 07/04/20   Darr, Edison Nasuti, PA-C  dexlansoprazole (DEXILANT) 60 MG capsule Take 1 capsule (60 mg total) by mouth at bedtime. 07/13/20   Lucilla Lame, MD  diclofenac Sodium (VOLTAREN) 1  % GEL Apply 4 g topically 4 (four) times daily. 01/16/20   Sowles, Drue Stager, MD  fluticasone (FLONASE SENSIMIST) 27.5 MCG/SPRAY nasal spray Place 2 sprays into the nose daily. 01/01/19   Lada, Satira Anis, MD  fluticasone (FLONASE) 50 MCG/ACT nasal spray Place 2 sprays into both nostrils daily. 07/03/20   [provider]  ondansetron (ZOFRAN) 4 MG tablet Take 1 tablet (4 mg total) by mouth every 8 (eight) hours as needed for nausea or vomiting. 08/17/20   Delsa Grana, PA-C  Sod Fluoride-Potassium Nitrate (PREVIDENT 5000 SENSITIVE) 1.1-5 % PSTE Prevident 5000 Enamel Protect 1.1 %-5 % dental paste  BRUSH TEETH WITH PASTE FOR AT LEAST ONE MINUTE 2 TO 3 TIMES DAILY 11/17/19   [provider]    Family History Family History  Problem Relation Age of Onset  . Thyroid disease Mother   . Diabetes Mother   . Cancer Father        bladder  . Hypertension Maternal Grandmother   . Hypothyroidism Maternal Grandmother   . Thyroid disease Paternal Grandmother   . Diabetes Paternal Grandmother   . Stroke Paternal Grandmother   . Cancer Paternal Grandfather        skin  . Heart disease Neg Hx   . COPD Neg Hx     Social History Social History   Tobacco Use  . Smoking status: Never Smoker  . Smokeless tobacco: Never Used  Vaping Use  . Vaping Use: Never used  Substance Use Topics  . Alcohol use: Yes    Comment: rare  . Drug use: No     Allergies   Ortho tri-cyclen [norgestimate-eth estradiol], Prednisone, and Augmentin [amoxicillin-pot clavulanate]   Review of Systems Review of Systems  Constitutional: Positive for fatigue. Negative for chills, diaphoresis and fever.  HENT: Positive for congestion, rhinorrhea and sore throat. Negative for ear pain, sinus pressure and sinus pain.   Respiratory: Positive for cough. Negative for shortness of breath.   Gastrointestinal: Negative for abdominal pain, nausea and vomiting.  Musculoskeletal: Negative for arthralgias and myalgias.   Skin: Negative for rash.  Neurological: Negative for weakness and headaches.  Hematological: Negative for adenopathy.     Physical Exam Triage Vital Signs ED Triage Vitals  Enc Vitals Group     BP 11/10/20 1347 124/88     Pulse Rate 11/10/20 1347 81     Resp 11/10/20 1347 18     Temp 11/10/20 1347 98.6 F (37 C)     Temp Source 11/10/20 1347 Oral     SpO2 11/10/20 1347 99 %     Weight 11/10/20 1344 166 lb 7.2 oz (75.5 kg)     Height 11/10/20 1344 5\' 9"  (1.753 m)     Head Circumference --      Peak Flow --  Pain Score 11/10/20 1344 0     Pain Loc --      Pain Edu? --      Excl. in [place]? --    No data found.  Updated Vital Signs BP 124/88 (BP Location: Left Arm)   Pulse 81   Temp 98.6 F (37 C) (Oral)   Resp 18   Ht 5\' 9"  (1.753 m)   Wt 166 lb 7.2 oz (75.5 kg)   LMP 11/02/2020 (Approximate)   SpO2 99%   BMI 24.58 kg/m       Physical Exam Vitals and nursing note reviewed.  Constitutional:      General: She is not in acute distress.    Appearance: Normal appearance. She is not ill-appearing or toxic-appearing.  HENT:     Head: Normocephalic and atraumatic.     Nose: Congestion and rhinorrhea present.     Mouth/Throat:     Mouth: Mucous membranes are moist.     Pharynx: Oropharynx is clear. Posterior oropharyngeal erythema present.  Eyes:     General: No scleral icterus.       Right eye: No discharge.        Left eye: No discharge.     Conjunctiva/sclera: Conjunctivae normal.  Cardiovascular:     Rate and Rhythm: Normal rate and regular rhythm.     Heart sounds: Normal heart sounds.  Pulmonary:     Effort: Pulmonary effort is normal. No respiratory distress.     Breath sounds: Normal breath sounds.  Musculoskeletal:     Cervical back: Neck supple.  Skin:    General: Skin is dry.  Neurological:     General: No focal deficit present.     Mental Status: She is alert. Mental status is at baseline.     Motor: No weakness.     Gait: Gait normal.   Psychiatric:        Mood and Affect: Mood normal.        Behavior: Behavior normal.        Thought Content: Thought content normal.      UC Treatments / Results  Labs (all labs ordered are listed, but only abnormal results are displayed) Labs Reviewed  SARS CORONAVIRUS 2 (TAT 6-24 HRS)    EKG   Radiology No results found.  Procedures Procedures (including critical care time)  Medications Ordered in UC Medications - No data to display  Initial Impression / Assessment and Plan / UC Course  I have reviewed the triage vital signs and the nursing notes.  Pertinent labs & imaging results that were available during my care of the patient were reviewed by me and considered in my medical decision making (see chart for details).   Suspect viral illness, possibly COVID-19.  Current CDC guidelines, isolation protocol and ED precautions reviewed patient.  Sent Bromfed-DM to pharmacy.  Advised to increase rest and fluids.  Work note provided for the next couple days.  Advised to follow-up with her clinic as needed.   Final Clinical Impressions(s) / UC Diagnoses   Final diagnoses:  Viral illness  Cough  Fever, unspecified     Discharge Instructions     URI/COLD SYMPTOMS: Your exam today is consistent with a viral illness. Antibiotics are not indicated at this time. Use medications as directed, including cough syrup, nasal saline, and decongestants. Your symptoms should improve over the next few days and resolve within 7-10 days. Increase rest and fluids. F/u if symptoms worsen or predominate such as sore throat,  ear pain, productive cough, shortness of breath, or if you develop high fevers or worsening fatigue over the next several days.    You have received COVID testing today either for positive exposure, concerning symptoms that could be related to COVID infection, screening purposes, or re-testing after confirmed positive.  Your test obtained today checks for active viral  infection in the last 1-2 weeks. If your test is negative now, you can still test positive later. So, if you do develop symptoms you should either get re-tested and/or isolate x 5 days and then strict mask use x 5 days (unvaccinated) or mask use x 10 days (vaccinated). Please follow CDC guidelines.  While Rapid antigen tests come back in 15-20 minutes, send out PCR/molecular test results typically come back within 1-3 days. In the mean time, if you are symptomatic, assume this could be a positive test and treat/monitor yourself as if you do have COVID.   We will call with test results if positive. Please download the MyChart app and set up a profile to access test results.   If symptomatic, go home and rest. Push fluids. Take Tylenol as needed for discomfort. Gargle warm salt water. Throat lozenges. Take Mucinex DM or Robitussin for cough. Humidifier in bedroom to ease coughing. Warm showers. Also review the COVID handout for more information.  COVID-19 INFECTION: The incubation period of COVID-19 is approximately 14 days after exposure, with most symptoms developing in roughly 4-5 days. Symptoms may range in severity from mild to critically severe. Roughly 80% of those infected will have mild symptoms. People of any age may become infected with COVID-19 and have the ability to transmit the virus. The most common symptoms include: fever, fatigue, cough, body aches, headaches, sore throat, nasal congestion, shortness of breath, nausea, vomiting, diarrhea, changes in smell and/or taste.    COURSE OF ILLNESS Some patients may begin with mild disease which can progress quickly into critical symptoms. If your symptoms are worsening please call ahead to the Emergency Department and proceed there for further treatment. Recovery time appears to be roughly 1-2 weeks for mild symptoms and 3-6 weeks for severe disease.   GO IMMEDIATELY TO ER FOR FEVER YOU ARE UNABLE TO GET DOWN WITH TYLENOL, BREATHING PROBLEMS,  CHEST PAIN, FATIGUE, LETHARGY, INABILITY TO EAT OR DRINK, ETC  QUARANTINE AND ISOLATION: To help decrease the spread of COVID-19 please remain isolated if you have COVID infection or are highly suspected to have COVID infection. This means -stay home and isolate to one room in the home if you live with others. Do not share a bed or bathroom with others while ill, sanitize and wipe down all countertops and keep common areas clean and disinfected. Stay home for 5 days. If you have no symptoms or your symptoms are resolving after 5 days, you can leave your house. Continue to wear a mask around others for 5 additional days. If you have been in close contact (within 6 feet) of someone diagnosed with COVID 19, you are advised to quarantine in your home for 14 days as symptoms can develop anywhere from 2-14 days after exposure to the virus. If you develop symptoms, you  must isolate.  Most current guidelines for COVID after exposure -unvaccinated: isolate 5 days and strict mask use x 5 days. Test on day 5 is possible -vaccinated: wear mask x 10 days if symptoms do not develop -You do not necessarily need to be tested for COVID if you have + exposure and  develop  symptoms. Just isolate at home x10 days from symptom onset During this global pandemic, CDC advises to practice social distancing, try to stay at least 60ft away from others at all times. Wear a face covering. Wash and sanitize your hands regularly and avoid going anywhere that is not necessary.  KEEP IN MIND THAT THE COVID TEST IS NOT 100% ACCURATE AND YOU SHOULD STILL DO EVERYTHING TO PREVENT POTENTIAL SPREAD OF VIRUS TO OTHERS (WEAR MASK, WEAR GLOVES, [place] HANDS AND SANITIZE REGULARLY). IF INITIAL TEST IS NEGATIVE, THIS MAY NOT MEAN YOU ARE DEFINITELY NEGATIVE. MOST ACCURATE TESTING IS DONE 5-7 DAYS AFTER EXPOSURE.   It is not advised by CDC to get re-tested after receiving a positive COVID test since you can still test positive for weeks to months  after you have already cleared the virus.   *If you have not been vaccinated for COVID, I strongly suggest you consider getting vaccinated as long as there are no contraindications.      ED Prescriptions    Medication Sig Dispense Auth. Provider   brompheniramine-pseudoephedrine-DM 30-2-10 MG/5ML syrup Take 10 mLs by mouth 4 (four) times daily as needed for up to 7 days. 150 mL Danton Clap, PA-C     PDMP not reviewed this encounter.   Danton Clap, PA-C 11/10/20 1436

## 2020-11-10 NOTE — Discharge Instructions (Signed)

## 2020-11-11 LAB — SARS CORONAVIRUS 2 (TAT 6-24 HRS): SARS Coronavirus 2: POSITIVE — AB

## 2020-11-12 ENCOUNTER — Telehealth (HOSPITAL_COMMUNITY): Payer: Self-pay | Admitting: Family

## 2020-11-12 NOTE — Telephone Encounter (Signed)
Called to discuss with patient about COVID-19 symptoms and the use of one of the available treatments for those with mild to moderate Covid symptoms and at a high risk of hospitalization.  Pt appears to qualify for outpatient treatment due to co-morbid conditions and/or a member of an at-risk group in accordance with the FDA Emergency Use Authorization.    Unable to reach pt - VM left  Ana Knapp   

## 2020-11-26 ENCOUNTER — Encounter: Payer: Self-pay | Admitting: Family Medicine

## 2020-11-27 ENCOUNTER — Encounter: Payer: Self-pay | Admitting: Family Medicine

## 2020-11-27 ENCOUNTER — Other Ambulatory Visit: Payer: Self-pay

## 2020-11-27 ENCOUNTER — Other Ambulatory Visit: Payer: Self-pay | Admitting: Family Medicine

## 2020-11-27 ENCOUNTER — Telehealth (INDEPENDENT_AMBULATORY_CARE_PROVIDER_SITE_OTHER): Payer: 59 | Admitting: Family Medicine

## 2020-11-27 VITALS — Ht 69.0 in | Wt 166.0 lb

## 2020-11-27 DIAGNOSIS — J321 Chronic frontal sinusitis: Secondary | ICD-10-CM

## 2020-11-27 MED ORDER — AZELASTINE HCL 0.1 % NA SOLN: 1.0000 | Freq: Two times a day (BID) | NASAL | 0 refills | Status: DC

## 2020-11-27 MED ORDER — DOXYCYCLINE HYCLATE 100 MG PO TABS: 100.0000 mg | ORAL_TABLET | Freq: Two times a day (BID) | ORAL | 0 refills | Status: DC

## 2020-11-27 NOTE — Progress Notes (Signed)
Name: Ana Knapp   MRN: [id number]    DOB: [birth year]   Date:11/27/2020       Progress Note  Subjective  Chief Complaint  Chief Complaint  Patient presents with  . Follow-up  . covid long haul  . Sinusitis    I connected with  [name] on 11/27/20 at 12:00 PM EST by telephone and verified that I am speaking with the correct person using two identifiers.  I discussed the limitations, risks, security and privacy concerns of performing an evaluation and management service by telephone and the availability of in person appointments. Staff also discussed with the patient that there may be a patient responsible charge related to this service. Patient agreed on having a virtual visit  Patient Location: at work Provider Location: [facility]  Additional Individuals present: students in and out   HPI  COVID positive test on 11/11/2019. During the illness she had fever, chills, mild nausea, headaches, cough . No lack of sense or smell or taste. She states she also has nasal,  Congestion, rhinorrhea and sneezing  due to allergies, however over the past few days, having more nasal congestion and post-nasal drainage that is green. She states felt better and now getting sick again, usually has sinus infections after a viral illness.   Patient Active Problem List   Diagnosis Date Noted  . Endometriosis 03/01/2018  . Abnormal CT scan   . History of melanoma 11/22/2017  . Biliary dyskinesia 12/05/2016  . Constipation 08/18/2016  . Vitamin B12 deficiency 08/18/2016  . Vitamin D deficiency 08/18/2016  . May-Thurner syndrome 11/30/2015  . DVT (deep venous thrombosis) ([place]) 11/13/2015  . History of pulmonary embolism 11/13/2015  . GERD (gastroesophageal reflux disease) 09/28/2015  . Flushing 09/24/2015  . Fibromyalgia 08/30/2015  . Seasonal allergic rhinitis 08/30/2015  . Alopecia 08/30/2015    Past Surgical History:  Procedure Laterality Date  . ANGIOPLASTY / STENTING FEMORAL   March 2017  . CHOLECYSTECTOMY    . COLONOSCOPY WITH PROPOFOL N/A 01/02/2018   Procedure: COLONOSCOPY WITH PROPOFOL;  Surgeon: Lucilla Lame, MD;  Location: [facility] ENDOSCOPY;  Service: Endoscopy;  Laterality: N/A;  . ESOPHAGOGASTRODUODENOSCOPY (EGD) WITH PROPOFOL N/A 01/02/2018   Procedure: ESOPHAGOGASTRODUODENOSCOPY (EGD) WITH PROPOFOL;  Surgeon: Lucilla Lame, MD;  Location: [facility] ENDOSCOPY;  Service: Endoscopy;  Laterality: N/A;  . LAPAROSCOPY N/A 02/26/2018   Procedure: LAPAROSCOPY DIAGNOSTIC WITH BIOPSIES;  Surgeon: Rubie Maid, MD;  Location: [facility] ORS;  Service: Gynecology;  Laterality: N/A;  . Lytics Catheter Placement  11/16/15  . MELANOMA EXCISION  2018   Back  . ROBOTIC ASSISTED LAPAROSCOPIC CHOLECYSTECTOMY-SINGLE SITE  12/29/2016    Family History  Problem Relation Age of Onset  . Thyroid disease Mother   . Diabetes Mother   . Cancer Father        bladder  . Hypertension Maternal Grandmother   . Hypothyroidism Maternal Grandmother   . Thyroid disease Paternal Grandmother   . Diabetes Paternal Grandmother   . Stroke Paternal Grandmother   . Cancer Paternal Grandfather        skin  . Heart disease Neg Hx   . COPD Neg Hx     Social History   Socioeconomic History  . Marital status: Married    Spouse name: Not on file  . Number of children: Not on file  . Years of education: Not on file  . Highest education level: Not on file  Occupational History  . Not on file  Tobacco Use  .  Smoking status: Never Smoker  . Smokeless tobacco: Never Used  Vaping Use  . Vaping Use: Never used  Substance and Sexual Activity  . Alcohol use: Yes    Comment: rare  . Drug use: No  . Sexual activity: Not Currently    Birth control/protection: None  Other Topics Concern  . Not on file  Social History Narrative  . Not on file   Social Determinants of Health   Financial Resource Strain: Not on file  Food Insecurity: Not on file  Transportation Needs: Not on file  Physical Activity:  Not on file  Stress: Not on file  Social Connections: Not on file  Intimate Partner Violence: Not on file     Current Outpatient Medications:  .  azelastine (ASTELIN) 0.1 % nasal spray, Place 1 spray into both nostrils 2 (two) times daily. Use in each nostril as directed, Disp: 30 mL, Rfl: 0 .  diclofenac Sodium (VOLTAREN) 1 % GEL, Apply 4 g topically 4 (four) times daily., Disp: 150 g, Rfl: 2 .  esomeprazole (NEXIUM) 40 MG capsule, Take 1 capsule (40 mg total) by mouth daily before breakfast., Disp: 30 capsule, Rfl: 10 .  fluticasone (FLONASE SENSIMIST) 27.5 MCG/SPRAY nasal spray, Place 2 sprays into the nose daily., Disp: , Rfl:  .  fluticasone (FLONASE) 50 MCG/ACT nasal spray, Place 2 sprays into both nostrils daily., Disp: , Rfl:  .  levocetirizine (XYZAL) 5 MG tablet, Take 1 tablet (5 mg total) by mouth every evening., Disp: 30 tablet, Rfl: 11 .  montelukast (SINGULAIR) 10 MG tablet, Take 1 tablet (10 mg total) by mouth daily., Disp: 30 tablet, Rfl: 11 .  ondansetron (ZOFRAN) 4 MG tablet, Take 1 tablet (4 mg total) by mouth every 8 (eight) hours as needed for nausea or vomiting., Disp: 20 tablet, Rfl: 1 .  polyethylene glycol (MIRALAX / GLYCOLAX) packet, Take 17 g by mouth daily. , Disp: , Rfl:  .  Sod Fluoride-Potassium Nitrate (PREVIDENT 5000 SENSITIVE) 1.1-5 % PSTE, Prevident 5000 Enamel Protect 1.1 %-5 % dental paste  BRUSH TEETH WITH PASTE FOR AT LEAST ONE MINUTE 2 TO 3 TIMES DAILY, Disp: , Rfl:  .  dexlansoprazole (DEXILANT) 60 MG capsule, Take 1 capsule (60 mg total) by mouth at bedtime. (Patient not taking: Reported on 11/27/2020), Disp: 30 capsule, Rfl: 3  Allergies  Allergen Reactions  . Ortho Tri-Cyclen [Norgestimate-Eth Estradiol] Other (See Comments)    Blood clot and PE  . Prednisone Anaphylaxis  . Augmentin [Amoxicillin-Pot Clavulanate] Other (See Comments)    Upsets her stomach    I personally reviewed active problem list, medication list, allergies, family history,  social history with the patient/caregiver today.   ROS  Ten systems reviewed and is negative except as mentioned in HPI   Objective  Virtual encounter, vitals not obtained.  Body mass index is 24.51 kg/m.  Physical Exam  Awake, alert and oriented  PHQ2/9: Depression screen [facility] 2/9 11/27/2020 08/17/2020 01/16/2020 07/08/2019 04/01/2019  Decreased Interest 0 0 0 0 1  Down, Depressed, Hopeless 3 0 0 0 0  PHQ - 2 Score 3 0 0 0 1  Altered sleeping 0 - 0 0 0  Tired, decreased energy 0 - 0 0 1  Change in appetite 0 - 0 0 0  Feeling bad or failure about yourself  0 - 0 0 0  Trouble concentrating 0 - 0 0 0  Moving slowly or fidgety/restless 0 - 0 0 0  Suicidal thoughts 0 - 0 0  0  PHQ-9 Score 3 - 0 0 2  Difficult doing work/chores Not difficult at all - Not difficult at all Not difficult at all Not difficult at all   PHQ-2/9 Result is negative.    Fall Risk: Fall Risk  11/27/2020 08/17/2020 01/16/2020 07/08/2019 04/01/2019  Falls in the past year? 0 0 0 0 0  Number falls in past yr: 0 0 0 0 0  Injury with Fall? 0 0 0 0 0  Follow up - Falls evaluation completed - Falls evaluation completed Falls evaluation completed     Assessment & Plan  1. Frontal sinusitis, unspecified chronicity  - azelastine (ASTELIN) 0.1 % nasal spray; Place 1 spray into both nostrils 2 (two) times daily. Use in each nostril as directed  Dispense: 30 mL; Refill: 0 - doxycycline (VIBRA-TABS) 100 MG tablet; Take 1 tablet (100 mg total) by mouth 2 (two) times daily.  Dispense: 20 tablet; Refill: 0  I discussed the assessment and treatment plan with the patient. The patient was provided an opportunity to ask questions and all were answered. The patient agreed with the plan and demonstrated an understanding of the instructions.   The patient was advised to call back or seek an in-person evaluation if the symptoms worsen or if the condition fails to improve as anticipated.  I provided 15 minutes of non-face-to-face  time during this encounter.  Royal Hawthorn, CMA

## 2020-12-01 ENCOUNTER — Other Ambulatory Visit: Payer: Self-pay | Admitting: Family Medicine

## 2020-12-01 DIAGNOSIS — G8929 Other chronic pain: Secondary | ICD-10-CM

## 2020-12-01 DIAGNOSIS — M25561 Pain in right knee: Secondary | ICD-10-CM

## 2020-12-02 ENCOUNTER — Other Ambulatory Visit: Payer: Self-pay

## 2020-12-09 ENCOUNTER — Telehealth: Payer: 59 | Admitting: Family Medicine

## 2020-12-28 ENCOUNTER — Other Ambulatory Visit: Payer: Self-pay | Admitting: Family Medicine

## 2020-12-28 DIAGNOSIS — J302 Other seasonal allergic rhinitis: Secondary | ICD-10-CM

## 2020-12-28 DIAGNOSIS — J3089 Other allergic rhinitis: Secondary | ICD-10-CM

## 2020-12-28 NOTE — Telephone Encounter (Signed)
Pt aware of medication being sent to pharmacy and pt did scheduled appt for April

## 2021-01-26 ENCOUNTER — Other Ambulatory Visit: Payer: Self-pay | Admitting: Family Medicine

## 2021-01-26 DIAGNOSIS — J302 Other seasonal allergic rhinitis: Secondary | ICD-10-CM

## 2021-01-26 DIAGNOSIS — J3089 Other allergic rhinitis: Secondary | ICD-10-CM

## 2021-01-26 NOTE — Telephone Encounter (Signed)
   Notes to clinic Already had a curtesy refill, has appt 4/6.

## 2021-01-26 NOTE — Progress Notes (Signed)
Name: Ana Knapp   MRN: [id number]    DOB: 1981/05/07   Date:01/27/2021       Progress Note  Subjective  Chief Complaint  Follow up/Medication Refill  HPI  Perennial AR : she states symptoms are worse this time of the year, she has been taking Xyzal  and singulair daily and Flonase this time of the year helps control the symptoms. Nasal congestion, itchy and watery eyes, sneezing, fatigue. She has occasional cough ( history of pulmonary embolism) and post nasal drainage.  She would like to go back to see allergist.   Right knee pain: going on for months, seen by Ortho and diagnosed with strain of right knee, she did some home PT but still has intermittent medial knee pain, but has not been back for follow up. She is still applying ice and seems to help   GERD: she is under the care of Dr. Allen Norris, taking Nexium now, she states Dexilant was better but very expensive. No indigestion or heartburn   Chronic constipation: she was given Trulance but stopped taking it because it caused diarrhea. She states bowel movements every 2- 3 days , no blood in stools, she has to strain at times. She is only taking miralax about once a week.   Family history of thyroid disease: mother has hypothyroidism and she would like to be checked. She has also gained weight since last visit   Chronic DVT left leg: diagnosed at age 40, she was on ocp at the time, she was also diagnosed with May Thurner syndrome, had a stent placed at ilio femoral vein back 2017 after lysis of thrombus she was going for regular follows at [facility] but last visit was in 2021 they stretched to every 3 years. She takes aspirin 81 mg daily, she still has lower leg swelling but stable.   FMS: seen by rheumatologist in [place] when she was 40 yo and diagnosed with FMS. She states she is aching all the time, but would like sometimes for muscle pain at the end of the day   Patient Active Problem List   Diagnosis Date Noted  . Endometriosis 03/01/2018   . Abnormal CT scan   . History of melanoma 11/22/2017  . Biliary dyskinesia 12/05/2016  . Constipation 08/18/2016  . May-Thurner syndrome 11/30/2015  . DVT (deep venous thrombosis) ([place]) 11/13/2015  . History of pulmonary embolism 11/13/2015  . GERD (gastroesophageal reflux disease) 09/28/2015  . Flushing 09/24/2015  . Fibromyalgia 08/30/2015  . Seasonal allergic rhinitis 08/30/2015  . Alopecia 08/30/2015  . Fibrositis 08/30/2015    Past Surgical History:  Procedure Laterality Date  . ANGIOPLASTY / STENTING FEMORAL  March 2017  . CHOLECYSTECTOMY    . COLONOSCOPY WITH PROPOFOL N/A 01/02/2018   Procedure: COLONOSCOPY WITH PROPOFOL;  Surgeon: Lucilla Lame, MD;  [facility] ENDOSCOPY;  Service: Endoscopy;  Laterality: N/A;  . ESOPHAGOGASTRODUODENOSCOPY (EGD) WITH PROPOFOL N/A 01/02/2018   Procedure: ESOPHAGOGASTRODUODENOSCOPY (EGD) WITH PROPOFOL;  Surgeon: Lucilla Lame, MD;  Location: [facility] ENDOSCOPY;  Service: Endoscopy;  Laterality: N/A;  . LAPAROSCOPY N/A 02/26/2018   Procedure: LAPAROSCOPY DIAGNOSTIC WITH BIOPSIES;  Surgeon: Rubie Maid, MD;  Location: [facility] ORS;  Service: Gynecology;  Laterality: N/A;  . Lytics Catheter Placement  11/16/15  . MELANOMA EXCISION  2018   Back  . ROBOTIC ASSISTED LAPAROSCOPIC CHOLECYSTECTOMY-SINGLE SITE  12/29/2016    Family History  Problem Relation Age of Onset  . Thyroid disease Mother   . Diabetes Mother   . Cancer Father  bladder  . Hypertension Maternal Grandmother   . Hypothyroidism Maternal Grandmother   . Thyroid disease Paternal Grandmother   . Diabetes Paternal Grandmother   . Stroke Paternal Grandmother   . Cancer Paternal Grandfather        skin  . Heart disease Neg Hx   . COPD Neg Hx     Social History   Tobacco Use  . Smoking status: Never Smoker  . Smokeless tobacco: Never Used  Substance Use Topics  . Alcohol use: Yes    Comment: rare     Current Outpatient Medications:  .  aspirin 81 MG EC tablet, Take  1 tablet by mouth daily., Disp: , Rfl:  .  diclofenac Sodium (VOLTAREN) 1 % GEL, APPLY 4 GRAMS TO THE AFFECTED AREA FOUR TIMES DAILY, Disp: 200 g, Rfl: 0 .  esomeprazole (NEXIUM) 40 MG capsule, Take 1 capsule (40 mg total) by mouth daily before breakfast., Disp: 30 capsule, Rfl: 10 .  levocetirizine (XYZAL) 5 MG tablet, Take 1 tablet (5 mg total) by mouth every evening., Disp: 30 tablet, Rfl: 11 .  montelukast (SINGULAIR) 10 MG tablet, TAKE 1 TABLET(10 MG) BY MOUTH DAILY, Disp: 30 tablet, Rfl: 0 .  ondansetron (ZOFRAN) 4 MG tablet, Take 1 tablet (4 mg total) by mouth every 8 (eight) hours as needed for nausea or vomiting., Disp: 20 tablet, Rfl: 1 .  polyethylene glycol (MIRALAX / GLYCOLAX) packet, Take 17 g by mouth daily. , Disp: , Rfl:  .  Sod Fluoride-Potassium Nitrate (PREVIDENT 5000 SENSITIVE) 1.1-5 % PSTE, Prevident 5000 Enamel Protect 1.1 %-5 % dental paste  BRUSH TEETH WITH PASTE FOR AT LEAST ONE MINUTE 2 TO 3 TIMES DAILY, Disp: , Rfl:   Allergies  Allergen Reactions  . Ortho Tri-Cyclen [Norgestimate-Eth Estradiol] Other (See Comments)    Blood clot and PE  . Prednisone Anaphylaxis  . Augmentin [Amoxicillin-Pot Clavulanate] Other (See Comments)    Upsets her stomach    I personally reviewed active problem list, medication list, allergies, family history, social history, health maintenance with the patient/caregiver today.   ROS  Constitutional: Negative for fever or weight change.  Respiratory: Negative for cough and shortness of breath.   Cardiovascular: Negative for chest pain or palpitations.  Gastrointestinal: Negative for abdominal pain, no bowel changes.  Musculoskeletal: Negative for gait problem or joint swelling.  Skin: Negative for rash.  Neurological: Negative for dizziness or headache.  No other specific complaints in a complete review of systems (except as listed in HPI above).  Objective  Vitals:   01/27/21 1424  BP: 112/68  Pulse: 96  Resp: 16  Temp: 98.7  F (37.1 C)  TempSrc: Oral  SpO2: 99%  Weight: 170 lb (77.1 kg)  Height: 5\' 9"  (1.753 m)    Body mass index is 25.1 kg/m.  Physical Exam  Constitutional: Patient appears well-developed and well-nourished.  No distress.  HEENT: head atraumatic, normocephalic, pupils equal and reactive to light, neck supple Cardiovascular: Normal rate, regular rhythm and normal heart sounds.  No murmur heard. No BLE edema. Pulmonary/Chest: Effort normal and breath sounds normal. No respiratory distress. Abdominal: Soft.  There is no tenderness. Psychiatric: Patient has a normal mood and affect. behavior is normal. Judgment and thought content normal.  Recent Results (from the past 2160 hour(s))  SARS CORONAVIRUS 2 (TAT 6-24 HRS) Nasopharyngeal Nasopharyngeal Swab     Status: Abnormal   Collection Time: 11/10/20  1:48 PM   Specimen: Nasopharyngeal Swab  Result Value Ref Range  SARS Coronavirus 2 POSITIVE (A) NEGATIVE    Comment: (NOTE) SARS-CoV-2 target nucleic acids are DETECTED.  The SARS-CoV-2 RNA is generally detectable in upper and lower respiratory specimens during the acute phase of infection. Positive results are indicative of the presence of SARS-CoV-2 RNA. Clinical correlation with patient history and other diagnostic information is  necessary to determine patient infection status. Positive results do not rule out bacterial infection or co-infection with other viruses.  The expected result is Negative.  Fact Sheet for Patients: SugarRoll.be  Fact Sheet for Healthcare Providers: https://www.woods-mathews.com/  This test is not yet approved or cleared by the [place] FDA and  has been authorized for detection and/or diagnosis of SARS-CoV-2 by FDA under an Emergency Use Authorization (EUA). This EUA will remain  in effect (meaning this test can be used) for the duration of the COVID-19 declaration under Section 564(b)(1) of the Act, 21  U[place] S[place]C. section 360bbb-3(b)(1), unless the authorization is terminated or revoked sooner.   Performed at [facility], [place] [address] [place], [place] [address]       PHQ2/9: Depression screen [facility] 2/9 01/27/2021 11/27/2020 08/17/2020 01/16/2020 07/08/2019  Decreased Interest 0 0 0 0 0  Down, Depressed, Hopeless 0 3 0 0 0  PHQ - 2 Score 0 3 0 0 0  Altered sleeping 0 0 - 0 0  Tired, decreased energy 0 0 - 0 0  Change in appetite 0 0 - 0 0  Feeling bad or failure about yourself  0 0 - 0 0  Trouble concentrating 0 0 - 0 0  Moving slowly or fidgety/restless 0 0 - 0 0  Suicidal thoughts 0 0 - 0 0  PHQ-9 Score 0 3 - 0 0  Difficult doing work/chores - Not difficult at all - Not difficult at all Not difficult at all    phq 9 is negative   Fall Risk: Fall Risk  01/27/2021 11/27/2020 08/17/2020 01/16/2020 07/08/2019  Falls in the past year? 0 0 0 0 0  Number falls in past yr: 0 0 0 0 0  Injury with Fall? 0 0 0 0 0  Follow up - - Falls evaluation completed - Falls evaluation completed     Functional Status Survey: Is the patient deaf or have difficulty hearing?: No Does the patient have difficulty seeing, even when wearing glasses/contacts?: Yes Does the patient have difficulty concentrating, remembering, or making decisions?: No Does the patient have difficulty walking or climbing stairs?: No Does the patient have difficulty dressing or bathing?: No Does the patient have difficulty doing errands alone such as visiting a doctor's office or shopping?: No    Assessment & Plan  1. Perennial allergic rhinitis with seasonal variation  - montelukast (SINGULAIR) 10 MG tablet; Take 1 tablet (10 mg total) by mouth at bedtime.  Dispense: 90 tablet; Refill: 1 - Ambulatory referral to Immunology  2. Chronic constipation  Doing better  3. GERD without esophagitis   4. Chronic deep vein thrombosis (DVT) of femoral vein of left lower extremity (HCC)  - COMPLETE METABOLIC PANEL WITH  GFR - CBC with Differential/Platelet  5. May-Thurner syndrome   6. Diabetes mellitus screening  - Hemoglobin A1c  7. Lipid screening  - Lipid panel  8. Family history of thyroid disease  - TSH  9. Screening for deficiency anemia  - CBC with Differential/Platelet   10. Fibromyalgia  - baclofen (LIORESAL) 10 MG tablet; Take 1 tablet (10 mg total) by mouth at bedtime as  needed for muscle spasms.  Dispense: 90 each; Refill: 1  11. Encounter for screening mammogram for malignant neoplasm of breast  Mammogram

## 2021-01-27 ENCOUNTER — Encounter: Payer: Self-pay | Admitting: Family Medicine

## 2021-01-27 ENCOUNTER — Other Ambulatory Visit: Payer: Self-pay

## 2021-01-27 ENCOUNTER — Ambulatory Visit (INDEPENDENT_AMBULATORY_CARE_PROVIDER_SITE_OTHER): Payer: 59 | Admitting: Family Medicine

## 2021-01-27 VITALS — BP 112/68 | HR 96 | Temp 98.7°F | Resp 16 | Ht 69.0 in | Wt 170.0 lb

## 2021-01-27 DIAGNOSIS — Z13220 Encounter for screening for lipoid disorders: Secondary | ICD-10-CM

## 2021-01-27 DIAGNOSIS — J302 Other seasonal allergic rhinitis: Secondary | ICD-10-CM

## 2021-01-27 DIAGNOSIS — I82512 Chronic embolism and thrombosis of left femoral vein: Secondary | ICD-10-CM | POA: Diagnosis not present

## 2021-01-27 DIAGNOSIS — Z130 Encounter for screening for diseases of the blood and blood-forming organs and certain disorders involving the immune mechanism: Secondary | ICD-10-CM

## 2021-01-27 DIAGNOSIS — Z131 Encounter for screening for diabetes mellitus: Secondary | ICD-10-CM

## 2021-01-27 DIAGNOSIS — K219 Gastro-esophageal reflux disease without esophagitis: Secondary | ICD-10-CM

## 2021-01-27 DIAGNOSIS — I871 Compression of vein: Secondary | ICD-10-CM

## 2021-01-27 DIAGNOSIS — M797 Fibromyalgia: Secondary | ICD-10-CM

## 2021-01-27 DIAGNOSIS — Z8349 Family history of other endocrine, nutritional and metabolic diseases: Secondary | ICD-10-CM

## 2021-01-27 DIAGNOSIS — J3089 Other allergic rhinitis: Secondary | ICD-10-CM | POA: Diagnosis not present

## 2021-01-27 DIAGNOSIS — M25561 Pain in right knee: Secondary | ICD-10-CM

## 2021-01-27 DIAGNOSIS — Z1231 Encounter for screening mammogram for malignant neoplasm of breast: Secondary | ICD-10-CM

## 2021-01-27 DIAGNOSIS — K5909 Other constipation: Secondary | ICD-10-CM | POA: Diagnosis not present

## 2021-01-27 DIAGNOSIS — G8929 Other chronic pain: Secondary | ICD-10-CM

## 2021-01-27 MED ORDER — BACLOFEN 10 MG PO TABS: 10.0000 mg | ORAL_TABLET | Freq: Every evening | ORAL | 1 refills | Status: DC | PRN

## 2021-01-27 MED ORDER — MONTELUKAST SODIUM 10 MG PO TABS: 10.0000 mg | ORAL_TABLET | Freq: Every day | ORAL | 1 refills | Status: DC

## 2021-01-27 MED ORDER — DICLOFENAC SODIUM 1 % EX GEL: 4.0000 g | Freq: Four times a day (QID) | CUTANEOUS | 1 refills | Status: DC

## 2021-01-28 LAB — LIPID PANEL
Cholesterol: 157 mg/dL (ref <200)
HDL: 56 mg/dL (ref >=50)
LDL Cholesterol (Calc): 86 mg/dL (calc)
Non-HDL Cholesterol (Calc): 101 mg/dL (calc) (ref <130)
Total CHOL/HDL Ratio: 2.8 (calc) (ref <5.0)
Triglycerides: 61 mg/dL (ref <150)

## 2021-01-28 LAB — CBC WITH DIFFERENTIAL/PLATELET
Absolute Monocytes: 490 cells/uL (ref 200–950)
Basophils Absolute: 71 cells/uL (ref 0–200)
Basophils Relative: 1.2 %
Eosinophils Absolute: 561 cells/uL — ABNORMAL HIGH (ref 15–500)
Eosinophils Relative: 9.5 %
HCT: 38.9 % (ref 35.0–45.0)
Hemoglobin: 12.8 g/dL (ref 11.7–15.5)
Lymphs Abs: 2484 cells/uL (ref 850–3900)
MCH: 29 pg (ref 27.0–33.0)
MCHC: 32.9 g/dL (ref 32.0–36.0)
MCV: 88.2 fL (ref 80.0–100.0)
MPV: 9.7 fL (ref 7.5–12.5)
Monocytes Relative: 8.3 %
Neutro Abs: 2295 cells/uL (ref 1500–7800)
Neutrophils Relative %: 38.9 %
Platelets: 240 10*3/uL (ref 140–400)
RBC: 4.41 10*6/uL (ref 3.80–5.10)
RDW: 11.8 % (ref 11.0–15.0)
Total Lymphocyte: 42.1 %
WBC: 5.9 10*3/uL (ref 3.8–10.8)

## 2021-01-28 LAB — HEMOGLOBIN A1C
Hgb A1c MFr Bld: 5.2 % of total Hgb (ref <5.7)
Mean Plasma Glucose: 103 mg/dL
eAG (mmol/L): 5.7 mmol/L

## 2021-01-28 LAB — COMPLETE METABOLIC PANEL WITH GFR
AG Ratio: 1.6 (calc) (ref 1.0–2.5)
ALT: 12 U/L (ref 6–29)
AST: 16 U/L (ref 10–30)
Albumin: 4.3 g/dL (ref 3.6–5.1)
Alkaline phosphatase (APISO): 31 U/L (ref 31–125)
BUN: 18 mg/dL (ref 7–25)
CO2: 27 mmol/L (ref 20–32)
Calcium: 9.2 mg/dL (ref 8.6–10.2)
Chloride: 106 mmol/L (ref 98–110)
Creat: 0.84 mg/dL (ref 0.50–1.10)
GFR, Est African American: 99 mL/min/{1.73_m2} (ref >=60)
GFR, Est Non African American: 86 mL/min/{1.73_m2} (ref >=60)
Globulin: 2.7 g/dL (calc) (ref 1.9–3.7)
Glucose, Bld: 88 mg/dL (ref 65–99)
Potassium: 4.3 mmol/L (ref 3.5–5.3)
Sodium: 140 mmol/L (ref 135–146)
Total Bilirubin: 0.3 mg/dL (ref 0.2–1.2)
Total Protein: 7 g/dL (ref 6.1–8.1)

## 2021-01-28 LAB — TSH: TSH: 1.02 mIU/L

## 2021-02-26 ENCOUNTER — Telehealth (INDEPENDENT_AMBULATORY_CARE_PROVIDER_SITE_OTHER): Payer: 59 | Admitting: Family Medicine

## 2021-02-26 ENCOUNTER — Encounter: Payer: Self-pay | Admitting: Family Medicine

## 2021-02-26 VITALS — Ht 69.0 in | Wt 170.0 lb

## 2021-02-26 DIAGNOSIS — J302 Other seasonal allergic rhinitis: Secondary | ICD-10-CM | POA: Diagnosis not present

## 2021-02-26 DIAGNOSIS — J3089 Other allergic rhinitis: Secondary | ICD-10-CM | POA: Diagnosis not present

## 2021-02-26 DIAGNOSIS — J3489 Other specified disorders of nose and nasal sinuses: Secondary | ICD-10-CM

## 2021-02-26 MED ORDER — FLUTICASONE PROPIONATE 50 MCG/ACT NA SUSP: 2.0000 | Freq: Every day | NASAL | 2 refills | Status: DC

## 2021-02-26 MED ORDER — AZELASTINE HCL 0.1 % NA SOLN: 1.0000 | Freq: Two times a day (BID) | NASAL | 2 refills | Status: DC

## 2021-02-26 NOTE — Progress Notes (Signed)
Name: Ana Knapp   MRN: [id number]    DOB: 05-Jul-1981   Date:02/26/2021       Progress Note  Subjective  Chief Complaint  Acute visit for sinusitis symptoms  I [name]  on 02/26/21 at  2:40 PM EDT by telephone and verified that I am speaking with the correct person using two identifiers.  I discussed the limitations of evaluation and management by telemedicine and the availability of in person appointments. The patient expressed understanding and agreed to proceed with the virtual visit  Staff also discussed with the patient that there may be a patient responsible charge related to this service. Patient Location: at work  Provider Location: [facility] Additional Individuals present: students   HPI  Perennial AR: she states she always has some sneezing and rhinorrhea, post-nasal drainage. She was treated for frontal sinusitis 11/2020 , states she has been feeling more tired than usual . She has some tenderness on frontal area that is sensitive to touch, it has pressure. She had stopped using Astelin but she resumed using it recently, she also has stopped using Flonase ( she states she does not like that it goes down her throat) , she is taking claritin D ia few days ago, nstead of xyzal and singulair . Claridin D has helped more than xyzal She is concerned about bacterial infection because this morning when she blew her nose she noticed green drainage    Patient Active Problem List   Diagnosis Date Noted  . Endometriosis 03/01/2018  . Abnormal CT scan   . History of melanoma 11/22/2017  . Biliary dyskinesia 12/05/2016  . Constipation 08/18/2016  . May-Thurner syndrome 11/30/2015  . DVT (deep venous thrombosis) ([place]) 11/13/2015  . History of pulmonary embolism 11/13/2015  . GERD (gastroesophageal reflux disease) 09/28/2015  . Flushing 09/24/2015  . Fibromyalgia 08/30/2015  . Seasonal allergic rhinitis 08/30/2015  . Fibrositis 08/30/2015    Past  Surgical History:  Procedure Laterality Date  . ANGIOPLASTY / STENTING FEMORAL  March 2017  . CHOLECYSTECTOMY    . COLONOSCOPY WITH PROPOFOL N/A 01/02/2018   Procedure: COLONOSCOPY WITH PROPOFOL;  Surgeon: Lucilla Lame, MD;  Location: [facility] ENDOSCOPY;  Service: Endoscopy;  Laterality: N/A;  . ESOPHAGOGASTRODUODENOSCOPY (EGD) WITH PROPOFOL N/A 01/02/2018   Procedure: ESOPHAGOGASTRODUODENOSCOPY (EGD) WITH PROPOFOL;  Surgeon: Lucilla Lame, MD;  Location: [facility] ENDOSCOPY;  Service: Endoscopy;  Laterality: N/A;  . LAPAROSCOPY N/A 02/26/2018   Procedure: LAPAROSCOPY DIAGNOSTIC WITH BIOPSIES;  Surgeon: Rubie Maid, MD;  Location: [facility] ORS;  Service: Gynecology;  Laterality: N/A;  . Lytics Catheter Placement  11/16/15  . MELANOMA EXCISION  2018   Back  . ROBOTIC ASSISTED LAPAROSCOPIC CHOLECYSTECTOMY-SINGLE SITE  12/29/2016    Family History  Problem Relation Age of Onset  . Thyroid disease Mother   . Diabetes Mother   . Cancer Father        bladder  . Hypertension Maternal Grandmother   . Hypothyroidism Maternal Grandmother   . Thyroid disease Paternal Grandmother   . Diabetes Paternal Grandmother   . Stroke Paternal Grandmother   . Cancer Paternal Grandfather        skin  . Heart disease Neg Hx   . COPD Neg Hx       Current Outpatient Medications:  .  aspirin 81 MG EC tablet, Take 1 tablet by mouth daily., Disp: , Rfl:  .  azelastine (ASTELIN) 0.1 % nasal spray, Place 1 spray into both nostrils 2 (two) times  daily. Use in each nostril as directed, Disp: 30 mL, Rfl: 2 .  baclofen (LIORESAL) 10 MG tablet, Take 1 tablet (10 mg total) by mouth at bedtime as needed for muscle spasms., Disp: 90 each, Rfl: 1 .  diclofenac Sodium (VOLTAREN) 1 % GEL, Apply 4 g topically 4 (four) times daily., Disp: 200 g, Rfl: 1 .  esomeprazole (NEXIUM) 40 MG capsule, Take 1 capsule (40 mg total) by mouth daily before breakfast., Disp: 30 capsule, Rfl: 10 .  fluticasone (FLONASE) 50 MCG/ACT nasal spray, Place 2  sprays into both nostrils daily., Disp: 16 g, Rfl: 2 .  levocetirizine (XYZAL) 5 MG tablet, Take 1 tablet (5 mg total) by mouth every evening., Disp: 30 tablet, Rfl: 11 .  montelukast (SINGULAIR) 10 MG tablet, Take 1 tablet (10 mg total) by mouth at bedtime., Disp: 90 tablet, Rfl: 1 .  polyethylene glycol (MIRALAX / GLYCOLAX) packet, Take 17 g by mouth daily. , Disp: , Rfl:  .  Sod Fluoride-Potassium Nitrate (PREVIDENT 5000 SENSITIVE) 1.1-5 % PSTE, Prevident 5000 Enamel Protect 1.1 %-5 % dental paste  BRUSH TEETH WITH PASTE FOR AT LEAST ONE MINUTE 2 TO 3 TIMES DAILY, Disp: , Rfl:  .  ondansetron (ZOFRAN) 4 MG tablet, Take 1 tablet (4 mg total) by mouth every 8 (eight) hours as needed for nausea or vomiting. (Patient not taking: Reported on 02/26/2021), Disp: 20 tablet, Rfl: 1  Allergies  Allergen Reactions  . Ortho Tri-Cyclen [Norgestimate-Eth Estradiol] Other (See Comments)    Blood clot and PE  . Prednisone Anaphylaxis  . Augmentin [Amoxicillin-Pot Clavulanate] Other (See Comments)    Upsets her stomach    I personally reviewed active problem list, medication list, allergies, family history with the patient/caregiver today.   ROS  Ten systems reviewed and is negative except as mentioned in HPI   Objective  Virtual encounter, vitals not obtained.  Body mass index is 25.1 kg/m.  Physical Exam  Awake, alert and oriented   PHQ2/9: Depression screen [facility] 2/9 02/26/2021 01/27/2021 11/27/2020 08/17/2020 01/16/2020  Decreased Interest 0 0 0 0 0  Down, Depressed, Hopeless 0 0 3 0 0  PHQ - 2 Score 0 0 3 0 0  Altered sleeping 0 0 0 - 0  Tired, decreased energy 0 0 0 - 0  Change in appetite 0 0 0 - 0  Feeling bad or failure about yourself  0 0 0 - 0  Trouble concentrating 0 0 0 - 0  Moving slowly or fidgety/restless 0 0 0 - 0  Suicidal thoughts 0 0 0 - 0  PHQ-9 Score 0 0 3 - 0  Difficult doing work/chores - - Not difficult at all - Not difficult at all   PHQ-2/9 Result is negative.     Fall Risk: Fall Risk  02/26/2021 01/27/2021 11/27/2020 08/17/2020 01/16/2020  Falls in the past year? 0 0 0 0 0  Number falls in past yr: 0 0 0 0 0  Injury with Fall? 0 0 0 0 0  Follow up Falls prevention discussed - - Falls evaluation completed -     Assessment & Plan  1. Perennial allergic rhinitis with seasonal variation  - azelastine (ASTELIN) 0.1 % nasal spray; Place 1 spray into both nostrils 2 (two) times daily. Use in each nostril as directed  Dispense: 30 mL; Refill: 2 - fluticasone (FLONASE) 50 MCG/ACT nasal spray; Place 2 sprays into both nostrils daily.  Dispense: 16 g; Refill: 2  2. Sinus drainage  Explained likely  not a bacterial infection, discussed hot to use nasal spray properly, my take claritin in am and xyzal plus singulair at night. Also gave her number so she can follow up about her appointment with Dr. Donneta Romberg . She will contact me next week if no improvement for antibiotic prescription   I discussed the assessment and treatment plan with the patient. The patient was provided an opportunity to ask questions and all were answered. The patient agreed with the plan and demonstrated an understanding of the instructions.  The patient was advised to call back or seek an in-person evaluation if the symptoms worsen or if the condition fails to improve as anticipated.  I provided 15  minutes of non-face-to-face time during this encounter.

## 2021-03-20 ENCOUNTER — Encounter: Payer: Self-pay | Admitting: Family Medicine

## 2021-03-23 ENCOUNTER — Other Ambulatory Visit: Payer: Self-pay | Admitting: Family Medicine

## 2021-03-23 MED ORDER — AZITHROMYCIN 500 MG PO TABS
500.0000 mg | ORAL_TABLET | Freq: Every day | ORAL | 0 refills | Status: DC
Start: 2021-03-23 — End: 2021-04-13

## 2021-04-05 ENCOUNTER — Other Ambulatory Visit: Payer: Self-pay

## 2021-04-05 ENCOUNTER — Ambulatory Visit (LOCAL_COMMUNITY_HEALTH_CENTER): Payer: Self-pay

## 2021-04-05 DIAGNOSIS — Z111 Encounter for screening for respiratory tuberculosis: Secondary | ICD-10-CM

## 2021-04-08 ENCOUNTER — Ambulatory Visit (LOCAL_COMMUNITY_HEALTH_CENTER): Payer: 59

## 2021-04-08 ENCOUNTER — Other Ambulatory Visit: Payer: Self-pay

## 2021-04-08 DIAGNOSIS — Z111 Encounter for screening for respiratory tuberculosis: Secondary | ICD-10-CM

## 2021-04-08 LAB — TB SKIN TEST
Induration: 0 mm
TB Skin Test: NEGATIVE

## 2021-04-13 ENCOUNTER — Ambulatory Visit: Payer: 59 | Admitting: Unknown Physician Specialty

## 2021-04-13 ENCOUNTER — Encounter: Payer: Self-pay | Admitting: Certified Nurse Midwife

## 2021-04-13 ENCOUNTER — Other Ambulatory Visit: Payer: Self-pay

## 2021-04-13 ENCOUNTER — Ambulatory Visit (INDEPENDENT_AMBULATORY_CARE_PROVIDER_SITE_OTHER): Payer: 59 | Admitting: Certified Nurse Midwife

## 2021-04-13 VITALS — BP 117/69 | HR 87 | Ht 69.0 in | Wt 164.8 lb

## 2021-04-13 DIAGNOSIS — Z01419 Encounter for gynecological examination (general) (routine) without abnormal findings: Secondary | ICD-10-CM

## 2021-04-13 DIAGNOSIS — R102 Pelvic and perineal pain: Secondary | ICD-10-CM | POA: Diagnosis not present

## 2021-04-13 NOTE — Progress Notes (Signed)
Pt presents for routine annual visit, with pap. Is having some abdominal discomfort.

## 2021-04-13 NOTE — Progress Notes (Signed)
GYNECOLOGY ANNUAL PREVENTATIVE CARE ENCOUNTER NOTE  History:     Ana Knapp is a 40 y.o. G0P0000 female here for a routine annual gynecologic exam.  Current complaints: pelvic pain that has increased over the past several months, state she has history of endometriosis but is unable to take any hormonal type medications due to her hight risks for DVT.   Denies abnormal vaginal bleeding, discharge, pelvic pain, problems with intercourse or other gynecologic concerns.     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  Exercise: 3 times wk Smoke/Alcohol/drug use: denies   Gynecologic History Patient's last menstrual period was 03/26/2021. Contraception: none Last Pap: 01/18/2018. Results were: normal with negative HPV Last mammogram: ordered .    The pregnancy intention screening data noted above was reviewed. Potential methods of contraception were discussed. The patient elected to proceed with No Method - Other Reason.      Obstetric History OB History  Gravida Para Term Preterm AB Living  0 0 0 0 0 0  SAB IAB Ectopic Multiple Live Births  0 0 0 0 0    Past Medical History:  Diagnosis Date   Chronic sinusitis    Fibromyalgia    GERD (gastroesophageal reflux disease)    Headache    sinus   History of melanoma 11/22/2017   History of melanoma 2018   Back   May-Thurner syndrome 11/30/2015   January 2017; 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[facility]    PONV (postoperative nausea and vomiting)    Pulmonary embolism [facility])     Past Surgical History:  Procedure Laterality Date   ANGIOPLASTY / STENTING FEMORAL  March 2017   CHOLECYSTECTOMY     COLONOSCOPY WITH PROPOFOL N/A 01/02/2018   Procedure: COLONOSCOPY WITH PROPOFOL;  Surgeon: Lucilla Lame, MD;  Location: [facility] ENDOSCOPY;  Service: Endoscopy;  Laterality: N/A;   ESOPHAGOGASTRODUODENOSCOPY (EGD) WITH PROPOFOL N/A 01/02/2018   Procedure: ESOPHAGOGASTRODUODENOSCOPY (EGD)  WITH PROPOFOL;  Surgeon: Lucilla Lame, MD;  Location[facility] END[facility]COPY;  Service: Endoscopy;  Laterality: N/A;   LAPAROSCOPY N/A 02/26/2018   Procedure: LAPAROSCOPY DIAGNOSTIC WITH BIOPSIES;  Surgeon: Rubie Maid, MD;  Location: [facility] ORS;  Service: Gynecology;  Laterality: N/A;   Lytics Catheter Placement  11/16/15   MELANOMA EXCISION  2018   Back   ROBOTIC ASSISTED LAPAROSCOPIC CHOLECYSTECTOMY-SINGLE SITE  12/29/2016    Current Outpatient Medications on File Prior to Visit  Medication Sig Dispense Refill   acetaminophen (TYLENOL) 325 MG tablet Take 650 mg by mouth every 6 (six) hours as needed.     aspirin 81 MG EC tablet Take 1 tablet by mouth daily.     diclofenac Sodium (VOLTAREN) 1 % GEL Apply 4 g topically 4 (four) times daily. 200 g 1   esomeprazole (NEXIUM) 40 MG capsule Take 1 capsule (40 mg total) by mouth daily before breakfast. 30 capsule 10   fluticasone (FLONASE) 50 MCG/ACT nasal spray Place 2 sprays into both nostrils daily. 16 g 2   ibuprofen (ADVIL) 200 MG tablet Take 200 mg by mouth every 6 (six) hours as needed.     levocetirizine (XYZAL) 5 MG tablet Take 1 tablet (5 mg total) by mouth every evening. 30 tablet 11   montelukast (SINGULAIR) 10 MG tablet Take 1 tablet (10 mg total) by mouth at bedtime. 90 tablet 1   Multiple Vitamin (MULTIVITAMIN) tablet Take 1 tablet by mouth daily.     polyethylene glycol (MIRALAX / GLYCOLAX) packet  Take 17 g by mouth daily.      Sod Fluoride-Potassium Nitrate (PREVIDENT 5000 SENSITIVE) 1.1-5 % PSTE Prevident 5000 Enamel Protect 1.1 %-5 % dental paste  BRUSH TEETH WITH PASTE FOR AT LEAST ONE MINUTE 2 TO 3 TIMES DAILY     azelastine (ASTELIN) 0.1 % nasal spray Place 1 spray into both nostrils 2 (two) times daily. Use in each nostril as directed 30 mL 2   azithromycin (ZITHROMAX) 500 MG tablet Take 1 tablet (500 mg total) by mouth daily. 3 tablet 0   baclofen (LIORESAL) 10 MG tablet Take 1 tablet (10 mg total) by mouth at bedtime as needed for  muscle spasms. (Patient not taking: No sig reported) 90 each 1   ondansetron (ZOFRAN) 4 MG tablet Take 1 tablet (4 mg total) by mouth every 8 (eight) hours as needed for nausea or vomiting. (Patient not taking: No sig reported) 20 tablet 1   No current facility-administered medications on file prior to visit.    Allergies  Allergen Reactions   Ortho Tri-Cyclen [Norgestimate-Eth Estradiol] Other (See Comments)    Blood clot and PE   Prednisone Anaphylaxis   Augmentin [Amoxicillin-Pot Clavulanate] Other (See Comments)    Upsets her stomach    Social History:  reports that she has never smoked. She has never used smokeless tobacco. She reports current alcohol use. She reports that she does not use drugs.  Family History  Problem Relation Age of Onset   Thyroid disease Mother    Diabetes Mother    Cancer Father        bladder   Hypertension Maternal Grandmother    Hypothyroidism Maternal Grandmother    Thyroid disease Paternal Grandmother    Diabetes Paternal Grandmother    Stroke Paternal Grandmother    Cancer Paternal Grandfather        skin   Heart disease Neg Hx    COPD Neg Hx     The following portions of the patient's history were reviewed and updated as appropriate: allergies, current medications, past family history, past medical history, past social history, past surgical history and problem list.  Review of Systems Pertinent items noted in HPI and remainder of comprehensive ROS otherwise negative.  Physical Exam:  BP 117/69   Pulse 87   Ht 5\' 9"  (1.753 m)   Wt 164 lb 12.8 oz (74.8 kg)   LMP 03/26/2021   BMI 24.34 kg/m  CONSTITUTIONAL: Well-developed, well-nourished female in no acute distress.  HENT:  Normocephalic, atraumatic, External right and left ear normal. Oropharynx is clear and moist EYES: Conjunctivae and EOM are normal. Pupils are equal, round, and reactive to light. No scleral icterus.  NECK: Normal range of motion, supple, no masses.  Normal  thyroid.  SKIN: Skin is warm and dry. No rash noted. Not diaphoretic. No erythema. No pallor. MUSCULOSKELETAL: Normal range of motion. No tenderness.  No cyanosis, clubbing, or edema.  2+ distal pulses. NEUROLOGIC: Alert and oriented to person, place, and time. Normal reflexes, muscle tone coordination.  PSYCHIATRIC: Normal mood and affect. Normal behavior. Normal judgment and thought content. CARDIOVASCULAR: Normal heart rate noted, regular rhythm RESPIRATORY: Clear to auscultation bilaterally. Effort and breath sounds normal, no problems with respiration noted. BREASTS: Symmetric in size. Right- masses, tenderness, skin changes, nipple drainage, or lymphadenopathy bilaterally.  Left breast upper outer quadrant 2 o clock , dense tender tissue noted approx. Pea size.  ABDOMEN: Soft, no distention noted.  No tenderness, rebound or guarding.  PELVIC: Normal appearing external  genitalia and urethral meatus; normal appearing vaginal mucosa and cervix.  No abnormal discharge noted.  Pap smear not due. uterine size- slight enlarged , tender on exam , no other palpable masses, no uterine or adnexal tenderness.  .   Assessment and Plan:  Annual Well Women GYN exam   Pap: not due Mammogram : ordered -pt encouraged to have done discussed finding of dense tissue upper outer quadrant of left breast likely fibrocystic but encourage pt to have mammogram to make sure.  Labs: none due Refills/orders: pelvic u/s due to enlarge uterus, tender with palpation Referral: none Discussed use of progestin only method to help control endometrial pain, pt declines . States she is high risk for recurrence of DVT.  Routine preventative health maintenance measures emphasized. Please refer to After Visit Summary for other counseling recommendations.      Philip Aspen, CNM [facility]

## 2021-04-13 NOTE — Patient Instructions (Signed)
Preventive Care 42-42 Years Old, Female Preventive care refers to lifestyle choices and visits with your health care provider that can promote health and wellness. This includes: A yearly physical exam. This is also called an annual wellness visit. Regular dental and eye exams. Immunizations. Screening for certain conditions. Healthy lifestyle choices, such as: Eating a healthy diet. Getting regular exercise. Not using drugs or products that contain nicotine and tobacco. Limiting alcohol use. What can I expect for my preventive care visit? Physical exam Your health care provider will check your: Height and weight. These may be used to calculate your BMI (body mass index). BMI is a measurement that tells if you are at a healthy weight. Heart rate and blood pressure. Body temperature. Skin for abnormal spots. Counseling Your health care provider may ask you questions about your: Past medical problems. Family's medical history. Alcohol, tobacco, and drug use. Emotional well-being. Home life and relationship well-being. Sexual activity. Diet, exercise, and sleep habits. Work and work Statistician. Access to firearms. Method of birth control. Menstrual cycle. Pregnancy history. What immunizations do I need?  Vaccines are usually given at various ages, according to a schedule. Your health care provider will recommend vaccines for you based on your age, medicalhistory, and lifestyle or other factors, such as travel or where you work. What tests do I need? Blood tests Lipid and cholesterol levels. These may be checked every 5 years, or more often if you are over 42 years old. Hepatitis C test. Hepatitis B test. Screening Lung cancer screening. You may have this screening every year starting at age 30 if you have a 30-pack-year history of smoking and currently smoke or have quit within the past 15 years. Colorectal cancer screening. All adults should have this screening starting at  age 23 and continuing until age 42. Your health care provider may recommend screening at age 42 if you are at increased risk. You will have tests every 1-10 years, depending on your results and the type of screening test. Diabetes screening. This is done by checking your blood sugar (glucose) after you have not eaten for a while (fasting). You may have this done every 1-3 years. Mammogram. This may be done every 1-2 years. Talk with your health care provider about when you should start having regular mammograms. This may depend on whether you have a family history of breast cancer. BRCA-related cancer screening. This may be done if you have a family history of breast, ovarian, tubal, or peritoneal cancers. Pelvic exam and Pap test. This may be done every 3 years starting at age 42. Starting at age 54, this may be done every 5 years if you have a Pap test in combination with an HPV test. Other tests STD (sexually transmitted disease) testing, if you are at risk. Bone density scan. This is done to screen for osteoporosis. You may have this scan if you are at high risk for osteoporosis. Talk with your health care provider about your test results, treatment options,and if necessary, the need for more tests. Follow these instructions at home: Eating and drinking  Eat a diet that includes fresh fruits and vegetables, whole grains, lean protein, and low-fat dairy products. Take vitamin and mineral supplements as recommended by your health care provider. Do not drink alcohol if: Your health care provider tells you not to drink. You are pregnant, may be pregnant, or are planning to become pregnant. If you drink alcohol: Limit how much you have to 0-1 drink a day. Be aware  of how much alcohol is in your drink. In the [place] one drink equals one 12 oz bottle of beer (355 mL), one 5 oz glass of wine (148 mL), or one 1 oz glass of hard liquor (44 mL).  Lifestyle Take daily care of your teeth and  gums. Brush your teeth every morning and night with fluoride toothpaste. Floss one time each day. Stay active. Exercise for at least 30 minutes 5 or more days each week. Do not use any products that contain nicotine or tobacco, such as cigarettes, e-cigarettes, and chewing tobacco. If you need help quitting, ask your health care provider. Do not use drugs. If you are sexually active, practice safe sex. Use a condom or other form of protection to prevent STIs (sexually transmitted infections). If you do not wish to become pregnant, use a form of birth control. If you plan to become pregnant, see your health care provider for a prepregnancy visit. If told by your health care provider, take low-dose aspirin daily starting at age 29. Find healthy ways to cope with stress, such as: Meditation, yoga, or listening to music. Journaling. Talking to a trusted person. Spending time with friends and family. Safety Always wear your seat belt while driving or riding in a vehicle. Do not drive: If you have been drinking alcohol. Do not ride with someone who has been drinking. When you are tired or distracted. While texting. Wear a helmet and other protective equipment during sports activities. If you have firearms in your house, make sure you follow all gun safety procedures. What's next? Visit your health care provider once a year for an annual wellness visit. Ask your health care provider how often you should have your eyes and teeth checked. Stay up to date on all vaccines. This information is not intended to replace advice given to you by your health care provider. Make sure you discuss any questions you have with your healthcare provider. Document Revised: 07/14/2020 Document Reviewed: 06/21/2018 Elsevier Patient Education  2022 [employer].

## 2021-04-18 ENCOUNTER — Encounter: Payer: Self-pay | Admitting: Emergency Medicine

## 2021-04-18 ENCOUNTER — Telehealth: Payer: 59 | Admitting: Emergency Medicine

## 2021-04-18 DIAGNOSIS — J069 Acute upper respiratory infection, unspecified: Secondary | ICD-10-CM | POA: Diagnosis not present

## 2021-04-18 MED ORDER — BENZONATATE 100 MG PO CAPS: 100.0000 mg | ORAL_CAPSULE | Freq: Three times a day (TID) | ORAL | 0 refills | Status: DC | PRN

## 2021-04-18 NOTE — Patient Instructions (Addendum)
Viral URI with Cough Tessalon 100mg  1-2 capsules every 8 hours as needed for cough. May continue Mucinex Encourage fluids and rest Work note provided for tomorrow Encourage COVID testing Follow up in-person later this week if not improving, especially if symptoms worsening- fever, trouble breathing, unable to keep down fluids, or other new concerning symptoms develop.

## 2021-04-18 NOTE — Progress Notes (Signed)
Ms. akirra, lacerda are scheduled for a virtual visit with your provider today.    Just as we do with appointments in the office, we must obtain your consent to participate.  Your consent will be active for this visit and any virtual visit you may have with one of our providers in the next 365 days.    If you have a MyChart account, I can also send a copy of this consent to you electronically.  All virtual visits are billed to your insurance company just like a traditional visit in the office.  As this is a virtual visit, video technology does not allow for your provider to perform a traditional examination.  This may limit your provider's ability to fully assess your condition.  If your provider identifies any concerns that need to be evaluated in person or the need to arrange testing such as labs, EKG, etc, we will make arrangements to do so.    Although advances in technology are sophisticated, we cannot ensure that it will always work on either your end or our end.  If the connection with a video visit is poor, we may have to switch to a telephone visit.  With either a video or telephone visit, we are not always able to ensure that we have a secure connection.   I need to obtain your verbal consent now.   Are you willing to proceed with your visit today?   Adaliz Dobis has provided verbal consent on 04/18/2021 for a virtual visit (video or telephone).   Noe Gens, PA-C 04/18/2021  9:13 AM   Date:  04/18/2021   ID:  Trisha, Ken Apr 07, 1981, MRN [id number]  Patient Location: Home Provider Location: Home Office   Participants: Patient and Provider for Visit and Wrap up  Method of visit: Video  Location of Patient: Home Location of Provider: Home Office Consent was obtain for visit over the video. Services rendered by provider: Visit was performed via video  A video enabled telemedicine application was used and I verified that I am speaking with the correct person using two  identifiers.  PCP:  Steele Sizer, MD   Chief Complaint:  cough, hoarse voice   History of Present Illness:    Starlet Gallentine is a 40 y.o. female with history as stated below. Presents video telehealth for an acute care visit  Onset of symptoms was yesterday and symptoms have been persistent and include: intermittent nonproductive cough, hoarse voice, mild sore throat. She has tried [employer], with mild relief.    Denies having fevers, chills, shortness of breath, chest pain, ear pain, headache or exposure to covid or other sick contacts.   No other aggravating or relieving factors.  No other c/o.   The patient does have symptoms concerning for COVID-19 infection (fever, chills, cough, or new shortness of breath).  Patient has not been tested for COVID during this illness.  She has not had the COVID vaccine.    Past Medical, Surgical, Social History, Allergies, and Medications have been Reviewed.  Patient Active Problem List   Diagnosis Date Noted   Endometriosis 03/01/2018   Abnormal CT scan    History of melanoma 11/22/2017   Biliary dyskinesia 12/05/2016   Constipation 08/18/2016   May-Thurner syndrome 11/30/2015   DVT (deep venous thrombosis) ([place]) 11/13/2015   History of pulmonary embolism 11/13/2015   GERD (gastroesophageal reflux disease) 09/28/2015   Flushing 09/24/2015   Fibromyalgia 08/30/2015   Seasonal allergic rhinitis 08/30/2015  Fibrositis 08/30/2015    Social History   Tobacco Use   Smoking status: Never   Smokeless tobacco: Never  Substance Use Topics   Alcohol use: Not Currently    Comment: rare     Current Outpatient Medications:    benzonatate (TESSALON) 100 MG capsule, Take 1-2 capsules (100-200 mg total) by mouth 3 (three) times daily as needed., Disp: 20 capsule, Rfl: 0   acetaminophen (TYLENOL) 325 MG tablet, Take 650 mg by mouth every 6 (six) hours as needed., Disp: , Rfl:    aspirin 81 MG EC tablet, Take 1 tablet by mouth  daily., Disp: , Rfl:    diclofenac Sodium (VOLTAREN) 1 % GEL, Apply 4 g topically 4 (four) times daily., Disp: 200 g, Rfl: 1   esomeprazole (NEXIUM) 40 MG capsule, Take 1 capsule (40 mg total) by mouth daily before breakfast., Disp: 30 capsule, Rfl: 10   fluticasone (FLONASE) 50 MCG/ACT nasal spray, Place 2 sprays into both nostrils daily., Disp: 16 g, Rfl: 2   ibuprofen (ADVIL) 200 MG tablet, Take 200 mg by mouth every 6 (six) hours as needed., Disp: , Rfl:    levocetirizine (XYZAL) 5 MG tablet, Take 1 tablet (5 mg total) by mouth every evening., Disp: 30 tablet, Rfl: 11   montelukast (SINGULAIR) 10 MG tablet, Take 1 tablet (10 mg total) by mouth at bedtime., Disp: 90 tablet, Rfl: 1   Multiple Vitamin (MULTIVITAMIN) tablet, Take 1 tablet by mouth daily., Disp: , Rfl:    ondansetron (ZOFRAN) 4 MG tablet, Take 1 tablet (4 mg total) by mouth every 8 (eight) hours as needed for nausea or vomiting., Disp: 20 tablet, Rfl: 1   polyethylene glycol (MIRALAX / GLYCOLAX) packet, Take 17 g by mouth daily. , Disp: , Rfl:    Sod Fluoride-Potassium Nitrate (PREVIDENT 5000 SENSITIVE) 1.1-5 % PSTE, Prevident 5000 Enamel Protect 1.1 %-5 % dental paste  BRUSH TEETH WITH PASTE FOR AT LEAST ONE MINUTE 2 TO 3 TIMES DAILY, Disp: , Rfl:    Allergies  Allergen Reactions   Ortho Tri-Cyclen [Norgestimate-Eth Estradiol] Other (See Comments)    Blood clot and PE   Prednisone Anaphylaxis   Augmentin [Amoxicillin-Pot Clavulanate] Other (See Comments)    Upsets her stomach     ROS See HPI for history of present illness.  Physical Exam Constitutional:      General: She is not in acute distress.    Appearance: Normal appearance. She is not ill-appearing, toxic-appearing or diaphoretic.     Comments: Resting comfortably at home. Alert and cooperative during video visit. No acute distress.   HENT:     Head: Normocephalic.  Eyes:     Extraocular Movements: Extraocular movements intact.  Pulmonary:     Effort:  Pulmonary effort is normal. No respiratory distress.     Breath sounds: No stridor.     Comments: Mildly hoarse voice without stridor. Able to speak in full sentences without difficulty.  Musculoskeletal:     Cervical back: Normal range of motion.  Neurological:     Mental Status: She is alert.  Psychiatric:        Mood and Affect: Mood normal.        Behavior: Behavior normal.              A&P  Viral URI with Cough Tessalon 100mg  1-2 capsules every 8 hours as needed for cough. May continue Mucinex Encouraged fluids and rest Work note provided for tomorrow Encouraged COVID testing  F/u in-person later  this week if not improving, especially if symptoms worsening.    Patient voiced understanding and agreement to plan.   Time:   Today, I have spent 15 minutes with the patient with telehealth technology discussing the above problems, reviewing the chart, previous notes, medications and orders.    Tests Ordered: No orders of the defined types were placed in this encounter.   Medication Changes: Meds ordered this encounter  Medications   benzonatate (TESSALON) 100 MG capsule    Sig: Take 1-2 capsules (100-200 mg total) by mouth 3 (three) times daily as needed.    Dispense:  20 capsule    Refill:  0     Disposition:  Follow up with PCP or urgent care later this week if not improving, sooner if symptoms worsening.   Etta Grandchild, PA-C  04/18/2021 9:13 AM

## 2021-05-04 ENCOUNTER — Ambulatory Visit: Payer: 59 | Admitting: Family Medicine

## 2021-05-06 ENCOUNTER — Ambulatory Visit
Admission: RE | Admit: 2021-05-06 | Discharge: 2021-05-06 | Disposition: A | Discharge status: Home / Self Care | Payer: 59 | Source: Ambulatory Visit | Attending: Certified Nurse Midwife | Admitting: Certified Nurse Midwife

## 2021-05-06 ENCOUNTER — Other Ambulatory Visit: Payer: Self-pay

## 2021-05-06 DIAGNOSIS — Z01419 Encounter for gynecological examination (general) (routine) without abnormal findings: Secondary | ICD-10-CM

## 2021-05-06 DIAGNOSIS — R102 Pelvic and perineal pain: Secondary | ICD-10-CM | POA: Insufficient documentation

## 2021-05-13 ENCOUNTER — Other Ambulatory Visit: Payer: Self-pay | Admitting: Family Medicine

## 2021-05-13 DIAGNOSIS — N809 Endometriosis, unspecified: Secondary | ICD-10-CM

## 2021-05-13 DIAGNOSIS — N945 Secondary dysmenorrhea: Secondary | ICD-10-CM

## 2021-05-14 NOTE — Telephone Encounter (Signed)
Pts next appt is 06/01/21

## 2021-05-14 NOTE — Telephone Encounter (Signed)
Requested medications are due for refill today yes  Requested medications are on the active medication list yes  Last refill 04/17/20  Last visit 4/6  Future visit scheduled 8/9  Notes to clinic Not Delegated.

## 2021-05-20 ENCOUNTER — Telehealth: Payer: 59 | Admitting: Physician Assistant

## 2021-05-20 DIAGNOSIS — Z5321 Procedure and treatment not carried out due to patient leaving prior to being seen by health care provider: Secondary | ICD-10-CM

## 2021-05-20 NOTE — Progress Notes (Signed)
Attempted to connect via video with patient for her appointment after being notified by primary provider assistance was needed. Waited with no connection. Sent direct links for visit x 2 and called cell phone once with no answer. Waited an additional 10 minutes for patient to connect without success. Will close out encounter -- no charge of course as erroneous encounter.   If she pops on before end of the shift, will try to connect and complete visit.

## 2021-05-21 ENCOUNTER — Encounter: Payer: Self-pay | Admitting: Nurse Practitioner

## 2021-05-21 ENCOUNTER — Telehealth: Payer: 59 | Admitting: Nurse Practitioner

## 2021-05-21 DIAGNOSIS — J0101 Acute recurrent maxillary sinusitis: Secondary | ICD-10-CM

## 2021-05-21 MED ORDER — DOXYCYCLINE HYCLATE 100 MG PO TABS: 100.0000 mg | ORAL_TABLET | Freq: Two times a day (BID) | ORAL | 0 refills | Status: DC

## 2021-05-21 NOTE — Progress Notes (Signed)
Virtual Visit Consent   [name], you are scheduled for a virtual visit with [name], [place], a [facility] provider, today.     Just as with appointments in the office, your consent must be obtained to participate.  Your consent will be active for this visit and any virtual visit you may have with one of our providers in the next 365 days.     If you have a MyChart account, a copy of this consent can be sent to you electronically.  All virtual visits are billed to your insurance company just like a traditional visit in the office.    As this is a virtual visit, video technology does not allow for your provider to perform a traditional examination.  This may limit your provider's ability to fully assess your condition.  If your provider identifies any concerns that need to be evaluated in person or the need to arrange testing (such as labs, EKG, etc.), we will make arrangements to do so.     Although advances in technology are sophisticated, we cannot ensure that it will always work on either your end or our end.  If the connection with a video visit is poor, the visit may have to be switched to a telephone visit.  With either a video or telephone visit, we are not always able to ensure that we have a secure connection.     I need to obtain your verbal consent now.   Are you willing to proceed with your visit today? YES   Tammye Roese has provided verbal consent on 05/21/2021 for a virtual visit (video or telephone).   [name], FNP   Date: 05/21/2021 7:49 PM   Virtual Visit via Video Note   I, [name], connected with Ana Knapp ([id number], 12/03/1980) on 05/21/21 at  7:45 PM EDT by a video-enabled telemedicine application and verified that I am speaking with the correct person using two identifiers.  Location: Patient: Virtual Visit Location Patient: Home Provider: Virtual Visit Location Provider: Mobile   I discussed the  limitations of evaluation and management by telemedicine and the availability of in person appointments. The patient expressed understanding and agreed to proceed.    History of Present Illness: Ana Knapp is a 40 y.o. who identifies as a female who was assigned female at birth, and is being seen today for sinusitis .  HPI:   Patient states that she had a video visit back at the end of June. She had cough and congestion. Was dx with virus. She says she has not gotten any better. Sinus congestion has worsened, runny nose and facial  pressure. She has been using mucinex, flonase and dayquil which has not helped at all. She has tested negative for covid.  Review of Systems  Constitutional:  Negative for chills and fever.  HENT:  Positive for congestion, ear pain and sinus pain. Negative for sore throat.   Respiratory:  Positive for cough.   Musculoskeletal:  Positive for myalgias.  Neurological:  Positive for headaches (slight). Negative for dizziness.   Problems:  Patient Active Problem List   Diagnosis Date Noted   Endometriosis 03/01/2018   Abnormal CT scan    History of melanoma 11/22/2017   Biliary dyskinesia 12/05/2016   Constipation 08/18/2016   May-Thurner syndrome 11/30/2015   DVT (deep venous thrombosis) ([place]) 11/13/2015   History of pulmonary embolism 11/13/2015   GERD (gastroesophageal reflux disease) 09/28/2015   Flushing 09/24/2015  Fibromyalgia 08/30/2015   Seasonal allergic rhinitis 08/30/2015   Fibrositis 08/30/2015    Allergies:  Allergies  Allergen Reactions   Ortho Tri-Cyclen [Norgestimate-Eth Estradiol] Other (See Comments)    Blood clot and PE   Prednisone Anaphylaxis   Augmentin [Amoxicillin-Pot Clavulanate] Other (See Comments)    Upsets her stomach   Medications:  Current Outpatient Medications:    acetaminophen (TYLENOL) 325 MG tablet, Take 650 mg by mouth every 6 (six) hours as needed., Disp: , Rfl:    aspirin 81 MG EC tablet, Take 1  tablet by mouth daily., Disp: , Rfl:    benzonatate (TESSALON) 100 MG capsule, Take 1-2 capsules (100-200 mg total) by mouth 3 (three) times daily as needed., Disp: 20 capsule, Rfl: 0   diclofenac Sodium (VOLTAREN) 1 % GEL, Apply 4 g topically 4 (four) times daily., Disp: 200 g, Rfl: 1   esomeprazole (NEXIUM) 40 MG capsule, Take 1 capsule (40 mg total) by mouth daily before breakfast., Disp: 30 capsule, Rfl: 10   fluticasone (FLONASE) 50 MCG/ACT nasal spray, Place 2 sprays into both nostrils daily., Disp: 16 g, Rfl: 2   ibuprofen (ADVIL) 200 MG tablet, Take 200 mg by mouth every 6 (six) hours as needed., Disp: , Rfl:    levocetirizine (XYZAL) 5 MG tablet, Take 1 tablet (5 mg total) by mouth every evening., Disp: 30 tablet, Rfl: 11   montelukast (SINGULAIR) 10 MG tablet, Take 1 tablet (10 mg total) by mouth at bedtime., Disp: 90 tablet, Rfl: 1   Multiple Vitamin (MULTIVITAMIN) tablet, Take 1 tablet by mouth daily., Disp: , Rfl:    ondansetron (ZOFRAN) 4 MG tablet, TAKE 1 TABLET(4 MG) BY MOUTH EVERY 8 HOURS AS NEEDED FOR NAUSEA OR VOMITING, Disp: 20 tablet, Rfl: 0   polyethylene glycol (MIRALAX / GLYCOLAX) packet, Take 17 g by mouth daily. , Disp: , Rfl:    Sod Fluoride-Potassium Nitrate (PREVIDENT 5000 SENSITIVE) 1.1-5 % PSTE, Prevident 5000 Enamel Protect 1.1 %-5 % dental paste  BRUSH TEETH WITH PASTE FOR AT LEAST ONE MINUTE 2 TO 3 TIMES DAILY, Disp: , Rfl:   Observations/Objective: Patient is well-developed, well-nourished in no acute distress.  Resting comfortably at home.  Head is normocephalic, atraumatic.  No labored breathing. Speech is clear and coherent with logical content.  Patient is alert and oriented at baseline.  Voice hoarse No cough oted.  Assessment and Plan:  [name] in today with chief complaint of Sinusitis   1. Acute recurrent maxillary sinusitis 1. Take meds as prescribed 2. Use a cool mist humidifier especially during the winter months and when heat has  been humid. 3. Use saline nose sprays frequently 4. Saline irrigations of the nose can be very helpful if done frequently.  * 4X daily for 1 week*  * Use of a nettie pot can be helpful with this. Follow directions with this* 5. Drink plenty of fluids 6. Keep thermostat turn down low 7.For any cough or congestion  Use plain Mucinex- regular strength or max strength is fine   * Children- consult with Pharmacist for dosing 8. For fever or aces or pains- take tylenol or ibuprofen appropriate for age and weight.  * for fevers greater than 101 orally you may alternate ibuprofen and tylenol every  3 hours.   Meds ordered this encounter  Medications   doxycycline (VIBRA-TABS) 100 MG tablet    Sig: Take 1 tablet (100 mg total) by mouth 2 (two) times daily.    Dispense:  20  tablet    Refill:  0    Order Specific Question:   Supervising Provider    Answer:   Noemi Chapel [[id number]]       Follow Up Instructions: I discussed the assessment and treatment plan with the patient. The patient was provided an opportunity to ask questions and all were answered. The patient agreed with the plan and demonstrated an understanding of the instructions.  A copy of instructions were sent to the patient via MyChart.  The patient was advised to call back or seek an in-person evaluation if the symptoms worsen or if the condition fails to improve as anticipated.  Time:  I spent 8 minutes with the patient via telehealth technology discussing the above problems/concerns.    [name], FNP

## 2021-05-24 ENCOUNTER — Other Ambulatory Visit: Payer: Self-pay | Admitting: Nurse Practitioner

## 2021-05-24 MED ORDER — DOXYCYCLINE HYCLATE 100 MG PO TABS: 100.0000 mg | ORAL_TABLET | Freq: Two times a day (BID) | ORAL | 0 refills | Status: DC

## 2021-05-24 NOTE — Telephone Encounter (Signed)
Doxycycline prescription resubmitted

## 2021-06-01 ENCOUNTER — Ambulatory Visit: Payer: 59 | Admitting: Family Medicine

## 2021-06-22 ENCOUNTER — Ambulatory Visit: Payer: 59 | Admitting: Family Medicine

## 2021-07-02 ENCOUNTER — Telehealth: Payer: 59 | Admitting: Nurse Practitioner

## 2021-07-02 DIAGNOSIS — R059 Cough, unspecified: Secondary | ICD-10-CM

## 2021-07-02 MED ORDER — ALBUTEROL SULFATE HFA 108 (90 BASE) MCG/ACT IN AERS: 2.0000 | INHALATION_SPRAY | Freq: Four times a day (QID) | RESPIRATORY_TRACT | 0 refills | Status: DC | PRN

## 2021-07-02 MED ORDER — AZITHROMYCIN 250 MG PO TABS: ORAL_TABLET | ORAL | 0 refills | Status: DC

## 2021-07-02 MED ORDER — BENZONATATE 100 MG PO CAPS: 100.0000 mg | ORAL_CAPSULE | Freq: Two times a day (BID) | ORAL | 0 refills | Status: DC | PRN

## 2021-07-02 NOTE — Progress Notes (Signed)
Virtual Visit Consent   Ana Knapp, you are scheduled for a virtual visit with Ana Knapp, [place], a [facility] provider, today.     Just as with appointments in the office, your consent must be obtained to participate.  Your consent will be active for this visit and any virtual visit you may have with one of our providers in the next 365 days.     If you have a MyChart account, a copy of this consent can be sent to you electronically.  All virtual visits are billed to your insurance company just like a traditional visit in the office.    As this is a virtual visit, video technology does not allow for your provider to perform a traditional examination.  This may limit your provider's ability to fully assess your condition.  If your provider identifies any concerns that need to be evaluated in person or the need to arrange testing (such as labs, EKG, etc.), we will make arrangements to do so.     Although advances in technology are sophisticated, we cannot ensure that it will always work on either your end or our end.  If the connection with a video visit is poor, the visit may have to be switched to a telephone visit.  With either a video or telephone visit, we are not always able to ensure that we have a secure connection.     I need to obtain your verbal consent now.   Are you willing to proceed with your visit today? YES   Ana Knapp has provided verbal consent on 07/02/2021 for a virtual visit (video or telephone).   Ana Hassell Done, FNP   Date: 07/02/2021 6:49 PM   Virtual Visit via Video Note   I, Ana Knapp, connected with Ana Knapp ([id number], 02-Jan-1981) on 07/02/21 at  6:45 PM EDT by a video-enabled telemedicine application and verified that I am speaking with the correct person using two identifiers.  Location: Patient: Virtual Visit Location Patient: Home Provider: Virtual Visit Location Provider: Mobile   I discussed the  limitations of evaluation and management by telemedicine and the availability of in person appointments. The patient expressed understanding and agreed to proceed.    History of Present Illness: Ana Knapp is a 40 y.o. who identifies as a female who was assigned female at birth, and is being seen today for cough. She has been gargling with salt water along with dayquil. She is on daily dose of singulair, xyzal and flonase.   Review of Systems  Constitutional:  Negative for chills and fever.  HENT:  Positive for congestion. Negative for sinus pain and sore throat.   Respiratory:  Positive for cough and sputum production. Negative for shortness of breath.   Musculoskeletal:  Negative for myalgias.  Neurological:  Negative for dizziness and headaches.    Patient calls stating she has had ac ouygh since June. It is croupy like cough. Has occasional greenish phlegm. She was on doxycycline first of August for sinus infection. They did not help the cough. She has an appointment with her PCP on sept 21,2022. Problems:  Patient Active Problem List   Diagnosis Date Noted   Endometriosis 03/01/2018   Abnormal CT scan    History of melanoma 11/22/2017   Biliary dyskinesia 12/05/2016   Constipation 08/18/2016   May-Thurner syndrome 11/30/2015   DVT (deep venous thrombosis) ([place]) 11/13/2015   History of pulmonary embolism 11/13/2015   GERD (gastroesophageal reflux disease) 09/28/2015  Flushing 09/24/2015   Fibromyalgia 08/30/2015   Seasonal allergic rhinitis 08/30/2015   Fibrositis 08/30/2015    Allergies:  Allergies  Allergen Reactions   Ortho Tri-Cyclen [Norgestimate-Eth Estradiol] Other (See Comments)    Blood clot and PE   Prednisone Anaphylaxis   Augmentin [Amoxicillin-Pot Clavulanate] Other (See Comments)    Upsets her stomach   Medications:  Current Outpatient Medications:    acetaminophen (TYLENOL) 325 MG tablet, Take 650 mg by mouth every 6 (six) hours as needed., Disp:  , Rfl:    aspirin 81 MG EC tablet, Take 1 tablet by mouth daily., Disp: , Rfl:    benzonatate (TESSALON) 100 MG capsule, Take 1-2 capsules (100-200 mg total) by mouth 3 (three) times daily as needed., Disp: 20 capsule, Rfl: 0   diclofenac Sodium (VOLTAREN) 1 % GEL, Apply 4 g topically 4 (four) times daily., Disp: 200 g, Rfl: 1   doxycycline (VIBRA-TABS) 100 MG tablet, Take 1 tablet (100 mg total) by mouth 2 (two) times daily., Disp: 20 tablet, Rfl: 0   esomeprazole (NEXIUM) 40 MG capsule, Take 1 capsule (40 mg total) by mouth daily before breakfast., Disp: 30 capsule, Rfl: 10   fluticasone (FLONASE) 50 MCG/ACT nasal spray, Place 2 sprays into both nostrils daily., Disp: 16 g, Rfl: 2   ibuprofen (ADVIL) 200 MG tablet, Take 200 mg by mouth every 6 (six) hours as needed., Disp: , Rfl:    levocetirizine (XYZAL) 5 MG tablet, Take 1 tablet (5 mg total) by mouth every evening., Disp: 30 tablet, Rfl: 11   montelukast (SINGULAIR) 10 MG tablet, Take 1 tablet (10 mg total) by mouth at bedtime., Disp: 90 tablet, Rfl: 1   Multiple Vitamin (MULTIVITAMIN) tablet, Take 1 tablet by mouth daily., Disp: , Rfl:    ondansetron (ZOFRAN) 4 MG tablet, TAKE 1 TABLET(4 MG) BY MOUTH EVERY 8 HOURS AS NEEDED FOR NAUSEA OR VOMITING, Disp: 20 tablet, Rfl: 0   polyethylene glycol (MIRALAX / GLYCOLAX) packet, Take 17 g by mouth daily. , Disp: , Rfl:    Sod Fluoride-Potassium Nitrate (PREVIDENT 5000 SENSITIVE) 1.1-5 % PSTE, Prevident 5000 Enamel Protect 1.1 %-5 % dental paste  BRUSH TEETH WITH PASTE FOR AT LEAST ONE MINUTE 2 TO 3 TIMES DAILY, Disp: , Rfl:   Observations/Objective: Patient is well-developed, well-nourished in no acute distress.  Resting comfortably  at home.  Head is normocephalic, atraumatic.  No labored breathing.  Speech is clear and coherent with logical content.  Patient is alert and oriented at baseline.  Deep dry cough noted during visit.  Assessment and Plan:  Ana Knapp in today with chief  complaint of No chief complaint on file.   1. Cough in adult patient 1. Take meds as prescribed 2. Use a cool mist humidifier especially during the winter months and when heat has been humid. 3. Use saline nose sprays frequently 4. Saline irrigations of the nose can be very helpful if Knapp frequently.  * 4X daily for 1 week*  * Use of a nettie pot can be helpful with this. Follow directions with this* 5. Drink plenty of fluids 6. Keep thermostat turn down low 7.For any cough or congestion  Use plain Mucinex- regular strength or max strength is fine   * Children- consult with Pharmacist for dosing 8. For fever or aces or pains- take tylenol or ibuprofen appropriate for age and weight.  * for fevers greater than 101 orally you may alternate ibuprofen and tylenol every  3 hours.  She is allergic prednisone Keep follow up with your PCP   Meds ordered this encounter  Medications   albuterol (VENTOLIN HFA) 108 (90 Base) MCG/ACT inhaler    Sig: Inhale 2 puffs into the lungs every 6 (six) hours as needed for wheezing or shortness of breath.    Dispense:  8 g    Refill:  0    Order Specific Question:   Supervising Provider    Answer:   MILLER, BRIAN [[id number]]   benzonatate (TESSALON) 100 MG capsule    Sig: Take 1 capsule (100 mg total) by mouth 2 (two) times daily as needed for cough.    Dispense:  20 capsule    Refill:  0    Order Specific Question:   Supervising Provider    Answer:   MILLER, BRIAN [[id number]]   azithromycin (ZITHROMAX Z-PAK) 250 MG tablet    Sig: As directed    Dispense:  6 tablet    Refill:  0    Order Specific Question:   Supervising Provider    Answer:   Noemi Chapel [[id number]]         Follow Up Instructions: I discussed the assessment and treatment plan with the patient. The patient was provided an opportunity to ask questions and all were answered. The patient agreed with the plan and demonstrated an understanding of the instructions.  A copy of instructions  were sent to the patient via MyChart.  The patient was advised to call back or seek an in-person evaluation if the symptoms worsen or if the condition fails to improve as anticipated.  Time:  I spent 12 minutes with the patient via telehealth technology discussing the above problems/concerns.    Ana Hassell Done, FNP

## 2021-07-06 ENCOUNTER — Ambulatory Visit: Payer: Self-pay

## 2021-07-06 ENCOUNTER — Telehealth: Payer: Self-pay | Admitting: Gastroenterology

## 2021-07-06 ENCOUNTER — Emergency Department
Admission: EM | Admit: 2021-07-06 | Discharge: 2021-07-06 | Disposition: A | Discharge status: Home / Self Care | Payer: Self-pay | Attending: Emergency Medicine | Admitting: Emergency Medicine

## 2021-07-06 ENCOUNTER — Ambulatory Visit: Payer: Self-pay | Admitting: Nurse Practitioner

## 2021-07-06 ENCOUNTER — Other Ambulatory Visit: Payer: Self-pay

## 2021-07-06 ENCOUNTER — Emergency Department: Payer: Self-pay

## 2021-07-06 ENCOUNTER — Encounter: Payer: Self-pay | Admitting: Nurse Practitioner

## 2021-07-06 VITALS — BP 112/68 | HR 100 | Temp 98.1°F | Resp 18 | Ht 69.0 in | Wt 161.8 lb

## 2021-07-06 DIAGNOSIS — R109 Unspecified abdominal pain: Secondary | ICD-10-CM

## 2021-07-06 DIAGNOSIS — Z7982 Long term (current) use of aspirin: Secondary | ICD-10-CM | POA: Insufficient documentation

## 2021-07-06 DIAGNOSIS — R10811 Right upper quadrant abdominal tenderness: Secondary | ICD-10-CM | POA: Insufficient documentation

## 2021-07-06 DIAGNOSIS — R110 Nausea: Secondary | ICD-10-CM | POA: Insufficient documentation

## 2021-07-06 DIAGNOSIS — M545 Low back pain, unspecified: Secondary | ICD-10-CM

## 2021-07-06 DIAGNOSIS — R10813 Right lower quadrant abdominal tenderness: Secondary | ICD-10-CM | POA: Insufficient documentation

## 2021-07-06 LAB — URINALYSIS, COMPLETE (UACMP) WITH MICROSCOPIC
Bilirubin Urine: NEGATIVE
Glucose, UA: NEGATIVE mg/dL
Hgb urine dipstick: NEGATIVE
Ketones, ur: 20 mg/dL — AB
Nitrite: NEGATIVE
Protein, ur: NEGATIVE mg/dL
Specific Gravity, Urine: 1.019 (ref 1.005–1.030)
pH: 5 (ref 5.0–8.0)

## 2021-07-06 LAB — COMPREHENSIVE METABOLIC PANEL
ALT: 17 U/L (ref 0–44)
AST: 19 U/L (ref 15–41)
Albumin: 4.5 g/dL (ref 3.5–5.0)
Alkaline Phosphatase: 28 U/L — ABNORMAL LOW (ref 38–126)
Anion gap: 12 (ref 5–15)
BUN: 11 mg/dL (ref 6–20)
CO2: 22 mmol/L (ref 22–32)
Calcium: 9.3 mg/dL (ref 8.9–10.3)
Chloride: 102 mmol/L (ref 98–111)
Creatinine, Ser: 0.78 mg/dL (ref 0.44–1.00)
GFR, Estimated: >60 mL/min (ref >60)
Glucose, Bld: 99 mg/dL (ref 70–99)
Potassium: 3.7 mmol/L (ref 3.5–5.1)
Sodium: 136 mmol/L (ref 135–145)
Total Bilirubin: 0.7 mg/dL (ref 0.3–1.2)
Total Protein: 7.7 g/dL (ref 6.5–8.1)

## 2021-07-06 LAB — CBC
HCT: 40.4 % (ref 36.0–46.0)
Hemoglobin: 14.1 g/dL (ref 12.0–15.0)
MCH: 30.6 pg (ref 26.0–34.0)
MCHC: 34.9 g/dL (ref 30.0–36.0)
MCV: 87.6 fL (ref 80.0–100.0)
Platelets: 219 10*3/uL (ref 150–400)
RBC: 4.61 MIL/uL (ref 3.87–5.11)
RDW: 12.3 % (ref 11.5–15.5)
WBC: 5.2 10*3/uL (ref 4.0–10.5)
nRBC: 0 % (ref 0.0–0.2)

## 2021-07-06 LAB — POCT URINALYSIS DIPSTICK
Bilirubin, UA: NEGATIVE
Glucose, UA: NEGATIVE
Ketones, UA: NEGATIVE
Nitrite, UA: NEGATIVE
Protein, UA: POSITIVE — AB
Spec Grav, UA: 1.02 (ref 1.010–1.025)
Urobilinogen, UA: 0.2 E.U./dL
pH, UA: 5 (ref 5.0–8.0)

## 2021-07-06 LAB — POC URINE PREG, ED: Preg Test, Ur: NEGATIVE

## 2021-07-06 LAB — LIPASE, BLOOD: Lipase: 32 U/L (ref 11–51)

## 2021-07-06 MED ORDER — POLYETHYLENE GLYCOL 3350 17 GM/SCOOP PO POWD: 17.0000 g | Freq: Two times a day (BID) | ORAL | 0 refills | Status: DC | PRN

## 2021-07-06 MED ORDER — DICYCLOMINE HCL 10 MG PO CAPS: 10.0000 mg | ORAL_CAPSULE | Freq: Three times a day (TID) | ORAL | 0 refills | Status: DC | PRN

## 2021-07-06 MED ORDER — IOHEXOL 350 MG/ML SOLN
80.0000 mL | Freq: Once | INTRAVENOUS | Status: AC | PRN
  Administered 2021-07-06: 80 mL via INTRAVENOUS
  Filled 2021-07-06: qty 80

## 2021-07-06 NOTE — ED Provider Notes (Signed)
[facility] Emergency Department Provider Note  Time seen: 4:29 PM  I have reviewed the triage vital signs and the nursing notes.   HISTORY  Chief Complaint Abdominal Pain (Rlq PAIN )   HPI Ana Knapp is a 40 y.o. female with a past medical history of fibromyalgia, gastric reflux, presents to the emergency department for right-sided abdominal pain.  According to the patient over the past 2 to 3 months she has been experiencing intermittent pain in the right abdomen but this has progressively worsened over the past 3 to 4 days.  Patient denies any vomiting but does state nausea at times.  States she has had intermittent diarrhea as well but none currently.  No dysuria.  No vaginal bleeding or discharge.  Patient is status postcholecystectomy multiple years ago per patient.   Past Medical History:  Diagnosis Date   Chronic sinusitis    Fibromyalgia    GERD (gastroesophageal reflux disease)    Headache    sinus   History of melanoma 11/22/2017   History of melanoma 2018   Back   May-Thurner syndrome 11/30/2015   January 2017; 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[facility]    PONV (postoperative nausea and vomiting)    Pulmonary embolism [facility])     Patient Active Problem List   Diagnosis Date Noted   Endometriosis 03/01/2018   Abnormal CT scan    History of melanoma 11/22/2017   Biliary dyskinesia 12/05/2016   Constipation 08/18/2016   May-Thurner syndrome 11/30/2015   DVT (deep venous thrombosis) ([place]) 11/13/2015   History of pulmonary embolism 11/13/2015   GERD (gastroesophageal reflux disease) 09/28/2015   Flushing 09/24/2015   Fibromyalgia 08/30/2015   Seasonal allergic rhinitis 08/30/2015   Fibrositis 08/30/2015    Past Surgical History:  Procedure Laterality Date   ANGIOPLASTY / STENTING FEMORAL  March 2017   CHOLECYSTECTOMY     COLONOSCOPY WITH PROPOFOL N/A 01/02/2018   Procedure: COLONOSCOPY WITH PROPOFOL;  Surgeon: Lucilla Lame, MD;  Location: [facility]  ENDOSCOPY;  Service: Endoscopy;  Laterality: N/A;   ESOPHAGOGASTRODUODENOSCOPY (EGD) WITH PROPOFOL N/A 01/02/2018   Procedure: ESOPHAGOGASTRODUODENOSCOPY (EGD) WITH PROPOFOL;  Surgeon: Lucilla Lame, MD;  Location: [facility] ENDOSCOPY;  Service: Endoscopy;  Laterality: N/A;   LAPAROSCOPY N/A 02/26/2018   Procedure: LAPAROSCOPY DIAGNOSTIC WITH BIOPSIES;  Surgeon: Rubie Maid, MD;  Location: [facility] ORS;  Service: Gynecology;  Laterality: N/A;   Lytics Catheter Placement  11/16/15   MELANOMA EXCISION  2018   Back   ROBOTIC ASSISTED LAPAROSCOPIC CHOLECYSTECTOMY-SINGLE SITE  12/29/2016    Prior to Admission medications   Medication Sig Start Date End Date Taking? Authorizing Provider  acetaminophen (TYLENOL) 325 MG tablet Take 650 mg by mouth every 6 (six) hours as needed.    [provider]  albuterol (VENTOLIN HFA) 108 (90 Base) MCG/ACT inhaler Inhale 2 puffs into the lungs every 6 (six) hours as needed for wheezing or shortness of breath. Patient not taking: Reported on 07/06/2021 07/02/21   Chevis Pretty, FNP  aspirin 81 MG EC tablet Take 1 tablet by mouth daily. 12/28/15   [provider]  azithromycin (ZITHROMAX Z-PAK) 250 MG tablet As directed Patient not taking: Reported on 07/06/2021 07/02/21   Chevis Pretty, FNP  benzonatate (TESSALON) 100 MG capsule Take 1 capsule (100 mg total) by mouth 2 (two) times daily as needed for cough. Patient not taking: Reported on 07/06/2021 07/02/21   Chevis Pretty, FNP  diclofenac Sodium (VOLTAREN) 1 % GEL Apply 4 g topically 4 (four) times daily.  01/27/21   Steele Sizer, MD  doxycycline (VIBRA-TABS) 100 MG tablet Take 1 tablet (100 mg total) by mouth 2 (two) times daily. Patient not taking: Reported on 07/06/2021 05/24/21   Chevis Pretty, FNP  esomeprazole (NEXIUM) 40 MG capsule Take 1 capsule (40 mg total) by mouth daily before breakfast. 09/07/20   Lucilla Lame, MD  fluticasone (FLONASE) 50 MCG/ACT nasal spray Place 2 sprays  into both nostrils daily. 02/26/21   Steele Sizer, MD  ibuprofen (ADVIL) 200 MG tablet Take 200 mg by mouth every 6 (six) hours as needed. Patient not taking: Reported on 07/06/2021    [provider]  levocetirizine (XYZAL) 5 MG tablet Take 1 tablet (5 mg total) by mouth every evening. 01/16/20   Ancil Boozer, Drue Stager, MD  montelukast (SINGULAIR) 10 MG tablet Take 1 tablet (10 mg total) by mouth at bedtime. 01/27/21   Steele Sizer, MD  Multiple Vitamin (MULTIVITAMIN) tablet Take 1 tablet by mouth daily.    [provider]  ondansetron (ZOFRAN) 4 MG tablet TAKE 1 TABLET(4 MG) BY MOUTH EVERY 8 HOURS AS NEEDED FOR NAUSEA OR VOMITING 05/14/21   Ancil Boozer, Drue Stager, MD  polyethylene glycol (MIRALAX / GLYCOLAX) packet Take 17 g by mouth daily.     [provider]  Sod Fluoride-Potassium Nitrate (PREVIDENT 5000 SENSITIVE) 1.1-5 % PSTE Prevident 5000 Enamel Protect 1.1 %-5 % dental paste  BRUSH TEETH WITH PASTE FOR AT LEAST ONE MINUTE 2 TO 3 TIMES DAILY 11/17/19   [provider]    Allergies  Allergen Reactions   Ortho Tri-Cyclen [Norgestimate-Eth Estradiol] Other (See Comments)    Blood clot and PE   Prednisone Anaphylaxis   Augmentin [Amoxicillin-Pot Clavulanate] Other (See Comments)    Upsets her stomach    Family History  Problem Relation Age of Onset   Thyroid disease Mother    Diabetes Mother    Cancer Father        bladder   Bladder Cancer Father    Hypertension Maternal Grandmother    Hypothyroidism Maternal Grandmother    Thyroid disease Paternal Grandmother    Diabetes Paternal Grandmother    Stroke Paternal Grandmother    Cancer Paternal Grandfather        skin   Heart disease Neg Hx    COPD Neg Hx     Social History Social History   Tobacco Use   Smoking status: Never   Smokeless tobacco: Never  Vaping Use   Vaping Use: Never used  Substance Use Topics   Alcohol use: Not Currently    Comment: rare   Drug use: No    Review of  Systems Constitutional: Negative for fever. Cardiovascular: Negative for chest pain. Respiratory: Negative for shortness of breath. Gastrointestinal: Right-sided abdominal pain.  Positive nausea but negative for vomiting or diarrhea.  Describes the abdominal pain as moderate dull/aching. Genitourinary: Negative for urinary compaints Musculoskeletal: Negative for musculoskeletal complaints Neurological: Negative for headache All other ROS negative  ____________________________________________   PHYSICAL EXAM:  VITAL SIGNS: ED Triage Vitals [07/06/21 1409]  Enc Vitals Group     BP 130/75     Pulse Rate 89     Resp 17     Temp 97.9 F (36.6 C)     Temp Source Oral     SpO2 98 %     Weight 165 lb 5.5 oz (75 kg)     Height '5\' 9"'$  (1.753 m)     Head Circumference      Peak Flow  Pain Score 7     Pain Loc      Pain Edu?      Excl. in [place]?    Constitutional: Alert and oriented. Well appearing and in no distress. Eyes: Normal exam ENT      Head: Normocephalic and atraumatic.      Mouth/Throat: Mucous membranes are moist. Cardiovascular: Normal rate, regular rhythm.  Respiratory: Normal respiratory effort without tachypnea nor retractions. Breath sounds are clear  Gastrointestinal: Soft, mild right-sided abdominal tenderness in the right upper mid and lower abdomen.  No left-sided tenderness.  No rebound or guarding.  No distention. Musculoskeletal: Nontender with normal range of motion in all extremities.  Neurologic:  Normal speech and language. No gross focal neurologic deficits Skin:  Skin is warm, dry and intact.  Psychiatric: Mood and affect are normal.   ____________________________________________   RADIOLOGY  CT scan shows constipation otherwise negative.  ____________________________________________   INITIAL IMPRESSION / ASSESSMENT AND PLAN / ED COURSE  Pertinent labs & imaging results that were available during my care of the patient were reviewed by me  and considered in my medical decision making (see chart for details).   Patient presents emergency department for abdominal pain which has been mostly right-sided.  Intermittent times several months but worse over the past 1 week.  Patient's lab work is thus far reassuring including a normal white blood cell count, normal LFTs and lipase.  Urinalysis is pending.  Patient is status postcholecystectomy but given the patient's right-sided abdominal discomfort we will proceed with CT imaging to further evaluate.  Patient agreeable to plan of care.  Patient CT scan abdomen/pelvis is overall reassuring besides moderate constipation.  We will prescribe MiraLAX and have the patient follow-up with her doctor.  We will also prescribe Bentyl given the patient's abdominal discomfort.  Patient agreeable to plan of care.  Ana Knapp was evaluated in Emergency Department on 07/06/2021 for the symptoms described in the history of present illness. She was evaluated in the context of the global COVID-19 pandemic, which necessitated consideration that the patient might be at risk for infection with the SARS-CoV-2 virus that causes COVID-19. Institutional protocols and algorithms that pertain to the evaluation of patients at risk for COVID-19 are in a state of rapid change based on information released by regulatory bodies including the CDC and federal and state organizations. These policies and algorithms were followed during the patient's care in the ED.  ____________________________________________   FINAL CLINICAL IMPRESSION(S) / ED DIAGNOSES  Abdominal pain    Harvest Dark, MD 07/06/21 1756

## 2021-07-06 NOTE — Progress Notes (Addendum)
   BP 112/68   Pulse 100   Temp 98.1 F (36.7 C) (Oral)   Resp 18   Ht '5\' 9"'$  (1.753 m)   Wt 161 lb 12.8 oz (73.4 kg)   LMP 06/22/2021   SpO2 98%   BMI 23.89 kg/m    Subjective:    Patient ID: Ana Knapp, female    DOB: [birth year], 40 y.o.   MRN: [id number]  HPI: Ana Knapp is a 40 y.o. female, here alone  Chief Complaint  Patient presents with   Back Pain    Right lower area   Abdominal pain:  Started on Saturday, she reports sharp right lower quadrant abdominal pain.  She says the pain seems to radiate through to her back. She says it has increased in intensity since Saturday.  Pain is an 8/10, described as sharp.  She says she has had a fever of 101. She also reports nausea, loss of appetite.  She denies vomiting or change in bowel habits.  She denies any urinary frequency, urgency or dysuria. She denies any vaginal bleeding or discharge.  Discussed possible differentials with patient.  Cannot rule out appendicitis, sent patient to emergency department.  Patient is agreeable to plan.    Relevant past medical, surgical, family and social history reviewed and updated as indicated. Interim medical history since our last visit reviewed. Allergies and medications reviewed and updated.  Review of Systems  Constitutional: Positive for fever, negative for weight change.  Respiratory: Negative for cough and shortness of breath.   Cardiovascular: Negative for chest pain or palpitations.  Gastrointestinal: Positive for abdominal pain, no bowel changes.  Musculoskeletal: Negative for gait problem or joint swelling.  Skin: Negative for rash.  Neurological: Negative for dizziness or headache.  No other specific complaints in a complete review of systems (except as listed in HPI above).      Objective:    BP 112/68   Pulse 100   Temp 98.1 F (36.7 C) (Oral)   Resp 18   Ht '5\' 9"'$  (1.753 m)   Wt 161 lb 12.8 oz (73.4 kg)   LMP 06/22/2021   SpO2 98%   BMI 23.89  kg/m   Wt Readings from Last 3 Encounters:  07/06/21 161 lb 12.8 oz (73.4 kg)  04/13/21 164 lb 12.8 oz (74.8 kg)  02/26/21 170 lb (77.1 kg)    Physical Exam  Constitutional: Patient appears well-developed and well-nourished. No distress.  HEENT: head atraumatic, normocephalic, pupils equal and reactive to light, neck supple Cardiovascular: tachycardic rate, regular rhythm and normal heart sounds.  No murmur heard. No BLE edema. Pulmonary/Chest: Effort normal and breath sounds normal. No respiratory distress. Abdominal: Soft.  Rebound tenderness noted to the right lower quadrant, positive Rovsing's sign, no CVA tenderness Psychiatric: Patient has a normal mood and affect. behavior is normal. Judgment and thought content normal.   Results for orders placed or performed in visit on 04/05/21  TB Skin Test  Result Value Ref Range   TB Skin Test Negative    Induration 0 mm      Assessment & Plan:   1. Acute RLQ abdominal pain - patient sent to emergency room (notified [facility] of patient) - POCT urinalysis dipstick   Follow up plan: No follow-ups on file.

## 2021-07-06 NOTE — Telephone Encounter (Signed)
Pt. Requesting a call back she says she does not feel that her medicine is working her condition is not improving

## 2021-07-06 NOTE — ED Triage Notes (Signed)
RLQ PAIN SINCE Saturday , NAUSEA, NO VOMITING

## 2021-07-06 NOTE — Telephone Encounter (Signed)
Pt c/o moderate pelvic, abdominal and lower back pain. Pain starts at the pelvic area and radiates to the right side of abdomen to the lower right side of back and down the right leg. Pt stated she has had the pain since August but "was tolerable." Pt states that her abdomen is bloated and hurts to touch. Pt c/o acid reflux, loss of appetite due to the pain when she begins to eat.  Pt has been treating the pain with Tylenol which made the pain more tolerable. Pt stated that she thinks the pain is from her fibroids.  Care advice given and pt verbalized understanding. Asked pt to be NPO starting at 1100. Pt has an appt today at 1340. Advised pt to go to ED or call 911 if pain becomes severe. Pt verbalized understanding.           Reason for Disposition  [1] Vomiting AND [2] abdomen looks much more swollen than usual  Answer Assessment - Initial Assessment Questions 1. LOCATION: "Where does it hurt?"      From pelvic area to right side of abdomen to the side of lower back 2. RADIATION: "Does the pain shoot anywhere else?" (e.g., chest, back)    Back and right leg 3. ONSET: "When did the pain begin?" (e.g., minutes, hours or days ago)      Pt stated that the pain has been since August and became severe on Saturday 4. SUDDEN: "Gradual or sudden onset?"     gradually 5. PATTERN "Does the pain come and go, or is it constant?"    - If constant: "Is it getting better, staying the same, or worsening?"      (Note: Constant means the pain never goes away completely; most serious pain is constant and it progresses)     - If intermittent: "How long does it last?" "Do you have pain now?"     (Note: Intermittent means the pain goes away completely between bouts)     Comes and goes- few hours-worsening- yes-moderate 6. SEVERITY: "How bad is the pain?"  (e.g., Scale 1-10; mild, moderate, or severe)   - MILD (1-3): doesn't interfere with normal activities, abdomen soft and not tender to touch    -  MODERATE (4-7): interferes with normal activities or awakens from sleep, abdomen tender to touch    - SEVERE (8-10): excruciating pain, doubled over, unable to do any normal activities      moderate 7. RECURRENT SYMPTOM: "Have you ever had this type of stomach pain before?" If Yes, ask: "When was the last time?" and "What happened that time?"      Yes-Summer 2022-no tx 8. CAUSE: "What do you think is causing the stomach pain?"     fibroids 9. RELIEVING/AGGRAVATING FACTORS: "What makes it better or worse?" (e.g., movement, antacids, bowel movement)     Better: Tylenol        Worse: eating 10. OTHER SYMPTOMS: "Do you have any other symptoms?" (e.g., back pain, diarrhea, fever, urination pain, vomiting)       Acid reflux, back pain lossappetite SAturday 11. PREGNANCY: "Is there any chance you are pregnant?" "When was your last menstrual period?"       no  Answer Assessment - Initial Assessment Questions 1. ONSET: "When did the pain begin?"      Saturday has before but pain is tolerable-99.5 2. LOCATION: "Where does it hurt?" (upper, mid or lower back)     Pelvic area to the right side of abdomen to  the lower back 3. SEVERITY: "How bad is the pain?"  (e.g., Scale 1-10; mild, moderate, or severe)   - MILD (1-3): doesn't interfere with normal activities    - MODERATE (4-7): interferes with normal activities or awakens from sleep    - SEVERE (8-10): excruciating pain, unable to do any normal activities      moderate 4. PATTERN: "Is the pain constant?" (e.g., yes, no; constant, intermittent)      Comes and goes  5. RADIATION: "Does the pain shoot into your legs or elsewhere?"     Right leg pain 6. CAUSE:  "What do you think is causing the back pain?"      Uterine fibroids 7. BACK OVERUSE:  "Any recent lifting of heavy objects, strenuous work or exercise?"     no 8. MEDICATIONS: "What have you taken so far for the pain?" (e.g., nothing, acetaminophen, NSAIDS)     Tylenol 9. NEUROLOGIC  SYMPTOMS: "Do you have any weakness, numbness, or problems with bowel/bladder control?"     no 10. OTHER SYMPTOMS: "Do you have any other symptoms?" (e.g., fever, abdominal pain, burning with urination, blood in urine)       Right side 11. PREGNANCY: "Is there any chance you are pregnant?" (e.g., yes, no; LMP)       No [id number]  Protocols used: Back Pain-A-AH, Abdominal Pain - Female-A-AH

## 2021-07-13 ENCOUNTER — Telehealth: Payer: Self-pay | Admitting: Gastroenterology

## 2021-07-14 ENCOUNTER — Telehealth (INDEPENDENT_AMBULATORY_CARE_PROVIDER_SITE_OTHER): Payer: Self-pay | Admitting: Family Medicine

## 2021-07-14 ENCOUNTER — Other Ambulatory Visit: Payer: Self-pay

## 2021-07-14 ENCOUNTER — Encounter: Payer: Self-pay | Admitting: Family Medicine

## 2021-07-14 VITALS — Ht 69.0 in | Wt 161.0 lb

## 2021-07-14 DIAGNOSIS — Z8349 Family history of other endocrine, nutritional and metabolic diseases: Secondary | ICD-10-CM

## 2021-07-14 DIAGNOSIS — N945 Secondary dysmenorrhea: Secondary | ICD-10-CM

## 2021-07-14 DIAGNOSIS — K5909 Other constipation: Secondary | ICD-10-CM

## 2021-07-14 DIAGNOSIS — M797 Fibromyalgia: Secondary | ICD-10-CM

## 2021-07-14 DIAGNOSIS — M25561 Pain in right knee: Secondary | ICD-10-CM

## 2021-07-14 DIAGNOSIS — G8929 Other chronic pain: Secondary | ICD-10-CM

## 2021-07-14 DIAGNOSIS — J3089 Other allergic rhinitis: Secondary | ICD-10-CM

## 2021-07-14 DIAGNOSIS — K219 Gastro-esophageal reflux disease without esophagitis: Secondary | ICD-10-CM

## 2021-07-14 DIAGNOSIS — N809 Endometriosis, unspecified: Secondary | ICD-10-CM

## 2021-07-14 DIAGNOSIS — D259 Leiomyoma of uterus, unspecified: Secondary | ICD-10-CM

## 2021-07-14 DIAGNOSIS — I82512 Chronic embolism and thrombosis of left femoral vein: Secondary | ICD-10-CM

## 2021-07-14 DIAGNOSIS — I871 Compression of vein: Secondary | ICD-10-CM

## 2021-07-14 DIAGNOSIS — J302 Other seasonal allergic rhinitis: Secondary | ICD-10-CM

## 2021-07-14 MED ORDER — ESOMEPRAZOLE MAGNESIUM 40 MG PO CPDR: 40.0000 mg | DELAYED_RELEASE_CAPSULE | Freq: Two times a day (BID) | ORAL | 6 refills | Status: DC

## 2021-07-14 MED ORDER — ONDANSETRON HCL 4 MG PO TABS: 4.0000 mg | ORAL_TABLET | Freq: Three times a day (TID) | ORAL | 0 refills | Status: DC | PRN

## 2021-07-14 MED ORDER — DICLOFENAC SODIUM 1 % EX GEL: 4.0000 g | Freq: Four times a day (QID) | CUTANEOUS | 1 refills | Status: DC

## 2021-07-14 MED ORDER — MONTELUKAST SODIUM 10 MG PO TABS
10.0000 mg | ORAL_TABLET | Freq: Every day | ORAL | 1 refills | Status: DC
Start: 2021-07-14 — End: 2022-01-31

## 2021-07-14 NOTE — Telephone Encounter (Signed)
Pt notified of the increase in her Nexium 40mg  to twice daily per Dr. Allen Norris.

## 2021-07-14 NOTE — Progress Notes (Signed)
Name: Ana Knapp   MRN: [id number]    DOB: June 13, 1981   Date:07/14/2021       Progress Note  Subjective  Chief Complaint  Chief Complaint  Patient presents with   Follow-up    I connected with  [name]  on 07/14/21 at  3:00 PM EDT by a video enabled telemedicine application and verified that I am speaking with the correct person using two identifiers.  I discussed the limitations of evaluation and management by telemedicine and the availability of in person appointments. The patient expressed understanding and agreed to proceed with the virtual visit  Staff also discussed with the patient that there may be a patient responsible charge related to this service. Patient Location: at work  Provider Location: at home  Additional Individuals present: alone   HPI  Perennial AR: stable, takes Xyzal and also singulair. She states occasionally has to take claritin D for nasal congestion, discussed nasal spray is safer.   GERD: she is under the care of Dr. Allen Norris, she states her symptoms are much worse lately, she has been having regurgitation and having some dysphagia, and contacted him to increase dose to BID    Chronic constipation: she was given Trulance but stopped taking it because it caused diarrhea. She went to [facility] with abdominal pain and has been taking it daily one cap full and having bowel movements every other day, no straining and abdominal pain is mild now    Family history of thyroid disease: mother has hypothyroidism TSH level has been within normal limits    Chronic DVT left leg: diagnosed at age 61, she was on ocp at the time, she was also diagnosed with May Thurner syndrome, had a stent placed at [facility]io femoral vein back 2017 last visit at [facility] was in 2021 and she will go back in 2024 . She takes aspirin 81 mg daily, she still has lower left leg swelling but stable.    FMS: seen by rheumatologist in [place] when she was 40 yo and diagnosed with FMS. She states she  is aching all the time. She tried muscle relaxer, baclofen, but she did not like the way it made her feel.   Fibroid/endometriosis/dysmenorrhea: still has cycles every month and has cramping and nausea, takes zofran prn  Right knee pain: she is using stairs daily on her new job and would like a refill of voltaren gel      Patient Active Problem List   Diagnosis Date Noted   Endometriosis 03/01/2018   Abnormal CT scan    History of melanoma 11/22/2017   Biliary dyskinesia 12/05/2016   Constipation 08/18/2016   May-Thurner syndrome 11/30/2015   DVT (deep venous thrombosis) ([place]) 11/13/2015   History of pulmonary embolism 11/13/2015   GERD (gastroesophageal reflux disease) 09/28/2015   Flushing 09/24/2015   Fibromyalgia 08/30/2015   Seasonal allergic rhinitis 08/30/2015   Fibrositis 08/30/2015    Past Surgical History:  Procedure Laterality Date   ANGIOPLASTY / STENTING FEMORAL  March 2017   CHOLECYSTECTOMY     COLONOSCOPY WITH PROPOFOL N/A 01/02/2018   Procedure: COLONOSCOPY WITH PROPOFOL;  Surgeon: Lucilla Lame, MD;  Location: [facility] ENDOSCOPY;  Service: Endoscopy;  Laterality: N/A;   ESOPHAGOGASTRODUODENOSCOPY (EGD) WITH PROPOFOL N/A 01/02/2018   Procedure: ESOPHAGOGASTRODUODENOSCOPY (EGD) WITH PROPOFOL;  Surgeon: Lucilla Lame, MD;  Location: [facility] ENDOSCOPY;  Service: Endoscopy;  Laterality: N/A;   LAPAROSCOPY N/A 02/26/2018   Procedure: LAPAROSCOPY DIAGNOSTIC WITH BIOPSIES;  Surgeon: Rubie Maid, MD;  Location: [facility] ORS;  Service: Gynecology;  Laterality: N/A;   Lytics Catheter Placement  11/16/15   MELANOMA EXCISION  2018   Back   ROBOTIC ASSISTED LAPAROSCOPIC CHOLECYSTECTOMY-SINGLE SITE  12/29/2016    Family History  Problem Relation Age of Onset   Thyroid disease Mother    Diabetes Mother    Cancer Father        bladder   Bladder Cancer Father    Hypertension Maternal Grandmother    Hypothyroidism Maternal Grandmother    Thyroid disease Paternal Grandmother     Diabetes Paternal Grandmother    Stroke Paternal Grandmother    Cancer Paternal Grandfather        skin   Heart disease Neg Hx    COPD Neg Hx         Current Outpatient Medications:    acetaminophen (TYLENOL) 325 MG tablet, Take 650 mg by mouth every 6 (six) hours as needed., Disp: , Rfl:    aspirin 81 MG EC tablet, Take 1 tablet by mouth daily., Disp: , Rfl:    diclofenac Sodium (VOLTAREN) 1 % GEL, Apply 4 g topically 4 (four) times daily., Disp: 200 g, Rfl: 1   dicyclomine (BENTYL) 10 MG capsule, Take 1 capsule (10 mg total) by mouth 3 (three) times daily as needed for up to 14 days for spasms., Disp: 20 capsule, Rfl: 0   esomeprazole (NEXIUM) 40 MG capsule, Take 1 capsule (40 mg total) by mouth daily before breakfast., Disp: 30 capsule, Rfl: 10   fluticasone (FLONASE) 50 MCG/ACT nasal spray, Place 2 sprays into both nostrils daily., Disp: 16 g, Rfl: 2   ibuprofen (ADVIL) 200 MG tablet, Take 200 mg by mouth every 6 (six) hours as needed., Disp: , Rfl:    montelukast (SINGULAIR) 10 MG tablet, Take 1 tablet (10 mg total) by mouth at bedtime., Disp: 90 tablet, Rfl: 1   Multiple Vitamin (MULTIVITAMIN) tablet, Take 1 tablet by mouth daily., Disp: , Rfl:    ondansetron (ZOFRAN) 4 MG tablet, TAKE 1 TABLET(4 MG) BY MOUTH EVERY 8 HOURS AS NEEDED FOR NAUSEA OR VOMITING, Disp: 20 tablet, Rfl: 0   polyethylene glycol powder (GLYCOLAX/MIRALAX) 17 GM/SCOOP powder, Take 17 g by mouth 2 (two) times daily as needed for moderate constipation., Disp: 255 g, Rfl: 0   Sod Fluoride-Potassium Nitrate (PREVIDENT 5000 SENSITIVE) 1.1-5 % PSTE, Prevident 5000 Enamel Protect 1.1 %-5 % dental paste  BRUSH TEETH WITH PASTE FOR AT LEAST ONE MINUTE 2 TO 3 TIMES DAILY, Disp: , Rfl:    levocetirizine (XYZAL) 5 MG tablet, Take 1 tablet (5 mg total) by mouth every evening. (Patient not taking: Reported on 07/14/2021), Disp: 30 tablet, Rfl: 11  Allergies  Allergen Reactions   Ortho Tri-Cyclen [Norgestimate-Eth Estradiol]  Other (See Comments)    Blood clot and PE   Prednisone Anaphylaxis   Augmentin [Amoxicillin-Pot Clavulanate] Other (See Comments)    Upsets her stomach    I personally reviewed active problem list, medication list, allergies with the patient/caregiver today.   ROS  Ten systems reviewed and is negative except as mentioned in HPI   Objective  Virtual encounter, vitals not obtained.  Body mass index is 23.78 kg/m.  Physical Exam  Awake, alert and oriented   PHQ2/9: Depression screen [facility] 2/9 07/14/2021 07/06/2021 04/13/2021 02/26/2021 01/27/2021  Decreased Interest 0 0 0 0 0  Down, Depressed, Hopeless 0 0 2 0 0  PHQ - 2 Score 0 0 2 0 0  Altered sleeping - -  0 0 0  Tired, decreased energy - - 2 0 0  Change in appetite - - 0 0 0  Feeling bad or failure about yourself  - - 0 0 0  Trouble concentrating - - 0 0 0  Moving slowly or fidgety/restless - - 0 0 0  Suicidal thoughts - - 0 0 0  PHQ-9 Score - - 4 0 0  Difficult doing work/chores - - Somewhat difficult - -  Some recent data might be hidden   PHQ-2/9 Result is negative.    Fall Risk: Fall Risk  07/14/2021 07/06/2021 02/26/2021 01/27/2021 11/27/2020  Falls in the past year? 0 0 0 0 0  Number falls in past yr: 0 0 0 0 0  Injury with Fall? - 0 0 0 0  Follow up Falls prevention discussed Falls evaluation completed Falls prevention discussed - -     Assessment & Plan  1. Chronic constipation   2. GERD without esophagitis   3. Perennial allergic rhinitis with seasonal variation  - montelukast (SINGULAIR) 10 MG tablet; Take 1 tablet (10 mg total) by mouth at bedtime.  Dispense: 90 tablet; Refill: 1  4. Chronic deep vein thrombosis (DVT) of femoral vein of left lower extremity (HCC)   5. May-Thurner syndrome   6. Family history of thyroid disease   7. Fibromyalgia   8. Secondary dysmenorrhea  - ondansetron (ZOFRAN) 4 MG tablet; Take 1 tablet (4 mg total) by mouth every 8 (eight) hours as needed for nausea or  vomiting.  Dispense: 20 tablet; Refill: 0  9. Endometriosis  - ondansetron (ZOFRAN) 4 MG tablet; Take 1 tablet (4 mg total) by mouth every 8 (eight) hours as needed for nausea or vomiting.  Dispense: 20 tablet; Refill: 0  10. Uterine leiomyoma, unspecified location  - ondansetron (ZOFRAN) 4 MG tablet; Take 1 tablet (4 mg total) by mouth every 8 (eight) hours as needed for nausea or vomiting.  Dispense: 20 tablet; Refill: 0   I discussed the assessment and treatment plan with the patient. The patient was provided an opportunity to ask questions and all were answered. The patient agreed with the plan and demonstrated an understanding of the instructions.  The patient was advised to call back or seek an in-person evaluation if the symptoms worsen or if the condition fails to improve as anticipated.  I provided 25 minutes of non-face-to-face time during this encounter.

## 2021-07-24 ENCOUNTER — Other Ambulatory Visit: Payer: Self-pay | Admitting: Nurse Practitioner

## 2021-09-02 ENCOUNTER — Telehealth (INDEPENDENT_AMBULATORY_CARE_PROVIDER_SITE_OTHER): Payer: Self-pay | Admitting: Nurse Practitioner

## 2021-09-02 ENCOUNTER — Encounter: Payer: Self-pay | Admitting: Nurse Practitioner

## 2021-09-02 ENCOUNTER — Other Ambulatory Visit: Payer: Self-pay

## 2021-09-02 ENCOUNTER — Ambulatory Visit: Payer: Self-pay | Admitting: *Deleted

## 2021-09-02 VITALS — Temp 100.8°F

## 2021-09-02 DIAGNOSIS — J069 Acute upper respiratory infection, unspecified: Secondary | ICD-10-CM

## 2021-09-02 LAB — POCT INFLUENZA A/B
Influenza A, POC: NEGATIVE
Influenza B, POC: NEGATIVE

## 2021-09-02 NOTE — Addendum Note (Signed)
Addended by: Docia Furl on: 09/02/2021 11:14 AM   Modules accepted: Orders

## 2021-09-02 NOTE — Telephone Encounter (Signed)
Reason for Disposition  Fever present > 3 days (72 hours)    Coughing and URI symptoms.   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 TEMPERATURE: "What is the most recent temperature?"  "How was it measured?"      Pt calling in c/o a fever for 3 days.   100.8 this morning.   I thought the fever was gone because yesterday it was normal 98.7.   I'm taking Dayquil for fever and congestion.   I took Tylenol this morning to get it back down. 2. ONSET: "When did the fever start?"      3 days ago     Yesterday I did not have a fever only in the morning.   Now this morning 100.8. 3. CHILLS: "Do you have chills?" If yes: "How bad are they?"  (e.g., none, mild, moderate, severe)   - NONE: no chills   - MILD: feeling cold   - MODERATE: feeling very cold, some shivering (feels better under a thick blanket)   - SEVERE: feeling extremely cold with shaking chills (general body shaking, rigors; even under a thick blanket)      I'm having chills and breaking out in sweats.   My fever was 102 yesterday morning that's when I took the Dayquill. 4. OTHER SYMPTOMS: "Do you have any other symptoms besides the fever?"  (e.g., abdomen pain, cough, diarrhea, earache, headache, sore throat, urination pain)     No diarrhea. I'm coughing a lot, not feeling well and hurting all over.   I've not tested for Covid.   The school the flu is going around and RSV.     I'm having nasal congestion, scratchy throat, ear pain both ears, coughing a lot but nothing's coming up.   Body aches too.   No vomiting or diarrhea.   I felt some nausea.   Poor appetite.    5. CAUSE: If there are no symptoms, ask: "What do you think is causing the fever?"      I've been exposed to RSV and flu at the school.   No Covid but a lot of flu.   I work at the school. 6. CONTACTS: "Does anyone else in the family have an infection?"     No 7. TREATMENT: "What have you done so far to treat this fever?" (e.g.,  medications)     Tylenol and Dayquill  Nyquil at night. 8. IMMUNOCOMPROMISE: "Do you have of the following: diabetes, HIV positive, splenectomy, cancer chemotherapy, chronic steroid treatment, transplant patient, etc."     No problems.  I have scarring from a pulmonary embolism when I was 40 yrs old.   9. PREGNANCY: "Is there any chance you are pregnant?" "When was your last menstrual period?"     No 10. TRAVEL: "Have you traveled out of the country in the last month?" (e.g., travel history, exposures)       No   Exposure at school.  Protocols used: [facility]

## 2021-09-02 NOTE — Telephone Encounter (Signed)
Pt called in c/o having fever for 3 days and upper respiratory symptoms.   The coughing is her worst symptom.   It's a tight sounding cough without being productive.   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   She has not tested for Covid.    See triage notes.  I made her a MyChart Video visit for today at 11:40 with Serafina Royals, NP. I accidentally scheduled for Serafina Royals, NP which [facility] does not do yet.   I called into the office and the appt was ok to keep.  Notes sent to [facility]

## 2021-09-02 NOTE — Progress Notes (Addendum)
Name: Ana Knapp   MRN: [id number]    DOB: Apr 12, 1981   Date:09/02/2021       Progress Note  Subjective  Chief Complaint  Chief Complaint  Patient presents with   Influenza    Cough, chest hurts, fever, weak, muscle pain, fever fatigue for 2 days    I [name]  on 09/02/21 at 11:40 AM EST by a video enabled telemedicine application and verified that I am speaking with the correct person using two identifiers.  I discussed the limitations of evaluation and management by telemedicine and the availability of in person appointments. The patient expressed understanding and agreed to proceed with a virtual visit  Staff also discussed with the patient that there may be a patient responsible charge related to this service. Patient Location: home Provider Location: [facility] Additional Individuals present: alone  HPI  URI: She says that she started feeling bad two days ago.  She says she has had fever up to (102), fatigue, body aches and cough.  She denies any shortness of breath or chest pain. She has been treating symptoms with Dayquil, Nyquil and Tylenol.  Discussed other OTC treatments she can use. She says that she has been exposed to the Flu at work.  She is going to come by for a Flu and Covid test.  Discussed treatment of Tamiflu if positive and she is interested in treatment.    Patient Active Problem List   Diagnosis Date Noted   Endometriosis 03/01/2018   Abnormal CT scan    History of melanoma 11/22/2017   Biliary dyskinesia 12/05/2016   Constipation 08/18/2016   May-Thurner syndrome 11/30/2015   DVT (deep venous thrombosis) ([place]) 11/13/2015   History of pulmonary embolism 11/13/2015   GERD (gastroesophageal reflux disease) 09/28/2015   Flushing 09/24/2015   Fibromyalgia 08/30/2015   Seasonal allergic rhinitis 08/30/2015   Fibrositis 08/30/2015    Social History   Tobacco Use   Smoking status: Never   Smokeless tobacco: Never  Substance  Use Topics   Alcohol use: Not Currently    Comment: rare     Current Outpatient Medications:    acetaminophen (TYLENOL) 325 MG tablet, Take 650 mg by mouth every 6 (six) hours as needed., Disp: , Rfl:    aspirin 81 MG EC tablet, Take 1 tablet by mouth daily., Disp: , Rfl:    diclofenac Sodium (VOLTAREN) 1 % GEL, Apply 4 g topically 4 (four) times daily., Disp: 200 g, Rfl: 1   esomeprazole (NEXIUM) 40 MG capsule, Take 1 capsule (40 mg total) by mouth 2 (two) times daily before a meal., Disp: 60 capsule, Rfl: 6   fluticasone (FLONASE) 50 MCG/ACT nasal spray, Place 2 sprays into both nostrils daily., Disp: 16 g, Rfl: 2   montelukast (SINGULAIR) 10 MG tablet, Take 1 tablet (10 mg total) by mouth at bedtime., Disp: 90 tablet, Rfl: 1   Multiple Vitamin (MULTIVITAMIN) tablet, Take 1 tablet by mouth daily., Disp: , Rfl:    ondansetron (ZOFRAN) 4 MG tablet, Take 1 tablet (4 mg total) by mouth every 8 (eight) hours as needed for nausea or vomiting., Disp: 20 tablet, Rfl: 0   polyethylene glycol powder (GLYCOLAX/MIRALAX) 17 GM/SCOOP powder, Take 17 g by mouth 2 (two) times daily as needed for moderate constipation., Disp: 255 g, Rfl: 0   Sod Fluoride-Potassium Nitrate (PREVIDENT 5000 SENSITIVE) 1.1-5 % PSTE, Prevident 5000 Enamel Protect 1.1 %-5 % dental paste  BRUSH TEETH WITH PASTE FOR AT  LEAST ONE MINUTE 2 TO 3 TIMES DAILY, Disp: , Rfl:    levocetirizine (XYZAL) 5 MG tablet, Take 1 tablet (5 mg total) by mouth every evening. (Patient not taking: Reported on 09/02/2021), Disp: 30 tablet, Rfl: 11  Allergies  Allergen Reactions   Ortho Tri-Cyclen [Norgestimate-Eth Estradiol] Other (See Comments)    Blood clot and PE   Prednisone Anaphylaxis   Augmentin [Amoxicillin-Pot Clavulanate] Other (See Comments)    Upsets her stomach    I personally reviewed active problem list, medication list, allergies with the patient/caregiver today.  ROS  Constitutional: Positive for fever, negative for weight  change.  Respiratory: Positive for cough, negative for shortness of breath.   Cardiovascular: Negative for chest pain or palpitations.  Gastrointestinal: Negative for abdominal pain, no bowel changes.  Musculoskeletal: Negative for gait problem or joint swelling.  Skin: Negative for rash.  Neurological: Negative for dizziness or headache.  No other specific complaints in a complete review of systems (except as listed in HPI above).   Objective  Virtual encounter, vitals not obtained.  There is no height or weight on file to calculate BMI.  Nursing Note and Vital Signs reviewed.  Physical Exam  Awake, alert and oriented, speaking in complete sentences.  No results found for this or any previous visit (from the past 72 hour(s)).  Assessment & Plan  1. Viral upper respiratory tract infection -OTC treatments -push fluids - POCT Influenza A/B  -if flu positive will send in Tamiflu - Novel Coronavirus, NAA ([facility])   -Red flags and when to present for emergency care or RTC including fever >101.61F, chest pain, shortness of breath, new/worsening/un-resolving symptoms,  reviewed with patient at time of visit. Follow up and care instructions discussed and provided in AVS. - I discussed the assessment and treatment plan with the patient. The patient was provided an opportunity to ask questions and all were answered. The patient agreed with the plan and demonstrated an understanding of the instructions.  I provided 15 minutes of non-face-to-face time during this encounter.  Bo Merino, FNP

## 2021-09-03 LAB — SARS-COV-2, NAA 2 DAY TAT

## 2021-09-03 LAB — NOVEL CORONAVIRUS, NAA: SARS-CoV-2, NAA: NOT DETECTED

## 2021-09-09 ENCOUNTER — Other Ambulatory Visit: Payer: Self-pay | Admitting: Nurse Practitioner

## 2021-09-09 ENCOUNTER — Encounter: Payer: Self-pay | Admitting: Nurse Practitioner

## 2021-09-09 DIAGNOSIS — J0140 Acute pansinusitis, unspecified: Secondary | ICD-10-CM

## 2021-09-09 MED ORDER — DOXYCYCLINE HYCLATE 100 MG PO TABS: 100.0000 mg | ORAL_TABLET | Freq: Two times a day (BID) | ORAL | 0 refills | Status: DC

## 2021-09-13 ENCOUNTER — Other Ambulatory Visit: Payer: Self-pay | Admitting: Nurse Practitioner

## 2021-09-13 ENCOUNTER — Encounter: Payer: Self-pay | Admitting: Nurse Practitioner

## 2021-09-13 DIAGNOSIS — J0140 Acute pansinusitis, unspecified: Secondary | ICD-10-CM

## 2021-09-13 MED ORDER — AZITHROMYCIN 250 MG PO TABS: ORAL_TABLET | ORAL | 0 refills | Status: DC

## 2021-09-14 ENCOUNTER — Ambulatory Visit: Payer: Self-pay

## 2021-09-14 NOTE — Telephone Encounter (Signed)
Pt called in stating that she's been on Doxycycline 100mg  BID and last dose taken was Sunday but she's been having diarrhea, nausea, and stomach cramping. She states that another abx was called into pharmacy but she's been unable to go to pick up d/t the diarrhea. She says previously when she had these symptoms provider sent her to ER for evaluation of appendicitis and now she's worried. Scheduled pt appt tomorrow at 1300 with Almyra Free, NP. Pt asked if I felt liek she needed to go to ER, I advised that I feel like she should be able to stay home until her appt but if symptoms worsened she should go to ER. Care advice given and pt verbalized understanding. No other questions/concerns noted.     Reason for Disposition  MODERATE diarrhea (e.g., 4-6 times / day more than normal)  Answer Assessment - Initial Assessment Questions 1. ANTIBIOTIC: "What antibiotic are you taking?" "How many times per day?"     Doxycycline 100mg  BID 2. ANTIBIOTIC ONSET: "When was the antibiotic started?"     Sunday  3. DIARRHEA SEVERITY: "How bad is the diarrhea?" "How many more stools have you had in the past 24 hours than normal?"    - NO DIARRHEA (SCALE 0)   - MILD (SCALE 1-3): Few loose or mushy BMs; increase of 1-3 stools over normal daily number of stools; mild increase in ostomy output.   -  MODERATE (SCALE 4-7): Increase of 4-6 stools daily over normal; moderate increase in ostomy output. * SEVERE (SCALE 8-10; OR 'WORST POSSIBLE'): Increase of 7 or more stools daily over normal; moderate increase in ostomy output; incontinence.     1-2, 8-10 times yesterday 4. ONSET: "When did the diarrhea begin?"      Sunday evening 5. BM CONSISTENCY: "How loose or watery is the diarrhea?"      Very loose  6. VOMITING: "Are you also vomiting?" If Yes, ask: "How many times in the past 24 hours?"      No but nausea 7. ABDOMINAL PAIN: "Are you having any abdominal pain?" If Yes, ask: "What does it feel like?" (e.g., crampy, dull,  intermittent, constant)      Crampy, rumbling  8. ABDOMINAL PAIN SEVERITY: If present, ask: "How bad is the pain?"  (e.g., Scale 1-10; mild, moderate, or severe)   - MILD (1-3): doesn't interfere with normal activities, abdomen soft and not tender to touch    - MODERATE (4-7): interferes with normal activities or awakens from sleep, abdomen tender to touch    - SEVERE (8-10): excruciating pain, doubled over, unable to do any normal activities       6-7 9. ORAL INTAKE: If vomiting, "Have you been able to drink liquids?" "How much liquids have you had in the past 24 hours?"     Water  10. HYDRATION: "Any signs of dehydration?" (e.g., dry mouth [not just dry lips], too weak to stand, dizziness, new weight loss) "When did you last urinate?"       Yes, few hrs ago 11. EXPOSURE: "Have you traveled to a foreign country recently?" "Have you been exposed to anyone with diarrhea?" "Could you have eaten any food that was spoiled?"       No 12. OTHER SYMPTOMS: "Do you have any other symptoms?" (e.g., fever, blood in stool)       No, was sick 2 weeks ago, neg for COVID and flu 13. PREGNANCY: "Is there any chance you are pregnant?" "When was your last menstrual period?"  No  Protocols used: Diarrhea on Antibiotics-A-[facility]

## 2021-09-15 ENCOUNTER — Encounter: Payer: Self-pay | Admitting: Nurse Practitioner

## 2021-09-15 ENCOUNTER — Telehealth (INDEPENDENT_AMBULATORY_CARE_PROVIDER_SITE_OTHER): Payer: Self-pay | Admitting: Nurse Practitioner

## 2021-09-15 VITALS — Ht >= 80 in | Wt 161.0 lb

## 2021-09-15 DIAGNOSIS — T3695XA Adverse effect of unspecified systemic antibiotic, initial encounter: Secondary | ICD-10-CM

## 2021-09-15 DIAGNOSIS — K521 Toxic gastroenteritis and colitis: Secondary | ICD-10-CM

## 2021-09-15 NOTE — Progress Notes (Signed)
Name: Ana Knapp   MRN: [id number]    DOB: [birth year]   Date:09/15/2021       Progress Note  Subjective  Chief Complaint  Chief Complaint  Patient presents with   Diarrhea    Side effect from antibiotic    I connected with  [name]  on 09/15/21 at 12:50 pm by a video enabled telemedicine application and verified that I am speaking with the correct person using two identifiers.  I discussed the limitations of evaluation and management by telemedicine and the availability of in person appointments. The patient expressed understanding and agreed to proceed with a virtual visit  Staff also discussed with the patient that there may be a patient responsible charge related to this service. Patient Location: home Provider Location: [facility] Additional Individuals present: alone  HPI  Diarrhea: She says she was taking Doxycycline for a sinus infection.  She said on Monday she had about 8-10 episodes of diarrhea, on Tuesday she had about 2-4 episodes of diarrhea.  She denies any fever or chills. She says that her abdomen is a little tender but not painful.  She says she is feeling better today and has not had any episodes of diarrhea.  Discussed that the diarrhea was likely induced by the antibiotic. Encouraged her to take a probiotic.  Discussed getting a stool sample to test for c-diff.  She said that she would do that if the diarrhea returns.    Patient Active Problem List   Diagnosis Date Noted   Endometriosis 03/01/2018   Abnormal CT scan    History of melanoma 11/22/2017   Biliary dyskinesia 12/05/2016   Constipation 08/18/2016   May-Thurner syndrome 11/30/2015   DVT (deep venous thrombosis) ([place]) 11/13/2015   History of pulmonary embolism 11/13/2015   GERD (gastroesophageal reflux disease) 09/28/2015   Flushing 09/24/2015   Fibromyalgia 08/30/2015   Seasonal allergic rhinitis 08/30/2015   Fibrositis 08/30/2015    Social History   Tobacco Use   Smoking status:  Never   Smokeless tobacco: Never  Substance Use Topics   Alcohol use: Not Currently    Comment: rare     Current Outpatient Medications:    acetaminophen (TYLENOL) 325 MG tablet, Take 650 mg by mouth every 6 (six) hours as needed., Disp: , Rfl:    aspirin 81 MG EC tablet, Take 1 tablet by mouth daily., Disp: , Rfl:    azithromycin (ZITHROMAX) 250 MG tablet, Take 2 tablets on day 1, then 1 tablet daily on days 2 through 5, Disp: 6 tablet, Rfl: 0   diclofenac Sodium (VOLTAREN) 1 % GEL, Apply 4 g topically 4 (four) times daily., Disp: 200 g, Rfl: 1   esomeprazole (NEXIUM) 40 MG capsule, Take 1 capsule (40 mg total) by mouth 2 (two) times daily before a meal., Disp: 60 capsule, Rfl: 6   fluticasone (FLONASE) 50 MCG/ACT nasal spray, Place 2 sprays into both nostrils daily., Disp: 16 g, Rfl: 2   levocetirizine (XYZAL) 5 MG tablet, Take 1 tablet (5 mg total) by mouth every evening., Disp: 30 tablet, Rfl: 11   montelukast (SINGULAIR) 10 MG tablet, Take 1 tablet (10 mg total) by mouth at bedtime., Disp: 90 tablet, Rfl: 1   Multiple Vitamin (MULTIVITAMIN) tablet, Take 1 tablet by mouth daily., Disp: , Rfl:    ondansetron (ZOFRAN) 4 MG tablet, Take 1 tablet (4 mg total) by mouth every 8 (eight) hours as needed for nausea or vomiting., Disp: 20 tablet, Rfl: 0  polyethylene glycol powder (GLYCOLAX/MIRALAX) 17 GM/SCOOP powder, Take 17 g by mouth 2 (two) times daily as needed for moderate constipation., Disp: 255 g, Rfl: 0   Sod Fluoride-Potassium Nitrate (PREVIDENT 5000 SENSITIVE) 1.1-5 % PSTE, Prevident 5000 Enamel Protect 1.1 %-5 % dental paste  BRUSH TEETH WITH PASTE FOR AT LEAST ONE MINUTE 2 TO 3 TIMES DAILY, Disp: , Rfl:   Allergies  Allergen Reactions   Ortho Tri-Cyclen [Norgestimate-Eth Estradiol] Other (See Comments)    Blood clot and PE   Prednisone Anaphylaxis   Augmentin [Amoxicillin-Pot Clavulanate] Other (See Comments)    Upsets her stomach   Doxycycline     Nausea, diarrhea    I  personally reviewed active problem list, medication list, allergies with the patient/caregiver today.  ROS  Constitutional: Negative for fever or weight change.  Respiratory: Negative for cough and shortness of breath.   Cardiovascular: Negative for chest pain or palpitations.  Gastrointestinal: Negative for abdominal pain,  diarrhea.  Musculoskeletal: Negative for gait problem or joint swelling.  Skin: Negative for rash.  Neurological: Negative for dizziness or headache.  No other specific complaints in a complete review of systems (except as listed in HPI above).   Objective  Virtual encounter, vitals not obtained.  Body mass index is 17.25 kg/m.  Nursing Note and Vital Signs reviewed.  Physical Exam  Awake, alert and oriented  No results found for this or any previous visit (from the past 62 hour(s)).  Assessment & Plan  1. Antibiotic-associated diarrhea - use probiotics -push fluids -BRAT diet  - if diarrhea returns come by for a cup to collect a stool sample  -Red flags and when to present for emergency care or RTC including fever >101.24F, chest pain, shortness of breath, new/worsening/un-resolving symptoms, reviewed with patient at time of visit. Follow up and care instructions discussed and provided in AVS. - I discussed the assessment and treatment plan with the patient. The patient was provided an opportunity to ask questions and all were answered. The patient agreed with the plan and demonstrated an understanding of the instructions.  I provided 15 minutes of non-face-to-face time during this encounter.  Bo Merino, FNP

## 2021-11-15 ENCOUNTER — Encounter: Payer: Self-pay | Admitting: Nurse Practitioner

## 2021-12-15 ENCOUNTER — Ambulatory Visit
Admission: RE | Admit: 2021-12-15 | Discharge: 2021-12-15 | Disposition: A | Discharge status: Home / Self Care | Payer: 59 | Source: Ambulatory Visit | Attending: Family Medicine | Admitting: Family Medicine

## 2021-12-15 ENCOUNTER — Other Ambulatory Visit: Payer: Self-pay

## 2021-12-15 DIAGNOSIS — Z1231 Encounter for screening mammogram for malignant neoplasm of breast: Secondary | ICD-10-CM | POA: Insufficient documentation

## 2022-01-04 ENCOUNTER — Telehealth: Payer: 59 | Admitting: Nurse Practitioner

## 2022-01-04 DIAGNOSIS — J0100 Acute maxillary sinusitis, unspecified: Secondary | ICD-10-CM

## 2022-01-04 MED ORDER — AZITHROMYCIN 250 MG PO TABS: ORAL_TABLET | ORAL | 0 refills | Status: DC

## 2022-01-04 NOTE — Progress Notes (Signed)
? ?Virtual Visit Consent  ? ?Ana Knapp, you are scheduled for a virtual visit with [name], [place], a [facility] provider, today.   ?  ?Just as with appointments in the office, your consent must be obtained to participate.  Your consent will be active for this visit and any virtual visit you may have with one of our providers in the next 365 days.   ?  ?If you have a MyChart account, a copy of this consent can be sent to you electronically.  All virtual visits are billed to your insurance company just like a traditional visit in the office.   ? ?As this is a virtual visit, video technology does not allow for your provider to perform a traditional examination.  This may limit your provider's ability to fully assess your condition.  If your provider identifies any concerns that need to be evaluated in person or the need to arrange testing (such as labs, EKG, etc.), we will make arrangements to do so.   ?  ?Although advances in technology are sophisticated, we cannot ensure that it will always work on either your end or our end.  If the connection with a video visit is poor, the visit may have to be switched to a telephone visit.  With either a video or telephone visit, we are not always able to ensure that we have a secure connection.    ? ?I need to obtain your verbal consent now.   Are you willing to proceed with your visit today? YES ?  ?Ana Knapp has provided verbal consent on 01/04/2022 for a virtual visit (video or telephone). ?  ?[name], FNP  ? ?Date: 01/04/2022 7:23 PM ? ? ?Virtual Visit via Video Note  ? ?I, [name], connected with Ana Knapp ([id number], 02-22-1981) on 01/04/22 at  7:30 PM EDT by a video-enabled telemedicine application and verified that I am speaking with the correct person using two identifiers. ? ?Location: ?Patient: Virtual Visit Location Patient: Home ?Provider: Virtual Visit Location Provider: Mobile ?  ?I discussed the  limitations of evaluation and management by telemedicine and the availability of in person appointments. The patient expressed understanding and agreed to proceed.   ? ?History of Present Illness: ?Ana Knapp is a 41 y.o. who identifies as a female who was assigned female at birth, and is being seen today for uri. ?. ? ?HPI: URI  ?This is a recurrent problem. The current episode started 1 to 4 weeks ago. The problem has been gradually worsening. There has been no fever. Associated symptoms include congestion, coughing, headaches (intermittent), rhinorrhea and sinus pain. Pertinent negatives include no sore throat. She has tried antihistamine and decongestant for the symptoms. The treatment provided mild relief.   ?Review of Systems  ?HENT:  Positive for congestion, rhinorrhea and sinus pain. Negative for sore throat.   ?Respiratory:  Positive for cough.   ?Neurological:  Positive for headaches (intermittent).  ? ?Problems:  ?Patient Active Problem List  ? Diagnosis Date Noted  ? Endometriosis 03/01/2018  ? Abnormal CT scan   ? History of melanoma 11/22/2017  ? Biliary dyskinesia 12/05/2016  ? Constipation 08/18/2016  ? May-Thurner syndrome 11/30/2015  ? DVT (deep venous thrombosis) ([place]) 11/13/2015  ? History of pulmonary embolism 11/13/2015  ? GERD (gastroesophageal reflux disease) 09/28/2015  ? Flushing 09/24/2015  ? Fibromyalgia 08/30/2015  ? Seasonal allergic rhinitis 08/30/2015  ? Fibrositis 08/30/2015  ?  ?Allergies:  ?Allergies  ?Allergen Reactions  ?  Ortho Tri-Cyclen [Norgestimate-Eth Estradiol] Other (See Comments)  ?  Blood clot and PE  ? Prednisone Anaphylaxis  ? Augmentin [Amoxicillin-Pot Clavulanate] Other (See Comments)  ?  Upsets her stomach  ? Doxycycline   ?  Nausea, diarrhea  ? ?Medications:  ?Current Outpatient Medications:  ?  acetaminophen (TYLENOL) 325 MG tablet, Take 650 mg by mouth every 6 (six) hours as needed., Disp: , Rfl:  ?  aspirin 81 MG EC tablet, Take 1 tablet by mouth  daily., Disp: , Rfl:  ?  diclofenac Sodium (VOLTAREN) 1 % GEL, Apply 4 g topically 4 (four) times daily., Disp: 200 g, Rfl: 1 ?  esomeprazole (NEXIUM) 40 MG capsule, Take 1 capsule (40 mg total) by mouth 2 (two) times daily before a meal., Disp: 60 capsule, Rfl: 6 ?  fluticasone (FLONASE) 50 MCG/ACT nasal spray, Place 2 sprays into both nostrils daily., Disp: 16 g, Rfl: 2 ?  levocetirizine (XYZAL) 5 MG tablet, Take 1 tablet (5 mg total) by mouth every evening., Disp: 30 tablet, Rfl: 11 ?  montelukast (SINGULAIR) 10 MG tablet, Take 1 tablet (10 mg total) by mouth at bedtime., Disp: 90 tablet, Rfl: 1 ?  Multiple Vitamin (MULTIVITAMIN) tablet, Take 1 tablet by mouth daily., Disp: , Rfl:  ?  ondansetron (ZOFRAN) 4 MG tablet, Take 1 tablet (4 mg total) by mouth every 8 (eight) hours as needed for nausea or vomiting., Disp: 20 tablet, Rfl: 0 ?  polyethylene glycol powder (GLYCOLAX/MIRALAX) 17 GM/SCOOP powder, Take 17 g by mouth 2 (two) times daily as needed for moderate constipation., Disp: 255 g, Rfl: 0 ?  Sod Fluoride-Potassium Nitrate (PREVIDENT 5000 SENSITIVE) 1.1-5 % PSTE, Prevident 5000 Enamel Protect 1.1 %-5 % dental paste  BRUSH TEETH WITH PASTE FOR AT LEAST ONE MINUTE 2 TO 3 TIMES DAILY, Disp: , Rfl:  ? ?Observations/Objective: ?Patient is well-developed, well-nourished in no acute distress.  ?Resting comfortably  at home.  ?Head is normocephalic, atraumatic.  ?No labored breathing.  ?Speech is clear and coherent with logical content.  ?Patient is alert and oriented at baseline.  ?Maxillary facial pressure ? ?Assessment and Plan: ? ?Ana Knapp in today with chief complaint of URI ? ? ?1. Acute non-recurrent maxillary sinusitis ?1. Take meds as prescribed ?2. Use a cool mist humidifier especially during the winter months and when heat has been humid. ?3. Use saline nose sprays frequently ?4. Saline irrigations of the nose can be very helpful if done frequently. ? * 4X daily for 1 week* ? * Use of a nettie  pot can be helpful with this. Follow directions with this* ?5. Drink plenty of fluids ?6. Keep thermostat turn down low ?7.For any cough or congestion- dayquil and nyquil ?8. For fever or aces or pains- take tylenol or ibuprofen appropriate for age and weight. ? * for fevers greater than 101 orally you may alternate ibuprofen and tylenol every  3 hours. ?  ? ?Meds ordered this encounter  ?Medications  ? azithromycin (ZITHROMAX Z-PAK) 250 MG tablet  ?  Sig: As directed  ?  Dispense:  6 tablet  ?  Refill:  0  ?  Order Specific Question:   Supervising Provider  ?  Answer:   Noemi Chapel [[id number]]  ? ? ? ? ? ? ? ?Follow Up Instructions: ?I discussed the assessment and treatment plan with the patient. The patient was provided an opportunity to ask questions and all were answered. The patient agreed with the plan and demonstrated an understanding of  the instructions.  A copy of instructions were sent to the patient via MyChart. ? ?The patient was advised to call back or seek an in-person evaluation if the symptoms worsen or if the condition fails to improve as anticipated. ? ?Time:  ?I spent 8 minutes with the patient via telehealth technology discussing the above problems/concerns.   ? ?[name], FNP ? ?

## 2022-01-04 NOTE — Patient Instructions (Signed)
1. Take meds as prescribed ?2. Use a cool mist humidifier especially during the winter months and when heat has been humid. ?3. Use saline nose sprays frequently ?4. Saline irrigations of the nose can be very helpful if done frequently. ? * 4X daily for 1 week* ? * Use of a nettie pot can be helpful with this. Follow directions with this* ?5. Drink plenty of fluids ?6. Keep thermostat turn down low ?7.For any cough or congestion- dayqil and nyquil ?8. For fever or aces or pains- take tylenol or ibuprofen appropriate for age and weight. ? * for fevers greater than 101 orally you may alternate ibuprofen and tylenol every  3 hours. ?  ? ?

## 2022-01-10 ENCOUNTER — Other Ambulatory Visit: Payer: Self-pay | Admitting: Family Medicine

## 2022-01-10 DIAGNOSIS — D259 Leiomyoma of uterus, unspecified: Secondary | ICD-10-CM

## 2022-01-10 DIAGNOSIS — N809 Endometriosis, unspecified: Secondary | ICD-10-CM

## 2022-01-10 DIAGNOSIS — N945 Secondary dysmenorrhea: Secondary | ICD-10-CM

## 2022-01-11 NOTE — Telephone Encounter (Signed)
Pt has scheduled appt for 4.10.2023 (her next day off) with Ana Knapp. Are you able to give her enough to last until then?

## 2022-01-11 NOTE — Telephone Encounter (Signed)
Lvm informing that prescription has been sent to pharmacy

## 2022-01-31 ENCOUNTER — Other Ambulatory Visit: Payer: Self-pay

## 2022-01-31 ENCOUNTER — Ambulatory Visit (INDEPENDENT_AMBULATORY_CARE_PROVIDER_SITE_OTHER): Payer: 59 | Admitting: Nurse Practitioner

## 2022-01-31 ENCOUNTER — Encounter: Payer: Self-pay | Admitting: Nurse Practitioner

## 2022-01-31 VITALS — BP 124/72 | HR 98 | Temp 98.0°F | Resp 14 | Wt 162.6 lb

## 2022-01-31 DIAGNOSIS — M25561 Pain in right knee: Secondary | ICD-10-CM

## 2022-01-31 DIAGNOSIS — J3089 Other allergic rhinitis: Secondary | ICD-10-CM

## 2022-01-31 DIAGNOSIS — J302 Other seasonal allergic rhinitis: Secondary | ICD-10-CM

## 2022-01-31 DIAGNOSIS — R5383 Other fatigue: Secondary | ICD-10-CM | POA: Diagnosis not present

## 2022-01-31 DIAGNOSIS — N945 Secondary dysmenorrhea: Secondary | ICD-10-CM

## 2022-01-31 DIAGNOSIS — Z13220 Encounter for screening for lipoid disorders: Secondary | ICD-10-CM

## 2022-01-31 DIAGNOSIS — G8929 Other chronic pain: Secondary | ICD-10-CM

## 2022-01-31 DIAGNOSIS — Z131 Encounter for screening for diabetes mellitus: Secondary | ICD-10-CM

## 2022-01-31 MED ORDER — DICLOFENAC SODIUM 1 % EX GEL: 4.0000 g | Freq: Four times a day (QID) | CUTANEOUS | 1 refills | Status: DC

## 2022-01-31 MED ORDER — MONTELUKAST SODIUM 10 MG PO TABS: 10.0000 mg | ORAL_TABLET | Freq: Every day | ORAL | 1 refills | Status: DC

## 2022-01-31 MED ORDER — LEVOCETIRIZINE DIHYDROCHLORIDE 5 MG PO TABS: 5.0000 mg | ORAL_TABLET | Freq: Every evening | ORAL | 11 refills | Status: AC

## 2022-01-31 NOTE — Progress Notes (Signed)
? ?BP 124/72   Pulse 98   Temp 98 ?F (36.7 ?C) (Oral)   Resp 14   Wt 162 lb 9.6 oz (73.8 kg)   SpO2 100%   BMI 17.42 kg/m?   ? ?Subjective:  ? ? Patient ID: Ana Knapp, female    DOB: 08-14-1981, 41 y.o.   MRN: [id number] ? ?HPI: ?Ana Knapp is a 41 y.o. female ? ?Chief Complaint  ?Patient presents with  ? Medication Refill  ? Consult  ?  Discuss lab work for thyroid and diabetes  ? Menstrual Problem  ? ?Fatigue: She says she has been having some fatigue for a few months.  She says she sleeps well at night.  She says she tries to avoid caffeine later in the day.  She gets about 6-6.5 hours.  She says that she has been losing some hair.  She does report that she is having some night sweats.  She says all these symptoms have been happening over the last few months. Will get labs ? ?Dysmenorrhea/ fluctuations in cycle: She says that her last few periods have been shorter only lasting four days and normally they last a week to a week in half.  She says that she has had increase in pain.  She says she had an ultrasound done last year and it showed fibroids. She sees Dr. Marcelline Mates at [facility]. She has an appointment scheduled in June, discussed reaching out and seeing if they can see her sooner.  [facility]: she says she is finishing her cycle now.   ? ?Right knee pain: She says she uses Voltaren gel for her chronic knee pain. She denies any new injury. Will send in refill.  ? ?Allergies: She says she has seasonal allergies and that the medications help. She says she is currently taking Flonase the pill form over the counter, xyzal and Singulair.  She denies any cough or fever.  She says she just gets really bad allergies during the spring.  Refills sent.   ? ?Relevant past medical, surgical, family and social history reviewed and updated as indicated. Interim medical history since our last visit reviewed. ?Allergies and medications reviewed and updated. ? ?Review of Systems ? ?Constitutional:  Negative for fever or weight change.  ?Respiratory: Negative for cough and shortness of breath.   ?Cardiovascular: Negative for chest pain or palpitations.  ?Gastrointestinal: Negative for abdominal pain, no bowel changes.  ?Musculoskeletal: Negative for gait problem or joint swelling.  ?Skin: Negative for rash.  ?Neurological: Negative for dizziness or headache.  ?No other specific complaints in a complete review of systems (except as listed in HPI above).  ? ?   ?Objective:  ?  ?BP 124/72   Pulse 98   Temp 98 ?F (36.7 ?C) (Oral)   Resp 14   Wt 162 lb 9.6 oz (73.8 kg)   SpO2 100%   BMI 17.42 kg/m?   ?Wt Readings from Last 3 Encounters:  ?01/31/22 162 lb 9.6 oz (73.8 kg)  ?09/15/21 161 lb (73 kg)  ?07/14/21 161 lb (73 kg)  ?  ?Physical Exam ? ?Constitutional: Patient appears well-developed and well-nourished.  No distress.  ?HEENT: head atraumatic, normocephalic, pupils equal and reactive to light, neck supple ?Cardiovascular: Normal rate, regular rhythm and normal heart sounds.  No murmur heard. No BLE edema. ?Pulmonary/Chest: Effort normal and breath sounds normal. No respiratory distress. ?Abdominal: Soft.  There is no tenderness. ?Psychiatric: Patient has a normal mood and affect. behavior is normal. Judgment  and thought content normal.  ?Results for orders placed or performed in visit on 09/02/21  ?Novel Coronavirus, NAA ([facility])  ? Specimen: Nasopharyngeal(NP) swabs in vial transport medium  ? Nasopharynge  Previous  ?Result Value Ref Range  ? SARS-CoV-2, NAA Not Detected Not Detected  ?SARS-COV-2, NAA 2 DAY TAT  ? Nasopharynge  Previous  ?Result Value Ref Range  ? SARS-CoV-2, NAA 2 DAY TAT Performed   ?POCT Influenza A/B  ?Result Value Ref Range  ? Influenza A, POC Negative Negative  ? Influenza B, POC Negative Negative  ? ?   ?Assessment & Plan:  ? ?1. Fatigue, unspecified type ? ?- CBC with Differential/Platelet ?- COMPLETE METABOLIC PANEL WITH GFR ?- TSH ?- VITAMIN D 25 Hydroxy (Vit-D Deficiency,  Fractures) ?- Vitamin B12 ? ?2. Chronic pain of right knee ? ?- diclofenac Sodium (VOLTAREN) 1 % GEL; Apply 4 g topically 4 (four) times daily.  Dispense: 200 g; Refill: 1 ? ?3. Perennial allergic rhinitis with seasonal variation ? ?- levocetirizine (XYZAL) 5 MG tablet; Take 1 tablet (5 mg total) by mouth every evening.  Dispense: 30 tablet; Refill: 11 ?- montelukast (SINGULAIR) 10 MG tablet; Take 1 tablet (10 mg total) by mouth at bedtime.  Dispense: 90 tablet; Refill: 1 ? ?4. Screening for diabetes mellitus ? ?- Hemoglobin A1c ? ?5. Screening for cholesterol level ? ?- Lipid panel ? ?6. Secondary dysmenorrhea ?-follow up with GYN ? ?Follow up plan: ?Return in about 6 months (around 08/02/2022) for follow up. ? ? ? ? ? ?

## 2022-02-01 LAB — CBC WITH DIFFERENTIAL/PLATELET
Absolute Monocytes: 519 cells/uL (ref 200–950)
Basophils Absolute: 51 cells/uL (ref 0–200)
Basophils Relative: 0.6 %
Eosinophils Absolute: 374 cells/uL (ref 15–500)
Eosinophils Relative: 4.4 %
HCT: 39.4 % (ref 35.0–45.0)
Hemoglobin: 13 g/dL (ref 11.7–15.5)
Lymphs Abs: 1224 cells/uL (ref 850–3900)
MCH: 29 pg (ref 27.0–33.0)
MCHC: 33 g/dL (ref 32.0–36.0)
MCV: 87.8 fL (ref 80.0–100.0)
MPV: 9.9 fL (ref 7.5–12.5)
Monocytes Relative: 6.1 %
Neutro Abs: 6333 cells/uL (ref 1500–7800)
Neutrophils Relative %: 74.5 %
Platelets: 216 10*3/uL (ref 140–400)
RBC: 4.49 10*6/uL (ref 3.80–5.10)
RDW: 11.9 % (ref 11.0–15.0)
Total Lymphocyte: 14.4 %
WBC: 8.5 10*3/uL (ref 3.8–10.8)

## 2022-02-01 LAB — HEMOGLOBIN A1C
Hgb A1c MFr Bld: 5.4 % of total Hgb (ref <5.7)
Mean Plasma Glucose: 108 mg/dL
eAG (mmol/L): 6 mmol/L

## 2022-02-01 LAB — TSH: TSH: 1.23 mIU/L

## 2022-02-01 LAB — COMPLETE METABOLIC PANEL WITH GFR
AG Ratio: 1.6 (calc) (ref 1.0–2.5)
ALT: 11 U/L (ref 6–29)
AST: 16 U/L (ref 10–30)
Albumin: 4.6 g/dL (ref 3.6–5.1)
Alkaline phosphatase (APISO): 33 U/L (ref 31–125)
BUN: 14 mg/dL (ref 7–25)
CO2: 27 mmol/L (ref 20–32)
Calcium: 9.6 mg/dL (ref 8.6–10.2)
Chloride: 105 mmol/L (ref 98–110)
Creat: 0.79 mg/dL (ref 0.50–0.99)
Globulin: 2.9 g/dL (calc) (ref 1.9–3.7)
Glucose, Bld: 74 mg/dL (ref 65–99)
Potassium: 4.2 mmol/L (ref 3.5–5.3)
Sodium: 140 mmol/L (ref 135–146)
Total Bilirubin: 0.5 mg/dL (ref 0.2–1.2)
Total Protein: 7.5 g/dL (ref 6.1–8.1)
eGFR: 95 mL/min/{1.73_m2} (ref >=60)

## 2022-02-01 LAB — LIPID PANEL
Cholesterol: 166 mg/dL (ref <200)
HDL: 72 mg/dL (ref >=50)
LDL Cholesterol (Calc): 70 mg/dL (calc)
Non-HDL Cholesterol (Calc): 94 mg/dL (calc) (ref <130)
Total CHOL/HDL Ratio: 2.3 (calc) (ref <5.0)
Triglycerides: 166 mg/dL — ABNORMAL HIGH (ref <150)

## 2022-02-01 LAB — VITAMIN D 25 HYDROXY (VIT D DEFICIENCY, FRACTURES): Vit D, 25-Hydroxy: 21 ng/mL — ABNORMAL LOW (ref 30–100)

## 2022-02-01 LAB — VITAMIN B12: Vitamin B-12: 364 pg/mL (ref 200–1100)

## 2022-02-20 ENCOUNTER — Other Ambulatory Visit: Payer: Self-pay | Admitting: Family Medicine

## 2022-02-20 DIAGNOSIS — J302 Other seasonal allergic rhinitis: Secondary | ICD-10-CM

## 2022-02-28 ENCOUNTER — Telehealth: Payer: Self-pay | Admitting: Gastroenterology

## 2022-02-28 NOTE — Telephone Encounter (Signed)
Pt left message requesting refill on her esomeprazole   pharmacy [facility] at [facility] ?

## 2022-03-01 MED ORDER — ESOMEPRAZOLE MAGNESIUM 40 MG PO CPDR: 40.0000 mg | DELAYED_RELEASE_CAPSULE | Freq: Two times a day (BID) | ORAL | 1 refills | Status: DC

## 2022-03-01 NOTE — Telephone Encounter (Signed)
Pt scheduled for video visit to f/u... Rx sent through e-scribe ? ?

## 2022-03-01 NOTE — Telephone Encounter (Signed)
Patient states she called 2 times yesterday about getting a refill on her medications. She states she needs to talk to someone ASAP because she only has one pill left of her medication  ?

## 2022-03-01 NOTE — Addendum Note (Signed)
Addended by: Lurlean Nanny on: 03/01/2022 09:31 AM ? ? Modules accepted: Orders ? ?

## 2022-04-05 ENCOUNTER — Telehealth: Payer: Self-pay | Admitting: Gastroenterology

## 2022-04-05 NOTE — Telephone Encounter (Signed)
Patient calling requesting a mychart video visit and is wondering if she can move it to this week.

## 2022-04-13 ENCOUNTER — Telehealth: Payer: 59 | Admitting: Gastroenterology

## 2022-04-18 ENCOUNTER — Encounter: Payer: 59 | Admitting: Certified Nurse Midwife

## 2022-04-28 ENCOUNTER — Other Ambulatory Visit: Payer: Self-pay

## 2022-04-28 MED ORDER — ESOMEPRAZOLE MAGNESIUM 40 MG PO CPDR: 40.0000 mg | DELAYED_RELEASE_CAPSULE | Freq: Two times a day (BID) | ORAL | 1 refills | Status: DC

## 2022-05-10 ENCOUNTER — Other Ambulatory Visit (HOSPITAL_COMMUNITY)
Admission: RE | Admit: 2022-05-10 | Discharge: 2022-05-10 | Disposition: A | Discharge status: Home / Self Care | Payer: 59 | Source: Ambulatory Visit | Attending: Certified Nurse Midwife | Admitting: Certified Nurse Midwife

## 2022-05-10 ENCOUNTER — Ambulatory Visit (INDEPENDENT_AMBULATORY_CARE_PROVIDER_SITE_OTHER): Payer: 59 | Admitting: Certified Nurse Midwife

## 2022-05-10 ENCOUNTER — Encounter: Payer: Self-pay | Admitting: Certified Nurse Midwife

## 2022-05-10 VITALS — BP 114/69 | HR 82 | Ht 69.0 in | Wt 165.4 lb

## 2022-05-10 DIAGNOSIS — R102 Pelvic and perineal pain: Secondary | ICD-10-CM

## 2022-05-10 NOTE — Progress Notes (Signed)
GYN ENCOUNTER NOTE  Subjective:       Ana Knapp is a 41 y.o. G0P0000 female is here for gynecologic evaluation of the following issues:  1. Pelvic pain. Pt was seen last year and noted to have small fibroid, pain has continued and progress over the past several months. She states she experiences the pain almost daily, it occurs approximately 4 times a day/night, and it is worse at night. It is also worse with her cycle. She has not be able to be intimate due to the pain. It is 4 /10 at it least and 8/10 at its worst. She states she has a history of endometriosis.    Gynecologic History No LMP recorded. Contraception: none Last Pap: 01/18/2018. Results were: normal, negative HPV Last mammogram: 12/15/21. Results were: normal  Obstetric History OB History  Gravida Para Term Preterm AB Living  0 0 0 0 0 0  SAB IAB Ectopic Multiple Live Births  0 0 0 0 0    Past Medical History:  Diagnosis Date   Chronic sinusitis    Fibromyalgia    GERD (gastroesophageal reflux disease)    Headache    sinus   History of melanoma 11/22/2017   History of melanoma 2018   Back   May-Thurner syndrome 11/30/2015   January 2017; 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[facility]    PONV (postoperative nausea and vomiting)    Pulmonary embolism [facility])     Past Surgical History:  Procedure Laterality Date   ANGIOPLASTY / STENTING FEMORAL  March 2017   CHOLECYSTECTOMY     COLONOSCOPY WITH PROPOFOL N/A 01/02/2018   Procedure: COLONOSCOPY WITH PROPOFOL;  Surgeon: Lucilla Lame, MD;  Location: [facility] ENDOSCOPY;  Service: Endoscopy;  Laterality: N/A;   ESOPHAGOGASTRODUODENOSCOPY (EGD) WITH PROPOFOL N/A 01/02/2018   Procedure: ESOPHAGOGASTRODUODENOSCOPY (EGD) WITH PROPOFOL;  Surgeon: Lucilla Lame, MD;  Location: [facility] ENDOSCOPY;  Service: Endoscopy;  Laterality: N/A;   LAPAROSCOPY N/A 02/26/2018   Procedure: LAPAROSCOPY DIAGNOSTIC WITH BIOPSIES;  Surgeon: Rubie Maid, MD;  Location: [facility] ORS;  Service: Gynecology;  Laterality: N/A;    Lytics Catheter Placement  11/16/15   MELANOMA EXCISION  2018   Back   ROBOTIC ASSISTED LAPAROSCOPIC CHOLECYSTECTOMY-SINGLE SITE  12/29/2016    Current Outpatient Medications on File Prior to Visit  Medication Sig Dispense Refill   montelukast (SINGULAIR) 10 MG tablet TAKE 1 TABLET(10 MG) BY MOUTH AT BEDTIME 90 tablet 0   acetaminophen (TYLENOL) 325 MG tablet Take 650 mg by mouth every 6 (six) hours as needed.     aspirin 81 MG EC tablet Take 1 tablet by mouth daily.     diclofenac Sodium (VOLTAREN) 1 % GEL Apply 4 g topically 4 (four) times daily. 200 g 1   esomeprazole (NEXIUM) 40 MG capsule Take 1 capsule (40 mg total) by mouth 2 (two) times daily before a meal. 60 capsule 1   fluticasone (FLONASE) 50 MCG/ACT nasal spray Place 2 sprays into both nostrils daily. 16 g 2   levocetirizine (XYZAL) 5 MG tablet Take 1 tablet (5 mg total) by mouth every evening. 30 tablet 11   Multiple Vitamin (MULTIVITAMIN) tablet Take 1 tablet by mouth daily.     ondansetron (ZOFRAN) 4 MG tablet TAKE 1 TABLET(4 MG) BY MOUTH EVERY 8 HOURS AS NEEDED FOR NAUSEA OR VOMITING 20 tablet 0   polyethylene glycol powder (GLYCOLAX/MIRALAX) 17 GM/SCOOP powder Take 17 g by mouth 2 (two) times daily as needed for moderate constipation. 255 g 0   Sod Fluoride-Potassium  Nitrate (PREVIDENT 5000 SENSITIVE) 1.1-5 % PSTE Prevident 5000 Enamel Protect 1.1 %-5 % dental paste  BRUSH TEETH WITH PASTE FOR AT LEAST ONE MINUTE 2 TO 3 TIMES DAILY     No current facility-administered medications on file prior to visit.    Allergies  Allergen Reactions   Ortho Tri-Cyclen [Norgestimate-Eth Estradiol] Other (See Comments)    Blood clot and PE   Prednisone Anaphylaxis   Augmentin [Amoxicillin-Pot Clavulanate] Other (See Comments)    Upsets her stomach   Doxycycline     Nausea, diarrhea    Social History   Socioeconomic History   Marital status: Married    Spouse name: Not on file   Number of children: Not on file   Years of  education: Not on file   Highest education level: Not on file  Occupational History   Not on file  Tobacco Use   Smoking status: Never   Smokeless tobacco: Never  Vaping Use   Vaping Use: Never used  Substance and Sexual Activity   Alcohol use: Not Currently    Comment: rare   Drug use: No   Sexual activity: Not Currently    Birth control/protection: None  Other Topics Concern   Not on file  Social History Narrative   Not on file   Social Determinants of Health   Financial Resource Strain: Not on file  Food Insecurity: Not on file  Transportation Needs: Not on file  Physical Activity: Not on file  Stress: Not on file  Social Connections: Not on file  Intimate Partner Violence: Not on file    Family History  Problem Relation Age of Onset   Thyroid disease Mother    Diabetes Mother    Cancer Father        bladder   Bladder Cancer Father    Hypertension Maternal Grandmother    Hypothyroidism Maternal Grandmother    Thyroid disease Paternal Grandmother    Diabetes Paternal Grandmother    Stroke Paternal Grandmother    Cancer Paternal Grandfather        skin   Heart disease Neg Hx    COPD Neg Hx     The following portions of the patient's history were reviewed and updated as appropriate: allergies, current medications, past family history, past medical history, past social history, past surgical history and problem list.  Review of Systems Review of Systems - Negative except as mentioned in HPI Review of Systems - General ROS: negative for - chills, fatigue, fever, hot flashes, malaise or night sweats Hematological and Lymphatic ROS: negative for - bleeding problems or swollen lymph nodes Gastrointestinal ROS: negative for - abdominal pain, blood in stools, change in bowel habits and nausea/vomiting Musculoskeletal ROS: negative for - joint pain, muscle pain or muscular weakness Genito-Urinary ROS: negative for - change in menstrual cycle, dysmenorrhea,  dyspareunia, dysuria, genital discharge, genital ulcers, hematuria, incontinence, irregular/heavy menses, nocturia .   Objective:   There were no vitals taken for this visit. CONSTITUTIONAL: Well-developed, well-nourished female in no acute distress.  HENT:  Normocephalic, atraumatic.  NECK: Normal range of motion, supple, no masses.  Normal thyroid.  SKIN: Skin is warm and dry. No rash noted. Not diaphoretic. No erythema. No pallor. [place]: Alert and oriented to person, place, and time. PSYCHIATRIC: Normal mood and affect. Normal behavior. Normal judgment and thought content. CARDIOVASCULAR:Not Examined RESPIRATORY: Not Examined BREASTS: Not Examined ABDOMEN: Soft, non distended; Non tender.  No Organomegaly. PELVIC:  External Genitalia: Normal  BUS: Normal  Vagina: Normal  Cervix: Normal, cervical motion tenderness present   Uterus: Normal size, shape,consistency, mobile  Adnexa: Normal, mild tenderness on exam   RV: Normal   Bladder: Nontender MUSCULOSKELETAL: Normal range of motion. No tenderness.  No cyanosis, clubbing, or edema.     Assessment:   Pelvic pain    Plan:   Swab collected and u/s ordered for follow up of fibroid, r/o ovarian cysts and congestive pelvic syndrome.  Discussed use of pelvic floor therapy should all test be negative. Discussed use of birth control to help control menstrual cycle pain/bleeding. She is hesitant due to her history of DVT. Discussed use of progestin only method and explained that it is category 2 . She will consider and discussed with her PCP. Will follow up with results.  Philip Aspen, CNM

## 2022-05-10 NOTE — Patient Instructions (Signed)
Pelvic Pain, Female Pelvic pain is pain in your lower belly (abdomen), below your belly button and between your hips. The pain may: Start all of a sudden (be acute). Keep coming back (be recurring). Last a long time (become chronic). Pelvic pain that lasts longer than 6 months is called chronic pelvic pain. There are many causes of pelvic pain. Sometimes the cause of pelvic pain is not known. Follow these instructions at home:  Take over-the-counter and prescription medicines only as told by your doctor. Rest as told by your doctor. Do not have sex if it hurts. Keep a journal of your pelvic pain. Write down: When the pain started. Where the pain is located. What seems to make the pain better or worse, such as food or your monthly period (menstrual cycle). Any symptoms you have along with the pain. Keep all follow-up visits. Contact a doctor if: Medicine does not help your pain, or your pain comes back. You have new symptoms. You have unusual discharge or bleeding from your vagina. You have a fever or chills. You are having trouble pooping (constipation). You have blood in your pee (urine) or poop (stool). Your pee smells bad. You feel weak or light-headed. Get help right away if: You have sudden pain that is very bad. You have very bad pain and also have any of these symptoms: A fever. Feeling like you may vomit (nauseous). Vomiting. Being very sweaty. You faint. These symptoms may be an emergency. Get help right away. Call your local emergency services (911 in the [place] Do not wait to see if the symptoms will go away. Do not drive yourself to the hospital. Summary Pelvic pain is pain in your lower belly (abdomen), below your belly button and between your hips. There are many causes of pelvic pain. Keep a journal of your pelvic pain. This information is not intended to replace advice given to you by your health care provider. Make sure you discuss any questions you have with  your health care provider. Document Revised: 02/16/2021 Document Reviewed: 02/16/2021 Elsevier Patient Education  [place].

## 2022-05-12 LAB — CERVICOVAGINAL ANCILLARY ONLY
Bacterial Vaginitis (gardnerella): NEGATIVE
Candida Glabrata: NEGATIVE
Candida Vaginitis: NEGATIVE
Chlamydia: NEGATIVE
Comment: NEGATIVE
Comment: NEGATIVE
Comment: NEGATIVE
Comment: NEGATIVE
Comment: NEGATIVE
Comment: NORMAL
Neisseria Gonorrhea: NEGATIVE
Trichomonas: NEGATIVE

## 2022-05-16 ENCOUNTER — Ambulatory Visit (INDEPENDENT_AMBULATORY_CARE_PROVIDER_SITE_OTHER): Payer: 59

## 2022-05-16 ENCOUNTER — Encounter: Payer: Self-pay | Admitting: Certified Nurse Midwife

## 2022-05-16 DIAGNOSIS — R102 Pelvic and perineal pain: Secondary | ICD-10-CM

## 2022-05-18 ENCOUNTER — Encounter: Payer: Self-pay | Admitting: Certified Nurse Midwife

## 2022-05-23 ENCOUNTER — Telehealth: Payer: Self-pay | Admitting: Gastroenterology

## 2022-05-23 NOTE — Telephone Encounter (Signed)
Patient calling to reschedule her appointment because something has come up. What day could I reschedule this for?

## 2022-05-24 NOTE — Telephone Encounter (Signed)
Pt has already r/s appt

## 2022-05-25 ENCOUNTER — Telehealth: Payer: 59 | Admitting: Gastroenterology

## 2022-06-02 ENCOUNTER — Other Ambulatory Visit: Payer: Self-pay | Admitting: Certified Nurse Midwife

## 2022-06-02 DIAGNOSIS — R102 Pelvic and perineal pain: Secondary | ICD-10-CM

## 2022-06-08 ENCOUNTER — Encounter: Payer: Self-pay | Admitting: Nurse Practitioner

## 2022-06-14 ENCOUNTER — Encounter: Payer: Self-pay | Admitting: Nurse Practitioner

## 2022-06-14 ENCOUNTER — Ambulatory Visit (INDEPENDENT_AMBULATORY_CARE_PROVIDER_SITE_OTHER): Payer: 59 | Admitting: Nurse Practitioner

## 2022-06-14 ENCOUNTER — Other Ambulatory Visit: Payer: Self-pay

## 2022-06-14 VITALS — BP 120/72 | HR 98 | Temp 98.1°F | Resp 18 | Ht 69.0 in | Wt 163.3 lb

## 2022-06-14 DIAGNOSIS — K5909 Other constipation: Secondary | ICD-10-CM

## 2022-06-14 MED ORDER — LINACLOTIDE 72 MCG PO CAPS: 72.0000 ug | ORAL_CAPSULE | Freq: Every day | ORAL | 0 refills | Status: DC

## 2022-06-14 NOTE — Progress Notes (Signed)
BP 120/72   Pulse 98   Temp 98.1 F (36.7 C) (Oral)   Resp 18   Ht '5\' 9"'$  (1.753 m)   Wt 163 lb 4.8 oz (74.1 kg)   SpO2 99%   BMI 24.12 kg/m    Subjective:    Patient ID: Ana Knapp, female    DOB: April 06, 1981, 41 y.o.   MRN: [id number]  HPI: Ana Knapp is a 41 y.o. female  Chief Complaint  Patient presents with   Constipation    For 2 weeks   Constipation: She says that for the last two weeks she has had increasing constipation. She says that her last BM was this am a small amount. Patient states she is having abdominal pain with the constipation. Patient states she feels like she is not emptying out.  She says she is able to eat.  She says she is eating a diet high in fiber and drinking a lot of water. She is taking miralax once a day.  She says that when she was seen in the emergency room she was told to take miralax two times a day. She says she has not tried that yet.  She had a colonoscopy done in 01/02/2018.  Her GI is Dr. Durwin Reges. Patient reports that she has taken trulance before but it gave her diarrhea and does not want to take that again. Discussed Linzess, she would like to try that.  Discussed concerning signs and symptoms to watch out for.  She says she does have an appointment with Dr. Durwin Reges in October. She is going to call and see about getting an earlier appointment. Patient also has a uterine fibroid and is going to be getting pelvic floor exercises.   Relevant past medical, surgical, family and social history reviewed and updated as indicated. Interim medical history since our last visit reviewed. Allergies and medications reviewed and updated.  Review of Systems  Constitutional: Negative for fever or weight change.  Respiratory: Negative for cough and shortness of breath.   Cardiovascular: Negative for chest pain or palpitations.  Gastrointestinal: positive for abdominal pain,constipation Musculoskeletal: Negative for gait problem or joint swelling.   Skin: Negative for rash.  Neurological: Negative for dizziness or headache.  No other specific complaints in a complete review of systems (except as listed in HPI above).      Objective:    BP 120/72   Pulse 98   Temp 98.1 F (36.7 C) (Oral)   Resp 18   Ht '5\' 9"'$  (1.753 m)   Wt 163 lb 4.8 oz (74.1 kg)   SpO2 99%   BMI 24.12 kg/m   Wt Readings from Last 3 Encounters:  06/14/22 163 lb 4.8 oz (74.1 kg)  05/10/22 165 lb 6.4 oz (75 kg)  01/31/22 162 lb 9.6 oz (73.8 kg)    Physical Exam  Constitutional: Patient appears well-developed and well-nourished. No distress.  HEENT: head atraumatic, normocephalic, pupils equal and reactive to light, neck supple Cardiovascular: Normal rate, regular rhythm and normal heart sounds.  No murmur heard. No BLE edema. Pulmonary/Chest: Effort normal and breath sounds normal. No respiratory distress. Abdominal: Soft.  Tenderness in the right upper quadrant Psychiatric: Patient has a normal mood and affect. behavior is normal. Judgment and thought content normal.     Assessment & Plan:   Problem List Items Addressed This Visit   None Visit Diagnoses     Chronic constipation    -  Primary   Increase fiber and water  in diet,  take linzess daily, follow up with GI Dr. Durwin Reges.    Relevant Medications   linaclotide (LINZESS) 72 MCG capsule        Follow up plan: Return if symptoms worsen or fail to improve.

## 2022-06-19 ENCOUNTER — Encounter: Payer: Self-pay | Admitting: Nurse Practitioner

## 2022-06-19 ENCOUNTER — Other Ambulatory Visit: Payer: Self-pay | Admitting: Family Medicine

## 2022-06-19 DIAGNOSIS — N945 Secondary dysmenorrhea: Secondary | ICD-10-CM

## 2022-06-19 DIAGNOSIS — N809 Endometriosis, unspecified: Secondary | ICD-10-CM

## 2022-06-19 DIAGNOSIS — D259 Leiomyoma of uterus, unspecified: Secondary | ICD-10-CM

## 2022-06-20 ENCOUNTER — Other Ambulatory Visit: Payer: Self-pay | Admitting: Nurse Practitioner

## 2022-06-20 DIAGNOSIS — D259 Leiomyoma of uterus, unspecified: Secondary | ICD-10-CM

## 2022-06-20 DIAGNOSIS — N945 Secondary dysmenorrhea: Secondary | ICD-10-CM

## 2022-06-20 DIAGNOSIS — N809 Endometriosis, unspecified: Secondary | ICD-10-CM

## 2022-06-20 MED ORDER — ONDANSETRON HCL 4 MG PO TABS: ORAL_TABLET | ORAL | 0 refills | Status: DC

## 2022-06-28 ENCOUNTER — Telehealth: Payer: Self-pay | Admitting: Gastroenterology

## 2022-06-28 MED ORDER — ESOMEPRAZOLE MAGNESIUM 40 MG PO CPDR: 40.0000 mg | DELAYED_RELEASE_CAPSULE | Freq: Two times a day (BID) | ORAL | 1 refills | Status: DC

## 2022-06-28 NOTE — Telephone Encounter (Signed)
To [facility]

## 2022-06-28 NOTE — Telephone Encounter (Signed)
Rx sent through [facility]  

## 2022-06-28 NOTE — Telephone Encounter (Signed)
Refill on nexium

## 2022-06-28 NOTE — Addendum Note (Signed)
Addended by: Lurlean Nanny on: 06/28/2022 02:28 PM   Modules accepted: Orders

## 2022-07-04 ENCOUNTER — Ambulatory Visit: Payer: 59 | Attending: Certified Nurse Midwife

## 2022-07-04 DIAGNOSIS — R278 Other lack of coordination: Secondary | ICD-10-CM | POA: Diagnosis not present

## 2022-07-04 DIAGNOSIS — R102 Pelvic and perineal pain: Secondary | ICD-10-CM | POA: Diagnosis not present

## 2022-07-04 DIAGNOSIS — M6289 Other specified disorders of muscle: Secondary | ICD-10-CM | POA: Insufficient documentation

## 2022-07-04 NOTE — Therapy (Signed)
OUTPATIENT PHYSICAL THERAPY FEMALE PELVIC EVALUATION   Patient Name: Ana Knapp MRN: [id number] DOB:06-27-1981, 41 y.o., female Today's Date: 07/04/2022   PT End of Session - 07/04/22 1700     Visit Number 1    Number of Visits 10    Date for PT Re-Evaluation 09/12/22    Authorization Type IE: 07/04/22    PT Start Time 1700    PT Stop Time 1740    PT Time Calculation (min) 40 min    Activity Tolerance Patient tolerated treatment well             Past Medical History:  Diagnosis Date   Chronic sinusitis    Fibromyalgia    GERD (gastroesophageal reflux disease)    Headache    sinus   History of melanoma 11/22/2017   History of melanoma 2018   Back   May-Thurner syndrome 11/30/2015   January 2017; 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[facility]    PONV (postoperative nausea and vomiting)    Pulmonary embolism [facility])    Past Surgical History:  Procedure Laterality Date   ANGIOPLASTY / STENTING FEMORAL  March 2017   CHOLECYSTECTOMY     COLONOSCOPY WITH PROPOFOL N/A 01/02/2018   Procedure: COLONOSCOPY WITH PROPOFOL;  Surgeon: Lucilla Lame, MD;  Location: [facility] ENDOSCOPY;  Service: Endoscopy;  Laterality: N/A;   ESOPHAGOGASTRODUODENOSCOPY (EGD) WITH PROPOFOL N/A 01/02/2018   Procedure: ESOPHAGOGASTRODUODENOSCOPY (EGD) WITH PROPOFOL;  Surgeon: Lucilla Lame, MD;  Location: [facility] ENDOSCOPY;  Service: Endoscopy;  Laterality: N/A;   LAPAROSCOPY N/A 02/26/2018   Procedure: LAPAROSCOPY DIAGNOSTIC WITH BIOPSIES;  Surgeon: Rubie Maid, MD;  Location: [facility] ORS;  Service: Gynecology;  Laterality: N/A;   Lytics Catheter Placement  11/16/15   MELANOMA EXCISION  2018   Back   ROBOTIC ASSISTED LAPAROSCOPIC CHOLECYSTECTOMY-SINGLE SITE  12/29/2016   Patient Active Problem List   Diagnosis Date Noted   Pelvic pain 05/10/2022   Endometriosis 03/01/2018   Abnormal CT scan    History of melanoma 11/22/2017   Biliary dyskinesia 12/05/2016   Constipation 08/18/2016   May-Thurner syndrome 11/30/2015   DVT  (deep venous thrombosis) ([place]) 11/13/2015   History of pulmonary embolism 11/13/2015   GERD (gastroesophageal reflux disease) 09/28/2015   Flushing 09/24/2015   Fibromyalgia 08/30/2015   Seasonal allergic rhinitis 08/30/2015   Fibrositis 08/30/2015    PCP: Steele Sizer, MD  REFERRING PROVIDER: Philip Aspen, CNM   REFERRING DIAG:  R10.2 (ICD-10-CM) - Pelvic pain   THERAPY DIAG:  Pelvic pain  Pelvic floor dysfunction  Other lack of coordination  Rationale for Evaluation and Treatment: Rehabilitation  ONSET DATE: A few years ago   RED FLAGS: N/A  Have you had any night sweats? Unexplained weight loss? Saddle anesthesia? Unexplained changes in bowel or bladder habits?   SUBJECTIVE: Patient confirms identification and approves PT to assess pelvic floor and treatment Yes  PRECAUTIONS: None  WEIGHT BEARING RESTRICTIONS: No  FALLS:  Has patient fallen in last 6 months? 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 Pt feels like the pain is becoming more constant and on recent pelvic u/s were normal. Pt has been having "horrible" menstrual cycles especially the last 2 times. Pt can see blood clots in the toilet. B/c of the blood clots Pt is not able to take any type of birth control. Pt has to use overnight pads during menstrual cycle. Pt notices more pain when she is sitting down at rest.    PAIN:  Are you having pain? Yes NPRS scale: 3/10 (current), 10/10 (worst)  Pain location: Deep and Right  Pain type: aching and sharp Pain  description: intermittent, constant, sharp, and stabbing   Aggravating factors: period, stress, sitting longer periods of time  Relieving factors: some physical activity    PATIENT GOALS: Pt would like to decrease pain and to get back to normal BMs   UROLOGICAL HISTORY Pt has no concerns  Fluid intake:   Pain with urination:  Fully empty bladder:  Stream:  Urgency: No Frequency: 6-7x Nocturia: 0x   GASTROINTESTINAL HISTORY Pain with bowel movement: Yes Type of bowel movement:Type (Bristol Stool Scale) 6 and Strain Yes Frequency: every 1-5x/day with taking Miralax 2x per day and  Fully empty rectum: No  Leakage: No    SEXUAL HISTORY/FUNCTION Pain with intercourse: Initial Penetration, During Penetration, During Climax, and Pain Interrupts Intercourse Ability to have vaginal penetration:  No; Deep thrusting: No Able to achieve orgasm?: Yes, but pain   OBSTETRICAL HISTORY Vaginal deliveries: G0P0   GYNECOLOGICAL HISTORY Hysterectomy: no Pelvic Organ Prolapse: None Pain with exam: yes  Heaviness/pressure: no   OBJECTIVE:   DIAGNOSTIC TESTING/IMPRESSIONS:  05/16/22 U/S results:  Indications:Pelvic Pain, h/o Endometriosis  Impression: 1. Single Intramural fibroid, Right lateral fundus  COGNITION: Overall cognitive status: Within functional limits for tasks assessed     POSTURE:  Grossly R shoulders in sitting with posterior pelvic tilt Lumbar lordosis: Deferred 2/2 time constraints  Thoracic kyphosis: Iliac crest height:  Lumbar lateral shift:  Pelvic obliquity:  Leg length discrepancy:   GAIT: Deferred 2/2 time constraints Distance walked:  Comments:   Trendelenburg:   SENSATION: Deferred 2/2 time constraints Light touch: , L2-S2 dermatomes  Proprioception:    RANGE OF MOTION:  Deferred 2/2 time constraints  (Norm range in degrees)  LEFT  RIGHT   Lumbar forward flexion (65):      Lumbar extension (30):     Lumbar lateral flexion (25):      Thoracic and Lumbar rotation (30 degrees):       Hip Flexion (0-125):      Hip IR (0-45):     Hip ER (0-45):     Hip Adduction:      Hip Abduction (0-40):     Hip extension (0-15):     (*= pain, Blank rows = not tested)    STRENGTH: MMT Deferred 2/2 time constraints  RLE  LLE   Hip Flexion    Hip Extension    Hip Abduction     Hip Adduction     Hip ER     Hip IR     Knee Extension  Knee Flexion    Dorsiflexion     Plantarflexion (seated)    (*= pain, Blank rows = not tested)   SPECIAL TESTS: Deferred 2/2 time constraints Centralization and Peripheralization (SN 92, -LR 0.12):  Slump (SN 83, -LR 0.32):  SLR (SN 92, -LR 0.29): R: Lumbar quadrant (SN 70): R:  FABER (SN 81): FADIR (SN 94):  Hip scour (SN 50):  Thigh Thrust (SN 88, -LR 0.18) : Distraction (AL93):  Compression (SN/SP 69): Stork/March (SP 93):   PALPATION: Deferred 2/2 time constraints Abdominal:  Diastasis:  finger above umbilicus,  fingers at and below umbilicus  Scar mobility: present/mobile perpendicular, parallel Rib flare: present/absent  EXTERNAL PELVIC EXAM: Patient educated on the purpose of the pelvic exam and articulated understanding; patient consented to the exam verbally. Deferred 2/2 time constraints Palpation: Breath coordination: present/absent/inconsistent Voluntary Contraction: present/absent Relaxation: full/delayed/non-relaxing Perineal movement with sustained IAP increase ("bear down"): descent/no change/elevation/excessive descent Perineal movement with rapid IAP increase ("cough"): elevation/no change/descent Pubic symphysis: (0= no contraction, 1= flicker, 2= weak squeeze, 3= fair squeeze with lift, 4= good squeeze and lift against resistance, 5= strong squeeze against strong resistance)   INTERNAL PELVIC EXAM: Patient educated on the purpose of the pelvic exam and articulated understanding; patient consented to the exam verbally. Deferred 2/2 to time  constraints Introitus Appears:  Skin integrity:  Scar mobility: Strength (PERF):  Symmetry: Palpation: Prolapse: (0= no contraction, 1= flicker, 2= weak squeeze, 3= fair squeeze with lift, 4= good squeeze and lift against resistance, 5= strong squeeze against strong resistance)    TODAY'S TREATMENT: EVAL + Treat   Therapeutic Activity: DPT demonstration and discussion briefly on abdominal bowel massage to aid in motility. Will discuss further next session and Pt verbalized understanding.   Demonstration on toileting posture and using diaphragmatic breathing. DPT also discussed importance of limiting toileting time to avoid hemorrhoids or further complications such as pelvic organ prolapse.   Brief discussion on compression stockings to aid in mild LE edema (observation). Pt verbalized understanding.    Patient Education:  Patient educated on what to expect during course of physical therapy, POC, and provided with HEP including: toileting posture handout. Patient verbalized understanding and returned demonstration. Patient will benefit from further education in order to maximize compliance and understanding for long-term therapeutic gains.   Patient Surveys:  FOTO Bowel Constipation - 41 54 FOTO PFDI Pain - 38    ASSESSMENT:  Clinical Impression: Patient is a *** y.o. who was seen today for physical therapy evaluation and treatment for a chief concern of ***. PLOF. Today's evaluation suggest deficits in PFM coordination, breathing mechanics, PFM extensibility, posture, pain, scar mobility, gait, balance, hypermobility, as evidenced by ***. Upon physical examination Pt demonstrates *** as evidenced by ***. Patient's responses on FOTO *** indicates *** limitation/disability/distress. Patient's progress may be limited due to *** ; however, patient's *** is advantageous. Pt with basic understanding of ***. Patient will benefit from skilled therapeutic intervention to address deficits  in *** in order to increase PLOF and improve overall QOL.    Objective Impairments: decreased coordination, hypomobility, increased muscle spasms, improper body mechanics, postural dysfunction, and pain.   Activity Limitations: sitting, standing, and toileting  Personal Factors: Age, Past/current experiences, Time since onset of injury/illness/exacerbation, and 3+ comorbidities: fibromyalgia, hx of endometriosis, hx of DVT/PE  are also affecting patient's functional outcome.   Rehab Potential: Good  Clinical Decision Making: Evolving/moderate complexity  Evaluation Complexity: Moderate   GOALS: Goals reviewed with patient? Yes  SHORT  TERM GOALS: Target date: 08/08/2022  *** Baseline: Goal status: {GOALSTATUS:25110}  2.  *** Baseline:  Goal status: {GOALSTATUS:25110}  3.  *** Baseline:  Goal status: {GOALSTATUS:25110}  4.  *** Baseline:  Goal status: {GOALSTATUS:25110}  5.  *** Baseline:  Goal status: {GOALSTATUS:25110}  6.  *** Baseline:  Goal status: {GOALSTATUS:25110}  LONG TERM GOALS: Target date: 09/12/2022   *** Baseline:  Goal status: {GOALSTATUS:25110}  2.  *** Baseline:  Goal status: {GOALSTATUS:25110}  3.  *** Baseline:  Goal status: {GOALSTATUS:25110}  4.  *** Baseline:  Goal status: {GOALSTATUS:25110}  5.  *** Baseline:  Goal status: {GOALSTATUS:25110}  6.  *** Baseline:  Goal status: {GOALSTATUS:25110}  PLAN: PT Frequency: 1x/week  PT Duration: 10 weeks  Planned Interventions: Therapeutic exercises, Therapeutic activity, Neuromuscular re-education, Balance training, Gait training, Patient/Family education, Self Care, Joint mobilization, Spinal mobilization, Cryotherapy, Moist heat, scar mobilization, Taping, and Manual therapy  Plan For Next Session: phy assess, bowel massage  Sidharth Leverette, PT, DPT  07/04/2022, 7:56 PM

## 2022-07-09 ENCOUNTER — Telehealth: Payer: 59 | Admitting: Physician Assistant

## 2022-07-09 DIAGNOSIS — J019 Acute sinusitis, unspecified: Secondary | ICD-10-CM

## 2022-07-09 DIAGNOSIS — B9689 Other specified bacterial agents as the cause of diseases classified elsewhere: Secondary | ICD-10-CM

## 2022-07-09 MED ORDER — AZITHROMYCIN 250 MG PO TABS: ORAL_TABLET | ORAL | 0 refills | Status: AC

## 2022-07-09 MED ORDER — PSEUDOEPH-BROMPHEN-DM 30-2-10 MG/5ML PO SYRP: 5.0000 mL | ORAL_SOLUTION | Freq: Four times a day (QID) | ORAL | 0 refills | Status: DC | PRN

## 2022-07-09 MED ORDER — BENZONATATE 100 MG PO CAPS: 100.0000 mg | ORAL_CAPSULE | Freq: Three times a day (TID) | ORAL | 0 refills | Status: DC | PRN

## 2022-07-09 NOTE — Progress Notes (Signed)
Virtual Visit Consent   Ana Knapp, you are scheduled for a virtual visit with a [facility] provider today. Just as with appointments in the office, your consent must be obtained to participate. Your consent will be active for this visit and any virtual visit you may have with one of our providers in the next 365 days. If you have a MyChart account, a copy of this consent can be sent to you electronically.  As this is a virtual visit, video technology does not allow for your provider to perform a traditional examination. This may limit your provider's ability to fully assess your condition. If your provider identifies any concerns that need to be evaluated in person or the need to arrange testing (such as labs, EKG, etc.), we will make arrangements to do so. Although advances in technology are sophisticated, we cannot ensure that it will always work on either your end or our end. If the connection with a video visit is poor, the visit may have to be switched to a telephone visit. With either a video or telephone visit, we are not always able to ensure that we have a secure connection.  By engaging in this virtual visit, you consent to the provision of healthcare and authorize for your insurance to be billed (if applicable) for the services provided during this visit. Depending on your insurance coverage, you may receive a charge related to this service.  I need to obtain your verbal consent now. Are you willing to proceed with your visit today? Ana Knapp has provided verbal consent on 07/09/2022 for a virtual visit (video or telephone). Mar Daring, PA-C  Date: 07/09/2022 1:47 PM  Virtual Visit via Video Note   I, Mar Daring, [name]  ([id number], 1980-11-24) on 07/09/22 at  1:45 PM EDT by a video-enabled telemedicine application and verified that I am speaking with the correct person using two identifiers.  Location: Patient: Virtual  Visit Location Patient: Home Provider: Virtual Visit Location Provider: Home Office   I discussed the limitations of evaluation and management by telemedicine and the availability of in person appointments. The patient expressed understanding and agreed to proceed.    History of Present Illness: Ana Knapp is a 41 y.o. who identifies as a female who was assigned female at birth, and is being seen today for possible sinus infection.  HPI: Sinusitis This is a new problem. The current episode started more than 1 month ago (worsened yesterday). The problem has been gradually worsening since onset. There has been no fever (98.5 now). The pain is moderate. Associated symptoms include congestion, coughing, ear pain (congestion), headaches, a hoarse voice, sinus pressure and a sore throat. Pertinent negatives include no chills or shortness of breath. (Green mucous discharge, nausea) Treatments tried: zyxal, singulair, flonase, dayquil, nyquil. The treatment provided no relief.     Problems:  Patient Active Problem List   Diagnosis Date Noted   Pelvic pain 05/10/2022   Endometriosis 03/01/2018   Abnormal CT scan    History of melanoma 11/22/2017   Biliary dyskinesia 12/05/2016   Constipation 08/18/2016   May-Thurner syndrome 11/30/2015   DVT (deep venous thrombosis) ([place]) 11/13/2015   History of pulmonary embolism 11/13/2015   GERD (gastroesophageal reflux disease) 09/28/2015   Flushing 09/24/2015   Fibromyalgia 08/30/2015   Seasonal allergic rhinitis 08/30/2015   Fibrositis 08/30/2015    Allergies:  Allergies  Allergen Reactions   Ortho Tri-Cyclen [Norgestimate-Eth Estradiol] Other (See Comments)  Blood clot and PE   Prednisone Anaphylaxis   Augmentin [Amoxicillin-Pot Clavulanate] Other (See Comments)    Upsets her stomach   Doxycycline     Nausea, diarrhea   Medications:  Current Outpatient Medications:    azithromycin (ZITHROMAX) 250 MG tablet, Take 2 tablets on day 1,  then 1 tablet daily on days 2 through 5, Disp: 6 tablet, Rfl: 0   benzonatate (TESSALON) 100 MG capsule, Take 1 capsule (100 mg total) by mouth 3 (three) times daily as needed., Disp: 30 capsule, Rfl: 0   brompheniramine-pseudoephedrine-DM 30-2-10 MG/5ML syrup, Take 5 mLs by mouth 4 (four) times daily as needed., Disp: 120 mL, Rfl: 0   acetaminophen (TYLENOL) 325 MG tablet, Take 650 mg by mouth every 6 (six) hours as needed., Disp: , Rfl:    aspirin 81 MG EC tablet, Take 1 tablet by mouth daily., Disp: , Rfl:    diclofenac Sodium (VOLTAREN) 1 % GEL, Apply 4 g topically 4 (four) times daily. (Patient not taking: Reported on 07/04/2022), Disp: 200 g, Rfl: 1   esomeprazole (NEXIUM) 40 MG capsule, Take 1 capsule (40 mg total) by mouth 2 (two) times daily before a meal., Disp: 60 capsule, Rfl: 1   fluticasone (FLONASE) 50 MCG/ACT nasal spray, Place 2 sprays into both nostrils daily., Disp: 16 g, Rfl: 2   levocetirizine (XYZAL) 5 MG tablet, Take 1 tablet (5 mg total) by mouth every evening. (Patient not taking: Reported on 07/04/2022), Disp: 30 tablet, Rfl: 11   linaclotide (LINZESS) 72 MCG capsule, Take 1 capsule (72 mcg total) by mouth daily before breakfast. (Patient not taking: Reported on 07/04/2022), Disp: 30 capsule, Rfl: 0   montelukast (SINGULAIR) 10 MG tablet, TAKE 1 TABLET(10 MG) BY MOUTH AT BEDTIME, Disp: 90 tablet, Rfl: 0   Multiple Vitamin (MULTIVITAMIN) tablet, Take 1 tablet by mouth daily., Disp: , Rfl:    ondansetron (ZOFRAN) 4 MG tablet, TAKE 1 TABLET(4 MG) BY MOUTH EVERY 8 HOURS AS NEEDED FOR NAUSEA OR VOMITING, Disp: 20 tablet, Rfl: 0   polyethylene glycol powder (GLYCOLAX/MIRALAX) 17 GM/SCOOP powder, Take 17 g by mouth 2 (two) times daily as needed for moderate constipation., Disp: 255 g, Rfl: 0   Sod Fluoride-Potassium Nitrate (PREVIDENT 5000 SENSITIVE) 1.1-5 % PSTE, Prevident 5000 Enamel Protect 1.1 %-5 % dental paste  BRUSH TEETH WITH PASTE FOR AT LEAST ONE MINUTE 2 TO 3 TIMES DAILY  (Patient not taking: Reported on 07/04/2022), Disp: , Rfl:   Observations/Objective: Patient is well-developed, well-nourished in no acute distress.  Resting comfortably at home.  Head is normocephalic, atraumatic.  No labored breathing.  Speech is clear and coherent with logical content.  Patient is alert and oriented at baseline.  Congested voice  Assessment and Plan: 1. Acute bacterial sinusitis - azithromycin (ZITHROMAX) 250 MG tablet; Take 2 tablets on day 1, then 1 tablet daily on days 2 through 5  Dispense: 6 tablet; Refill: 0 - benzonatate (TESSALON) 100 MG capsule; Take 1 capsule (100 mg total) by mouth 3 (three) times daily as needed.  Dispense: 30 capsule; Refill: 0 - brompheniramine-pseudoephedrine-DM 30-2-10 MG/5ML syrup; Take 5 mLs by mouth 4 (four) times daily as needed.  Dispense: 120 mL; Refill: 0  - Worsening symptoms that have not responded to OTC medications.  - Will give Azithromycin  - Bromfed DM and tessalon prescribed for cough - Continue allergy medications.  - Steam and humidifier can help - Stay well hydrated and get plenty of rest.  - Seek in person evaluation  if no symptom improvement or if symptoms worsen   Follow Up Instructions: I discussed the assessment and treatment plan with the patient. The patient was provided an opportunity to ask questions and all were answered. The patient agreed with the plan and demonstrated an understanding of the instructions.  A copy of instructions were sent to the patient via MyChart unless otherwise noted below.    The patient was advised to call back or seek an in-person evaluation if the symptoms worsen or if the condition fails to improve as anticipated.  Time:  I spent 11 minutes with the patient via telehealth technology discussing the above problems/concerns.    Mar Daring, PA-C

## 2022-07-09 NOTE — Patient Instructions (Signed)
[name], thank you for joining Mar Daring, PA-C for today's virtual visit.  While this provider is not your primary care provider (PCP), if your PCP is located in our provider database this encounter information will be shared with them immediately following your visit.  Consent: (Patient) Ana Knapp provided verbal consent for this virtual visit at the beginning of the encounter.  Current Medications:  Current Outpatient Medications:    azithromycin (ZITHROMAX) 250 MG tablet, Take 2 tablets on day 1, then 1 tablet daily on days 2 through 5, Disp: 6 tablet, Rfl: 0   benzonatate (TESSALON) 100 MG capsule, Take 1 capsule (100 mg total) by mouth 3 (three) times daily as needed., Disp: 30 capsule, Rfl: 0   brompheniramine-pseudoephedrine-DM 30-2-10 MG/5ML syrup, Take 5 mLs by mouth 4 (four) times daily as needed., Disp: 120 mL, Rfl: 0   acetaminophen (TYLENOL) 325 MG tablet, Take 650 mg by mouth every 6 (six) hours as needed., Disp: , Rfl:    aspirin 81 MG EC tablet, Take 1 tablet by mouth daily., Disp: , Rfl:    diclofenac Sodium (VOLTAREN) 1 % GEL, Apply 4 g topically 4 (four) times daily. (Patient not taking: Reported on 07/04/2022), Disp: 200 g, Rfl: 1   esomeprazole (NEXIUM) 40 MG capsule, Take 1 capsule (40 mg total) by mouth 2 (two) times daily before a meal., Disp: 60 capsule, Rfl: 1   fluticasone (FLONASE) 50 MCG/ACT nasal spray, Place 2 sprays into both nostrils daily., Disp: 16 g, Rfl: 2   levocetirizine (XYZAL) 5 MG tablet, Take 1 tablet (5 mg total) by mouth every evening. (Patient not taking: Reported on 07/04/2022), Disp: 30 tablet, Rfl: 11   linaclotide (LINZESS) 72 MCG capsule, Take 1 capsule (72 mcg total) by mouth daily before breakfast. (Patient not taking: Reported on 07/04/2022), Disp: 30 capsule, Rfl: 0   montelukast (SINGULAIR) 10 MG tablet, TAKE 1 TABLET(10 MG) BY MOUTH AT BEDTIME, Disp: 90 tablet, Rfl: 0   Multiple Vitamin (MULTIVITAMIN) tablet,  Take 1 tablet by mouth daily., Disp: , Rfl:    ondansetron (ZOFRAN) 4 MG tablet, TAKE 1 TABLET(4 MG) BY MOUTH EVERY 8 HOURS AS NEEDED FOR NAUSEA OR VOMITING, Disp: 20 tablet, Rfl: 0   polyethylene glycol powder (GLYCOLAX/MIRALAX) 17 GM/SCOOP powder, Take 17 g by mouth 2 (two) times daily as needed for moderate constipation., Disp: 255 g, Rfl: 0   Sod Fluoride-Potassium Nitrate (PREVIDENT 5000 SENSITIVE) 1.1-5 % PSTE, Prevident 5000 Enamel Protect 1.1 %-5 % dental paste  BRUSH TEETH WITH PASTE FOR AT LEAST ONE MINUTE 2 TO 3 TIMES DAILY (Patient not taking: Reported on 07/04/2022), Disp: , Rfl:    Medications ordered in this encounter:  Meds ordered this encounter  Medications   azithromycin (ZITHROMAX) 250 MG tablet    Sig: Take 2 tablets on day 1, then 1 tablet daily on days 2 through 5    Dispense:  6 tablet    Refill:  0    Order Specific Question:   Supervising Provider    Answer:   Chase Picket [[id number]]   benzonatate (TESSALON) 100 MG capsule    Sig: Take 1 capsule (100 mg total) by mouth 3 (three) times daily as needed.    Dispense:  30 capsule    Refill:  0    Order Specific Question:   Supervising Provider    Answer:   Chase Picket [id number]   brompheniramine-pseudoephedrine-DM 30-2-10 MG/5ML syrup    Sig: Take 5 mLs  by mouth 4 (four) times daily as needed.    Dispense:  120 mL    Refill:  0    Order Specific Question:   Supervising Provider    Answer:   Chase Picket [[id number]]     *If you need refills on other medications prior to your next appointment, please contact your pharmacy*  Follow-Up: Call back or seek an in-person evaluation if the symptoms worsen or if the condition fails to improve as anticipated.  Other Instructions Sinus Infection, Adult A sinus infection, also called sinusitis, is inflammation of your sinuses. Sinuses are hollow spaces in the bones around your face. Your sinuses are located: Around your eyes. In the middle of your  forehead. Behind your nose. In your cheekbones. Mucus normally drains out of your sinuses. When your nasal tissues become inflamed or swollen, mucus can become trapped or blocked. This allows bacteria, viruses, and fungi to grow, which leads to infection. Most infections of the sinuses are caused by a virus. A sinus infection can develop quickly. It can last for up to 4 weeks (acute) or for more than 12 weeks (chronic). A sinus infection often develops after a cold. What are the causes? This condition is caused by anything that creates swelling in the sinuses or stops mucus from draining. This includes: Allergies. Asthma. Infection from bacteria or viruses. Deformities or blockages in your nose or sinuses. Abnormal growths in the nose (nasal polyps). Pollutants, such as chemicals or irritants in the air. Infection from fungi. This is rare. What increases the risk? You are more likely to develop this condition if you: Have a weak body defense system (immune system). Do a lot of swimming or diving. Overuse nasal sprays. Smoke. What are the signs or symptoms? The main symptoms of this condition are pain and a feeling of pressure around the affected sinuses. Other symptoms include: Stuffy nose or congestion that makes it difficult to breathe through your nose. Thick yellow or greenish drainage from your nose. Tenderness, swelling, and warmth over the affected sinuses. A cough that may get worse at night. Decreased sense of smell and taste. Extra mucus that collects in the throat or the back of the nose (postnasal drip) causing a sore throat or bad breath. Tiredness (fatigue). Fever. How is this diagnosed? This condition is diagnosed based on: Your symptoms. Your medical history. A physical exam. Tests to find out if your condition is acute or chronic. This may include: Checking your nose for nasal polyps. Viewing your sinuses using a device that has a light (endoscope). Testing for  allergies or bacteria. Imaging tests, such as an MRI or CT scan. In rare cases, a bone biopsy may be done to rule out more serious types of fungal sinus disease. How is this treated? Treatment for a sinus infection depends on the cause and whether your condition is chronic or acute. If caused by a virus, your symptoms should go away on their own within 10 days. You may be given medicines to relieve symptoms. They include: Medicines that shrink swollen nasal passages (decongestants). A spray that eases inflammation of the nostrils (topical intranasal corticosteroids). Rinses that help get rid of thick mucus in your nose (nasal saline washes). Medicines that treat allergies (antihistamines). Over-the-counter pain relievers. If caused by bacteria, your health care provider may recommend waiting to see if your symptoms improve. Most bacterial infections will get better without antibiotic medicine. You may be given antibiotics if you have: A severe infection. A weak immune  system. If caused by narrow nasal passages or nasal polyps, surgery may be needed. Follow these instructions at home: Medicines Take, use, or apply over-the-counter and prescription medicines only as told by your health care provider. These may include nasal sprays. If you were prescribed an antibiotic medicine, take it as told by your health care provider. Do not stop taking the antibiotic even if you start to feel better. Hydrate and humidify  Drink enough fluid to keep your urine pale yellow. Staying hydrated will help to thin your mucus. Use a cool mist humidifier to keep the humidity level in your home above 50%. Inhale steam for 10-15 minutes, 3-4 times a day, or as told by your health care provider. You can do this in the bathroom while a hot shower is running. Limit your exposure to cool or dry air. Rest Rest as much as possible. Sleep with your head raised (elevated). Make sure you get enough sleep each  night. General instructions  Apply a warm, moist washcloth to your face 3-4 times a day or as told by your health care provider. This will help with discomfort. Use nasal saline washes as often as told by your health care provider. Wash your hands often with soap and water to reduce your exposure to germs. If soap and water are not available, use hand sanitizer. Do not smoke. Avoid being around people who are smoking (secondhand smoke). Keep all follow-up visits. This is important. Contact a health care provider if: You have a fever. Your symptoms get worse. Your symptoms do not improve within 10 days. Get help right away if: You have a severe headache. You have persistent vomiting. You have severe pain or swelling around your face or eyes. You have vision problems. You develop confusion. Your neck is stiff. You have trouble breathing. These symptoms may be an emergency. Get help right away. Call 911. Do not wait to see if the symptoms will go away. Do not drive yourself to the hospital. Summary A sinus infection is soreness and inflammation of your sinuses. Sinuses are hollow spaces in the bones around your face. This condition is caused by nasal tissues that become inflamed or swollen. The swelling traps or blocks the flow of mucus. This allows bacteria, viruses, and fungi to grow, which leads to infection. If you were prescribed an antibiotic medicine, take it as told by your health care provider. Do not stop taking the antibiotic even if you start to feel better. Keep all follow-up visits. This is important. This information is not intended to replace advice given to you by your health care provider. Make sure you discuss any questions you have with your health care provider. Document Revised: 09/14/2021 Document Reviewed: 09/14/2021 Elsevier Patient Education  [employer].    If you have been instructed to have an in-person evaluation today at a local Urgent Care  facility, please use the link below. It will take you to a list of all of our available [facility], including address, phone number and hours of operation. Please do not delay care.  [facility]  If you or a family member do not have a primary care provider, use the link below to schedule a visit and establish care. When you choose a [facility] primary care physician or advanced practice provider, you gain a long-term partner in health. Find a Primary Care Provider  Learn more about 's in-office and virtual care options: [facility]

## 2022-07-13 ENCOUNTER — Ambulatory Visit: Payer: 59

## 2022-07-13 DIAGNOSIS — M6289 Other specified disorders of muscle: Secondary | ICD-10-CM | POA: Diagnosis not present

## 2022-07-13 DIAGNOSIS — R102 Pelvic and perineal pain: Secondary | ICD-10-CM | POA: Diagnosis not present

## 2022-07-13 DIAGNOSIS — R278 Other lack of coordination: Secondary | ICD-10-CM

## 2022-07-13 NOTE — Therapy (Signed)
OUTPATIENT PHYSICAL THERAPY FEMALE PELVIC TREATMENT   Patient Name: Stephanne Greeley MRN: [id number] DOB:[birth year], 41 y.o., female Today's Date: 07/13/2022   PT End of Session - 07/13/22 1318     Visit Number 2    Number of Visits 10    Date for PT Re-Evaluation 09/12/22    Authorization Type IE: 07/04/22    PT Start Time 1315    PT Stop Time 1355    PT Time Calculation (min) 40 min    Activity Tolerance Patient tolerated treatment well             Past Medical History:  Diagnosis Date   Chronic sinusitis    Fibromyalgia    GERD (gastroesophageal reflux disease)    Headache    sinus   History of melanoma 11/22/2017   History of melanoma 2018   Back   May-Thurner syndrome 11/30/2015   January 2017; 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[facility]    PONV (postoperative nausea and vomiting)    Pulmonary embolism [facility])    Past Surgical History:  Procedure Laterality Date   ANGIOPLASTY / STENTING FEMORAL  March 2017   CHOLECYSTECTOMY     COLONOSCOPY WITH PROPOFOL N/A 01/02/2018   Procedure: COLONOSCOPY WITH PROPOFOL;  Surgeon: Lucilla Lame, MD;  Location: [facility] ENDOSCOPY;  Service: Endoscopy;  Laterality: N/A;   ESOPHAGOGASTRODUODENOSCOPY (EGD) WITH PROPOFOL N/A 01/02/2018   Procedure: ESOPHAGOGASTRODUODENOSCOPY (EGD) WITH PROPOFOL;  Surgeon: Lucilla Lame, MD;  Location: [facility] ENDOSCOPY;  Service: Endoscopy;  Laterality: N/A;   LAPAROSCOPY N/A 02/26/2018   Procedure: LAPAROSCOPY DIAGNOSTIC WITH BIOPSIES;  Surgeon: Rubie Maid, MD;  Location: [facility] ORS;  Service: Gynecology;  Laterality: N/A;   Lytics Catheter Placement  11/16/15   MELANOMA EXCISION  2018   Back   ROBOTIC ASSISTED LAPAROSCOPIC CHOLECYSTECTOMY-SINGLE SITE  12/29/2016   Patient Active Problem List   Diagnosis Date Noted   Pelvic pain 05/10/2022   Endometriosis 03/01/2018   Abnormal CT scan    History of melanoma 11/22/2017   Biliary dyskinesia 12/05/2016   Constipation 08/18/2016   May-Thurner syndrome 11/30/2015   DVT  (deep venous thrombosis) ([place]) 11/13/2015   History of pulmonary embolism 11/13/2015   GERD (gastroesophageal reflux disease) 09/28/2015   Flushing 09/24/2015   Fibromyalgia 08/30/2015   Seasonal allergic rhinitis 08/30/2015   Fibrositis 08/30/2015    PCP: Steele Sizer, MD  REFERRING PROVIDER: Philip Aspen, CNM   REFERRING DIAG:  R10.2 (ICD-10-CM) - Pelvic pain   THERAPY DIAG:  Pelvic pain  Pelvic floor dysfunction  Other lack of coordination  Rationale for Evaluation and Treatment: Rehabilitation  ONSET DATE: A few years ago  PRECAUTIONS: None  WEIGHT BEARING RESTRICTIONS: No  FALLS:  Has patient fallen in last 6 months? 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 Pt feels like the pain is becoming more constant and on recent pelvic u/s were normal. Pt has been having "horrible" menstrual cycles especially the last 2 times. Pt can see blood clots in the toilet. B/c of the blood clots Pt is not able to take any type of birth control. Pt has to use overnight pads during menstrual cycle. Pt notices more pain when she is sitting down at rest.   Pain location: Deep and Right  Pain type: aching and sharp Pain description: intermittent, constant, sharp, and stabbing   Aggravating factors: period, stress, sitting longer periods of time  Relieving factors: some physical activity    PATIENT GOALS: Pt would like to decrease pain and to get back to normal BMs   UROLOGICAL HISTORY Pt has no concerns  Fluid intake:   Pain with urination:  Fully empty bladder:  Stream:  Urgency: No Frequency: 6-7x Nocturia:  0x   GASTROINTESTINAL HISTORY Pain with bowel movement: Yes Type of bowel movement:Type (Bristol Stool Scale) 6 and Strain Yes Frequency: every 1-5x/day with taking Miralax 2x per day and  Fully empty rectum: No  Leakage: No    SEXUAL HISTORY/FUNCTION Pain with intercourse: Initial Penetration, During Penetration, During Climax, and Pain Interrupts Intercourse Ability to have vaginal penetration:  No; Deep thrusting: No Able to achieve orgasm?: Yes, but pain   OBSTETRICAL HISTORY Vaginal deliveries: G0P0   GYNECOLOGICAL HISTORY Hysterectomy: no Pelvic Organ Prolapse: None Pain with exam: yes  Heaviness/pressure: no   SUBJECTIVE:  Pt had a bad sinus infection since last session (IE) but is feeling better now.        PAIN:  Are you having pain? Yes NPRS scale: 3/10 (current), R lower abdominals    TODAY'S TREATMENT:  Neuromuscular Reeducation:  Pre-treatment assessment:  OBJECTIVE:   DIAGNOSTIC TESTING/IMPRESSIONS:  05/16/22 U/S results:  Indications:Pelvic Pain, h/o Endometriosis  Impression: 1. Single Intramural fibroid, Right lateral fundus  COGNITION: Overall cognitive status: Within functional limits for tasks assessed     POSTURE:   Iliac crest height: L iliac crest, L shoulder elevated, B rounding of shoulders in sitting  Pelvic obliquity: WNL   RANGE OF MOTION:    (Norm range in degrees)  LEFT 07/13/22 RIGHT 07/13/22  Lumbar forward flexion (65):  WNL    Lumbar extension (30): WNL    Lumbar lateral flexion (25):  WNL WNL   Thoracic and Lumbar rotation (30 degrees):    WNL WNL   Hip Flexion (0-125):   WNL WNL  Hip IR (0-45):  Slight restriction WNL  Hip ER (0-45):  WNL WNL  Hip Adduction:      Hip Abduction (0-40):  WNL WNL  Hip extension (0-15):     (*= pain, Blank rows = not tested)    STRENGTH: MMT   RLE 07/13/22 LLE 07/13/22  Hip Flexion 5 5  Hip Extension 4 4  Hip Abduction     Hip Adduction     Hip ER  5 5  Hip IR  5 5   Knee Extension 5 5  Knee Flexion 5 5  Dorsiflexion     Plantarflexion (seated) 5 5  (*= pain, Blank rows = not tested)  SPECIAL TESTS:   FABER (SN 81): negative B FADIR (SN 94): negative B   PALPATION:  Abdominal:  Diastasis: none Scar mobility: R and L of umbilicus from stent placement   -Significant tension throughout abdomen particularly RLQ and by umbilicus, increased pain at RLQ with noticeable Valsalva maneuver   EXTERNAL PELVIC EXAM: Patient educated on the purpose of the pelvic exam and articulated understanding; patient consented to the exam verbally. Deferred 2/2 time constraints Palpation: Breath coordination: present/absent/inconsistent Voluntary Contraction: present/absent Relaxation: full/delayed/non-relaxing Perineal movement with sustained IAP increase ("bear down"): descent/no change/elevation/excessive descent Perineal movement with rapid IAP increase ("cough"): elevation/no change/descent Pubic symphysis: (0= no contraction, 1= flicker, 2= weak squeeze, 3= fair squeeze with lift, 4= good squeeze and lift against resistance, 5= strong squeeze against strong resistance)    Manual Therapy: Abdominal myofascial release for improved fascial sling mobility and pain modulation  Increased myofascial tension throughout abdomen particularly on the R side and RLQ  Increased pain but with diaphragmatic breathing and myofascial release around the abdomen, Pt with decreased pain    Neuromuscular Re-education: Supine hooklying diaphragmatic breathing with VCs and TCs for downregulation of the nervous system and improved management of IAP  Discussion and demonstration on log roll technique for improved IAP management and to decrease lower back pain or worsening of pelvic pain due to increased pressure.   Patient response to interventions: Pt felt her abdomen was less tense and begin to hear "noises" in relation to bowels   Patient Education:  Patient provided  with HEP including: supine diaphragmatic breathing and myofascial abdominal release. Patient educated throughout session on appropriate technique and form using multi-modal cueing, HEP, and activity modification. Patient will benefit from further education in order to maximize compliance and understanding for long-term therapeutic gains.    ASSESSMENT:  Clinical Impression: Patient presents to clinic with excellent motivation to participate in today's session. Upon physical assessment, Pt demonstrates deficits in ROM, PFM coordination, PFM extensibility, posture, scar mobility, and pain as evidenced by B rounded shoulders in sitting with observed posterior pelvic tilt, L iliac crest higher with increased L shoulder elevation, restriction in L hip IR ROM, 4/5 MMT of B hip extension, 3 small scars from stent placement from PE event, significant abdominal tension upon superficial palpation with increased tenderness/pain. Myofascial abdominal release explained and demonstrated to Pt which eased tenderness/pain at the end. Discussion on increased sympathetic nervous system response that can affect tissues/musculature in the abdomen which ultimately can affect motility/natural peristalsis. Pt verbalized understanding. After manual interventions, Pt reports her abdomen felt less "tense." Pt responded well to manual, active, and educational interventions. Patient will benefit from skilled therapeutic intervention to address deficits in ROM, PFM coordination, PFM extensibility, posture, scar mobility, and pain in order to increase PLOF and improve overall QOL.    Objective Impairments: decreased coordination, hypomobility, increased muscle spasms, improper body mechanics, postural dysfunction, and pain.   Activity Limitations: sitting, standing, and toileting  Personal Factors: Age, Past/current experiences, Time since onset of injury/illness/exacerbation, and 3+ comorbidities: fibromyalgia, hx of  endometriosis, hx of DVT/PE  are also affecting patient's functional outcome.   Rehab Potential: Good  Clinical Decision Making: Evolving/moderate complexity  Evaluation Complexity: Moderate   GOALS: Goals reviewed with patient? Yes  SHORT TERM GOALS: Target date: 08/08/2022  Patient will demonstrate independence with HEP in order to maximize therapeutic gains and improve carryover from physical therapy sessions to ADLs in the home and community. Baseline: toileting posture handout Goal status: INITIAL  LONG TERM GOALS: Target date: 09/12/2022   Patient will score >/= 54 on FOTO Bowel Constipation  in order to demonstrate decreased pain, improved PFM coordination, improved PFM extensibility and overall QOL.  Baseline: 41 Goal status: INITIAL  2.  Patient will demonstrate coordinated lengthening and relaxation of PFM with diaphragmatic inhalation in order to decrease spasm and allow for unrestricted elimination of urine/feces for improved overall QOL. Baseline: hx of significant constipation  Goal status: INITIAL  3.  Patient will decrease worst pain as reported on NPRS by at least 2 points to demonstrate clinically significant reduction in pain in order to restore/improve function and overall QOL. Baseline: 10/10 pelvic pain  Goal status: INITIAL  4.  Patient will report being able to return to activities including, but not limited to: penetrative sex, sitting for long periods of time, physical activity, and annual GYN exams without pain or limitation to indicate complete resolution of the chief concern and return to prior level of participation at home and in the community. Baseline: inability to have penetrative sex, increased pain with the exams and sitting Goal status: INITIAL  5.  Patient will demonstrate understanding of basic self-management/down-regulation of the nervous system for persistent pain condition and stress as evidenced by diaphragmatic breathing without  cueing and body scan/progressive relaxation meditation in order to transition to independent management of patient's chief concern: pelvic pain. Baseline: will continue to assess in further sessions Goal status: INITIAL    PLAN: PT Frequency: 1x/week  PT Duration: 10 weeks  Planned Interventions: Therapeutic exercises, Therapeutic activity, Neuromuscular re-education, Balance training, Gait training, Patient/Family education, Self Care, Joint mobilization, Spinal mobilization, Cryotherapy, Moist heat, scar mobilization, Taping, and Manual therapy  Plan For Next Session: PFM external, show bowel massage, manual? How was breathing   Jyl Chico, PT, DPT  07/13/2022, 1:19 PM

## 2022-07-18 ENCOUNTER — Ambulatory Visit: Payer: 59

## 2022-07-18 DIAGNOSIS — R102 Pelvic and perineal pain unspecified side: Secondary | ICD-10-CM

## 2022-07-18 DIAGNOSIS — R278 Other lack of coordination: Secondary | ICD-10-CM

## 2022-07-18 DIAGNOSIS — M6289 Other specified disorders of muscle: Secondary | ICD-10-CM | POA: Diagnosis not present

## 2022-07-18 NOTE — Therapy (Signed)
OUTPATIENT PHYSICAL THERAPY FEMALE PELVIC TREATMENT   Patient Name: Ana Knapp MRN: [id number] DOB:April 16, 1981, 41 y.o., female Today's Date: 07/18/2022   PT End of Session - 07/18/22 0847     Visit Number 3    Number of Visits 10    Date for PT Re-Evaluation 09/12/22    Authorization Type IE: 07/04/22    PT Start Time 0847    PT Stop Time 0925    PT Time Calculation (min) 38 min    Activity Tolerance Patient tolerated treatment well             Past Medical History:  Diagnosis Date   Chronic sinusitis    Fibromyalgia    GERD (gastroesophageal reflux disease)    Headache    sinus   History of melanoma 11/22/2017   History of melanoma 2018   Back   May-Thurner syndrome 11/30/2015   January 2017; 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[facility]    PONV (postoperative nausea and vomiting)    Pulmonary embolism [facility])    Past Surgical History:  Procedure Laterality Date   ANGIOPLASTY / STENTING FEMORAL  March 2017   CHOLECYSTECTOMY     COLONOSCOPY WITH PROPOFOL N/A 01/02/2018   Procedure: COLONOSCOPY WITH PROPOFOL;  Surgeon: Lucilla Lame, MD;  Location: [facility] ENDOSCOPY;  Service: Endoscopy;  Laterality: N/A;   ESOPHAGOGASTRODUODENOSCOPY (EGD) WITH PROPOFOL N/A 01/02/2018   Procedure: ESOPHAGOGASTRODUODENOSCOPY (EGD) WITH PROPOFOL;  Surgeon: Lucilla Lame, MD;  Location: [facility] ENDOSCOPY;  Service: Endoscopy;  Laterality: N/A;   LAPAROSCOPY N/A 02/26/2018   Procedure: LAPAROSCOPY DIAGNOSTIC WITH BIOPSIES;  Surgeon: Rubie Maid, MD;  Location: [facility] ORS;  Service: Gynecology;  Laterality: N/A;   Lytics Catheter Placement  11/16/15   MELANOMA EXCISION  2018   Back   ROBOTIC ASSISTED LAPAROSCOPIC CHOLECYSTECTOMY-SINGLE SITE  12/29/2016   Patient Active Problem List   Diagnosis Date Noted   Pelvic pain 05/10/2022   Endometriosis 03/01/2018   Abnormal CT scan    History of melanoma 11/22/2017   Biliary dyskinesia 12/05/2016   Constipation 08/18/2016   May-Thurner syndrome 11/30/2015   DVT  (deep venous thrombosis) ([place]) 11/13/2015   History of pulmonary embolism 11/13/2015   GERD (gastroesophageal reflux disease) 09/28/2015   Flushing 09/24/2015   Fibromyalgia 08/30/2015   Seasonal allergic rhinitis 08/30/2015   Fibrositis 08/30/2015    PCP: Steele Sizer, MD  REFERRING PROVIDER: Philip Aspen, CNM   REFERRING DIAG:  R10.2 (ICD-10-CM) - Pelvic pain   THERAPY DIAG:  Pelvic pain  Pelvic floor dysfunction  Other lack of coordination  Rationale for Evaluation and Treatment: Rehabilitation  ONSET DATE: A few years ago  PRECAUTIONS: None  WEIGHT BEARING RESTRICTIONS: No  FALLS:  Has patient fallen in last 6 months? 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 Pt feels like the pain is becoming more constant and on recent pelvic u/s were normal. Pt has been having "horrible" menstrual cycles especially the last 2 times. Pt can see blood clots in the toilet. B/c of the blood clots Pt is not able to take any type of birth control. Pt has to use overnight pads during menstrual cycle. Pt notices more pain when she is sitting down at rest.   Pain location: Deep and Right  Pain type: aching and sharp Pain description: intermittent, constant, sharp, and stabbing   Aggravating factors: period, stress, sitting longer periods of time  Relieving factors: some physical activity    PATIENT GOALS: Pt would like to decrease pain and to get back to normal BMs   UROLOGICAL HISTORY Pt has no concerns  Fluid intake:   Pain with urination:  Fully empty bladder:  Stream:  Urgency: No Frequency: 6-7x Nocturia:  0x   GASTROINTESTINAL HISTORY Pain with bowel movement: Yes Type of bowel movement:Type (Bristol Stool Scale) 6 and Strain Yes Frequency: every 1-5x/day with taking Miralax 2x per day and  Fully empty rectum: No  Leakage: No    SEXUAL HISTORY/FUNCTION Pain with intercourse: Initial Penetration, During Penetration, During Climax, and Pain Interrupts Intercourse Ability to have vaginal penetration:  No; Deep thrusting: No Able to achieve orgasm?: Yes, but pain   OBSTETRICAL HISTORY Vaginal deliveries: G0P0   GYNECOLOGICAL HISTORY Hysterectomy: no Pelvic Organ Prolapse: None Pain with exam: yes  Heaviness/pressure: no   SUBJECTIVE:  Pt was able to have a BM but not as much as she would like. DPT reiterated that a little bit is more than being constipated.      PAIN:  Are you having pain? Yes NPRS scale: 6/10 (current), R lower abdominals     OBJECTIVE:   DIAGNOSTIC TESTING/IMPRESSIONS:  05/16/22 U/S results:  Indications:Pelvic Pain, h/o Endometriosis  Impression: 1. Single Intramural fibroid, Right lateral fundus  COGNITION: Overall cognitive status: Within functional limits for tasks assessed     POSTURE:   Iliac crest height: L iliac crest, L shoulder elevated, B rounding of shoulders in sitting  Pelvic obliquity: WNL   RANGE OF MOTION:    (Norm range in degrees)  LEFT 07/13/22 RIGHT 07/13/22  Lumbar forward flexion (65):  WNL    Lumbar extension (30): WNL    Lumbar lateral flexion (25):  WNL WNL   Thoracic and Lumbar rotation (30 degrees):    WNL WNL   Hip Flexion (0-125):   WNL WNL  Hip IR (0-45):  Slight restriction WNL  Hip ER (0-45):  WNL WNL  Hip Adduction:      Hip Abduction (0-40):  WNL WNL  Hip extension (0-15):     (*= pain, Blank rows = not tested)    STRENGTH: MMT   RLE 07/13/22 LLE 07/13/22  Hip Flexion 5 5  Hip Extension 4 4  Hip Abduction     Hip Adduction     Hip ER  5 5  Hip IR  5 5  Knee Extension 5 5  Knee Flexion 5 5   Dorsiflexion     Plantarflexion (seated) 5 5  (*= pain, Blank rows = not  tested)   SPECIAL TESTS:   FABER (SN 81): negative B FADIR (SN 94): negative B   PALPATION:  Abdominal:  Diastasis: none Scar mobility: R and L of umbilicus from stent placement   -Significant tension throughout abdomen particularly RLQ and by umbilicus, increased pain at RLQ with noticeable Valsalva maneuver    TODAY'S TREATMENT:   Manual Therapy: Abdominal myofascial release for improved fascial sling mobility and pain modulation  Increased tenderness to RLQ but more likely due to beginning of menstrual cycle   Bowel massage technique and demonstration for HEP. Pt verbalized understanding.    Neuromuscular Re-education: Supine hooklying diaphragmatic breathing with VCs and TCs for downregulation of the nervous system and improved management of IAP  EXTERNAL PELVIC EXAM: Patient educated on the purpose of the pelvic exam and articulated understanding; patient consented to the exam verbally.  Breath coordination: present Voluntary Contraction: present, 2/5 MMT Relaxation: full Perineal movement with sustained IAP increase ("bear down"): elevation, when asked again - no change Perineal movement with rapid IAP increase ("cough"): no change (0= no contraction, 1= flicker, 2= weak squeeze, 3= fair squeeze with lift, 4= good squeeze and lift against resistance, 5= strong squeeze against strong resistance)   Discussion on posture in sitting in relation to increased pressure and tension at the abdominal area. Also discussion on other over-the-counter laxatives available other than Miralax and natural remedies as well. Pt given handout and advised to talk to pharmacist if any other questions arise.   Patient response to interventions: Pt felt better at the abdomen than beginning of session but still having some pain due to menstrual cycle.    Patient Education:  Patient provided with HEP including:  supine diaphragmatic breathing and bowel massage technique handout. Patient educated throughout session on appropriate technique and form using multi-modal cueing, HEP, and activity modification. Patient will benefit from further education in order to maximize compliance and understanding for long-term therapeutic gains.    ASSESSMENT:  Clinical Impression: Patient presents to clinic with excellent motivation to participate in today's session. Pt with increased pain in the RLQ, 6/10, more likely a mix of chief concern and menstrual cycle. Upon PFM external exam, Pt demonstrates deficits in PFM coordination, PFM strength, and PFM extensibility as evidenced by 2/5 MMT of PFM, elevation of PFM with sustained IAP increase ("bear down") and Valsalva maneuver. Pt also continues to demonstrate deficits in posture, scar mobility, and pain. Significant time taken to demonstrate and discuss bowel massage technique to aid in myofascial release and intestinal motility. Pt verbalized understanding and provided with handout for guidance. Pt required minimal verbal cueing for supine diaphragmatic breathing and discussed importance of diaphragmatic breathing during toileting and throughout the day. Pt responded well to manual, active, and educational interventions. Patient will benefit from skilled therapeutic intervention to address deficits in ROM, PFM coordination, PFM extensibility, posture, scar mobility, and pain in order to increase PLOF and improve overall QOL.    Objective Impairments: decreased coordination, hypomobility, increased muscle spasms, improper body mechanics, postural dysfunction, and pain.   Activity Limitations: sitting, standing, and toileting  Personal Factors: Age, Past/current experiences, Time since onset of injury/illness/exacerbation, and 3+ comorbidities: fibromyalgia, hx of endometriosis, hx of DVT/PE  are also affecting patient's functional outcome.   Rehab Potential:  Good  Clinical Decision Making: Evolving/moderate complexity  Evaluation Complexity: Moderate   GOALS: Goals reviewed with patient? Yes  SHORT TERM GOALS: Target date: 08/08/2022  Patient will demonstrate independence with HEP in order to maximize therapeutic gains  and improve carryover from physical therapy sessions to ADLs in the home and community. Baseline: toileting posture handout Goal status: INITIAL    LONG TERM GOALS: Target date: 09/12/2022   Patient will score >/= 54 on FOTO Bowel Constipation  in order to demonstrate decreased pain, improved PFM coordination, improved PFM extensibility and overall QOL.  Baseline: 41 Goal status: INITIAL  2.  Patient will demonstrate coordinated lengthening and relaxation of PFM with diaphragmatic inhalation in order to decrease spasm and allow for unrestricted elimination of urine/feces for improved overall QOL. Baseline: hx of significant constipation  Goal status: INITIAL  3.  Patient will decrease worst pain as reported on NPRS by at least 2 points to demonstrate clinically significant reduction in pain in order to restore/improve function and overall QOL. Baseline: 10/10 pelvic pain  Goal status: INITIAL  4.  Patient will report being able to return to activities including, but not limited to: penetrative sex, sitting for long periods of time, physical activity, and annual GYN exams without pain or limitation to indicate complete resolution of the chief concern and return to prior level of participation at home and in the community. Baseline: inability to have penetrative sex, increased pain with the exams and sitting Goal status: INITIAL  5.  Patient will demonstrate understanding of basic self-management/down-regulation of the nervous system for persistent pain condition and stress as evidenced by diaphragmatic breathing without cueing and body scan/progressive relaxation meditation in order to transition to independent  management of patient's chief concern: pelvic pain. Baseline: will continue to assess in further sessions Goal status: INITIAL    PLAN: PT Frequency: 1x/week  PT Duration: 10 weeks  Planned Interventions: Therapeutic exercises, Therapeutic activity, Neuromuscular re-education, Balance training, Gait training, Patient/Family education, Self Care, Joint mobilization, Spinal mobilization, Cryotherapy, Moist heat, scar mobilization, Taping, and Manual therapy  Plan For Next Session: posture in sitting, toileting track, PFM lengthening     Kisean Rollo, PT, DPT  07/18/2022, 8:49 AM

## 2022-07-25 ENCOUNTER — Ambulatory Visit: Payer: 59

## 2022-07-27 ENCOUNTER — Ambulatory Visit: Payer: 59 | Attending: Certified Nurse Midwife

## 2022-07-27 DIAGNOSIS — R278 Other lack of coordination: Secondary | ICD-10-CM | POA: Diagnosis not present

## 2022-07-27 DIAGNOSIS — R102 Pelvic and perineal pain unspecified side: Secondary | ICD-10-CM

## 2022-07-27 DIAGNOSIS — M6289 Other specified disorders of muscle: Secondary | ICD-10-CM | POA: Diagnosis not present

## 2022-07-27 NOTE — Therapy (Signed)
OUTPATIENT PHYSICAL THERAPY FEMALE PELVIC TREATMENT   Patient Name: Ana Knapp MRN: [id number] DOB:[birth year], 41 y.o., female Today's Date: 07/27/2022   PT End of Session - 07/27/22 1615     Visit Number 4    Number of Visits 10    Date for PT Re-Evaluation 09/12/22    Authorization Type IE: 07/04/22    PT Start Time 1615    PT Stop Time 7628    PT Time Calculation (min) 38 min    Activity Tolerance Patient tolerated treatment well             Past Medical History:  Diagnosis Date   Chronic sinusitis    Fibromyalgia    GERD (gastroesophageal reflux disease)    Headache    sinus   History of melanoma 11/22/2017   History of melanoma 2018   Back   May-Thurner syndrome 11/30/2015   January 2017; 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[facility]    PONV (postoperative nausea and vomiting)    Pulmonary embolism [facility])    Past Surgical History:  Procedure Laterality Date   ANGIOPLASTY / STENTING FEMORAL  March 2017   CHOLECYSTECTOMY     COLONOSCOPY WITH PROPOFOL N/A 01/02/2018   Procedure: COLONOSCOPY WITH PROPOFOL;  Surgeon: Lucilla Lame, MD;  Location: [facility] ENDOSCOPY;  Service: Endoscopy;  Laterality: N/A;   ESOPHAGOGASTRODUODENOSCOPY (EGD) WITH PROPOFOL N/A 01/02/2018   Procedure: ESOPHAGOGASTRODUODENOSCOPY (EGD) WITH PROPOFOL;  Surgeon: Lucilla Lame, MD;  Location: [facility] ENDOSCOPY;  Service: Endoscopy;  Laterality: N/A;   LAPAROSCOPY N/A 02/26/2018   Procedure: LAPAROSCOPY DIAGNOSTIC WITH BIOPSIES;  Surgeon: Rubie Maid, MD;  Location: [facility] ORS;  Service: Gynecology;  Laterality: N/A;   Lytics Catheter Placement  11/16/15   MELANOMA EXCISION  2018   Back   ROBOTIC ASSISTED LAPAROSCOPIC CHOLECYSTECTOMY-SINGLE SITE  12/29/2016   Patient Active Problem List   Diagnosis Date Noted   Pelvic pain 05/10/2022   Endometriosis 03/01/2018   Abnormal CT scan    History of melanoma 11/22/2017   Biliary dyskinesia 12/05/2016   Constipation 08/18/2016   May-Thurner syndrome 11/30/2015   DVT  (deep venous thrombosis) ([place]) 11/13/2015   History of pulmonary embolism 11/13/2015   GERD (gastroesophageal reflux disease) 09/28/2015   Flushing 09/24/2015   Fibromyalgia 08/30/2015   Seasonal allergic rhinitis 08/30/2015   Fibrositis 08/30/2015    PCP: Steele Sizer, MD  REFERRING PROVIDER: Philip Aspen, CNM   REFERRING DIAG:  R10.2 (ICD-10-CM) - Pelvic pain   THERAPY DIAG:  Pelvic pain  Pelvic floor dysfunction  Other lack of coordination  Rationale for Evaluation and Treatment: Rehabilitation  ONSET DATE: A few years ago  PRECAUTIONS: None  WEIGHT BEARING RESTRICTIONS: No  FALLS:  Has patient fallen in last 6 months? 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 Pt feels like the pain is becoming more constant and on recent pelvic u/s were normal. Pt has been having "horrible" menstrual cycles especially the last 2 times. Pt can see blood clots in the toilet. B/c of the blood clots Pt is not able to take any type of birth control. Pt has to use overnight pads during menstrual cycle. Pt notices more pain when she is sitting down at rest.   Pain location: Deep and Right  Pain type: aching and sharp Pain description: intermittent, constant, sharp, and stabbing   Aggravating factors: period, stress, sitting longer periods of time  Relieving factors: some physical activity    PATIENT GOALS: Pt would like to decrease pain and to get back to normal BMs   UROLOGICAL HISTORY Pt has no concerns  Fluid intake:   Pain with urination:  Fully empty bladder:  Stream:  Urgency: No Frequency: 6-7x Nocturia:  0x   GASTROINTESTINAL HISTORY Pain with bowel movement: Yes Type of bowel movement:Type (Bristol Stool Scale) 6 and Strain Yes Frequency: every 1-5x/day with taking Miralax 2x per day and  Fully empty rectum: No  Leakage: No    SEXUAL HISTORY/FUNCTION Pain with intercourse: Initial Penetration, During Penetration, During Climax, and Pain Interrupts Intercourse Ability to have vaginal penetration:  No; Deep thrusting: No Able to achieve orgasm?: Yes, but pain   OBSTETRICAL HISTORY Vaginal deliveries: G0P0   GYNECOLOGICAL HISTORY Hysterectomy: no Pelvic Organ Prolapse: None Pain with exam: yes  Heaviness/pressure: no   SUBJECTIVE:  Pt is having some external stressors today. Pt does not feel as bloated and was able to have more of a BM. DPT encourages Pt to continue bowel massage.     PAIN:  Are you having pain? Yes NPRS scale: 3/10, R lower abdominals     OBJECTIVE:   DIAGNOSTIC TESTING/IMPRESSIONS:  05/16/22 U/S results:  Indications:Pelvic Pain, h/o Endometriosis  Impression: 1. Single Intramural fibroid, Right lateral fundus  COGNITION: Overall cognitive status: Within functional limits for tasks assessed     POSTURE:   Iliac crest height: L iliac crest, L shoulder elevated, B rounding of shoulders in sitting  Pelvic obliquity: WNL   RANGE OF MOTION:    (Norm range in degrees)  LEFT 07/13/22 RIGHT 07/13/22  Lumbar forward flexion (65):  WNL    Lumbar extension (30): WNL    Lumbar lateral flexion (25):  WNL WNL   Thoracic and Lumbar rotation (30 degrees):    WNL WNL   Hip Flexion (0-125):   WNL WNL  Hip IR (0-45):  Slight restriction WNL  Hip ER (0-45):  WNL WNL  Hip Adduction:      Hip Abduction (0-40):  WNL WNL  Hip extension (0-15):     (*= pain, Blank rows = not tested)    STRENGTH: MMT   RLE 07/13/22 LLE 07/13/22  Hip Flexion 5 5  Hip Extension 4 4  Hip Abduction     Hip Adduction     Hip ER  5 5  Hip IR  5 5  Knee Extension 5 5   Knee Flexion 5 5  Dorsiflexion     Plantarflexion (seated) 5 5  (*= pain, Blank rows =  not tested)   SPECIAL TESTS:   FABER (SN 81): negative B FADIR (SN 94): negative B   PALPATION:  Abdominal:  Diastasis: none Scar mobility: R and L of umbilicus from stent placement   -Significant tension throughout abdomen particularly RLQ and by umbilicus, increased pain at RLQ with noticeable Valsalva maneuver   EXTERNAL PELVIC EXAM: Patient educated on the purpose of the pelvic exam and articulated understanding; patient consented to the exam verbally.  Breath coordination: present Voluntary Contraction: present, 2/5 MMT Relaxation: full Perineal movement with sustained IAP increase ("bear down"): elevation, when asked again - no change Perineal movement with rapid IAP increase ("cough"): no change (0= no contraction, 1= flicker, 2= weak squeeze, 3= fair squeeze with lift, 4= good squeeze and lift against resistance, 5= strong squeeze against strong resistance)   TODAY'S TREATMENT:  Neuromuscular Re-education: Discussion on the vagus nerve and its relation to the parasympathetic nervous system. Also discussion on the sympathetic nervous system (fight or flight mode) in relation to pain. Pt verbalized understanding.   Toilet track via audio Education officer, community) practicing with squatty potty to aid in PFM lengthening and downregulation of nervous system.  Supine hooklying diaphragmatic breathing with VCs and TCs for downregulation of the nervous system and improved management of IAP  Supine hooklying PFM lengthening techniques/pain modulation with diaphragmatic breathing, VCs and TCs as needed              B Single knee to chest   Double knee to chest   Butterfly pose "adductor stretch"    Patient response to interventions: Pt felt a release at the PFM with butterfly pose and breathing   Patient Education:  Patient provided with HEP including: toileting track audio and PFM lengthening  techniques. Patient educated throughout session on appropriate technique and form using multi-modal cueing, HEP, and activity modification. Patient will benefit from further education in order to maximize compliance and understanding for long-term therapeutic gains.    ASSESSMENT:  Clinical Impression: Patient presents to clinic with excellent motivation to participate in today's session. Pt continues to demonstrate deficits in ROM, PFM coordination, PFM extensibility, posture, scar mobility, and pain. Pt with 3/10 pain in the R lower abdominals. Pt responded positively to the audio toileting track with squatty potty for improved PFM lengthening and downregulation of nervous system. Pt required moderate VCs and TCs for further PFM lengthening techniques to decrease bodily compensations. Pt reports feeling a "release" at the PFM with butterfly pose "adductor stretch" and breathing. Pt responded positively to active and educational interventions. Patient will benefit from skilled therapeutic intervention to address deficits in ROM, PFM coordination, PFM extensibility, posture, scar mobility, and pain in order to increase PLOF and improve overall QOL.    Objective Impairments: decreased coordination, hypomobility, increased muscle spasms, improper body mechanics, postural dysfunction, and pain.   Activity Limitations: sitting, standing, and toileting  Personal Factors: Age, Past/current experiences, Time since onset of injury/illness/exacerbation, and 3+ comorbidities: fibromyalgia, hx of endometriosis, hx of DVT/PE  are also affecting patient's functional outcome.   Rehab Potential: Good  Clinical Decision Making: Evolving/moderate complexity  Evaluation Complexity: Moderate   GOALS: Goals reviewed with patient? Yes  SHORT TERM GOALS: Target date: 08/08/2022  Patient will demonstrate independence with HEP in order to maximize therapeutic gains and improve carryover from physical therapy  sessions to ADLs in the home and community. Baseline: toileting posture handout Goal status: INITIAL    LONG TERM GOALS: Target date: 09/12/2022   Patient will score >/=  54 on FOTO Bowel Constipation  in order to demonstrate decreased pain, improved PFM coordination, improved PFM extensibility and overall QOL.  Baseline: 41 Goal status: INITIAL  2.  Patient will demonstrate coordinated lengthening and relaxation of PFM with diaphragmatic inhalation in order to decrease spasm and allow for unrestricted elimination of urine/feces for improved overall QOL. Baseline: hx of significant constipation  Goal status: INITIAL  3.  Patient will decrease worst pain as reported on NPRS by at least 2 points to demonstrate clinically significant reduction in pain in order to restore/improve function and overall QOL. Baseline: 10/10 pelvic pain  Goal status: INITIAL  4.  Patient will report being able to return to activities including, but not limited to: penetrative sex, sitting for long periods of time, physical activity, and annual GYN exams without pain or limitation to indicate complete resolution of the chief concern and return to prior level of participation at home and in the community. Baseline: inability to have penetrative sex, increased pain with the exams and sitting Goal status: INITIAL  5.  Patient will demonstrate understanding of basic self-management/down-regulation of the nervous system for persistent pain condition and stress as evidenced by diaphragmatic breathing without cueing and body scan/progressive relaxation meditation in order to transition to independent management of patient's chief concern: pelvic pain. Baseline: will continue to assess in further sessions Goal status: INITIAL    PLAN: PT Frequency: 1x/week  PT Duration: 10 weeks  Planned Interventions: Therapeutic exercises, Therapeutic activity, Neuromuscular re-education, Balance training, Gait training,  Patient/Family education, Self Care, Joint mobilization, Spinal mobilization, Cryotherapy, Moist heat, scar mobilization, Taping, and Manual therapy  Plan For Next Session: continue PFM lengthening techniques, posture in sitting, body relax track   [employer], PT, DPT  07/27/2022, 4:58 PM

## 2022-07-29 ENCOUNTER — Encounter: Payer: Self-pay | Admitting: Gastroenterology

## 2022-08-02 ENCOUNTER — Ambulatory Visit: Payer: 59

## 2022-08-03 ENCOUNTER — Ambulatory Visit: Payer: 59

## 2022-08-03 DIAGNOSIS — R102 Pelvic and perineal pain: Secondary | ICD-10-CM | POA: Diagnosis not present

## 2022-08-03 DIAGNOSIS — M6289 Other specified disorders of muscle: Secondary | ICD-10-CM

## 2022-08-03 DIAGNOSIS — R278 Other lack of coordination: Secondary | ICD-10-CM

## 2022-08-03 NOTE — Therapy (Signed)
OUTPATIENT PHYSICAL THERAPY FEMALE PELVIC TREATMENT   Patient Name: Ana Knapp MRN: [id number] DOB:[birth year], 41 y.o., female Today's Date: 08/03/2022   PT End of Session - 08/03/22 1149     Visit Number 5    Number of Visits 10    Date for PT Re-Evaluation 09/12/22    Authorization Type IE: 07/04/22    PT Start Time 1150    PT Stop Time 1230    PT Time Calculation (min) 40 min    Activity Tolerance Patient tolerated treatment well             Past Medical History:  Diagnosis Date   Chronic sinusitis    Fibromyalgia    GERD (gastroesophageal reflux disease)    Headache    sinus   History of melanoma 11/22/2017   History of melanoma 2018   Back   May-Thurner syndrome 11/30/2015   January 2017; 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[facility]    PONV (postoperative nausea and vomiting)    Pulmonary embolism [facility])    Past Surgical History:  Procedure Laterality Date   ANGIOPLASTY / STENTING FEMORAL  March 2017   CHOLECYSTECTOMY     COLONOSCOPY WITH PROPOFOL N/A 01/02/2018   Procedure: COLONOSCOPY WITH PROPOFOL;  Surgeon: Lucilla Lame, MD;  Location: [facility] ENDOSCOPY;  Service: Endoscopy;  Laterality: N/A;   ESOPHAGOGASTRODUODENOSCOPY (EGD) WITH PROPOFOL N/A 01/02/2018   Procedure: ESOPHAGOGASTRODUODENOSCOPY (EGD) WITH PROPOFOL;  Surgeon: Lucilla Lame, MD;  Location: [facility] ENDOSCOPY;  Service: Endoscopy;  Laterality: N/A;   LAPAROSCOPY N/A 02/26/2018   Procedure: LAPAROSCOPY DIAGNOSTIC WITH BIOPSIES;  Surgeon: Rubie Maid, MD;  Location: [facility] ORS;  Service: Gynecology;  Laterality: N/A;   Lytics Catheter Placement  11/16/15   MELANOMA EXCISION  2018   Back   ROBOTIC ASSISTED LAPAROSCOPIC CHOLECYSTECTOMY-SINGLE SITE  12/29/2016   Patient Active Problem List   Diagnosis Date Noted   Pelvic pain 05/10/2022   Endometriosis 03/01/2018   Abnormal CT scan    History of melanoma 11/22/2017   Biliary dyskinesia 12/05/2016   Constipation 08/18/2016   May-Thurner syndrome 11/30/2015   DVT  (deep venous thrombosis) ([place]) 11/13/2015   History of pulmonary embolism 11/13/2015   GERD (gastroesophageal reflux disease) 09/28/2015   Flushing 09/24/2015   Fibromyalgia 08/30/2015   Seasonal allergic rhinitis 08/30/2015   Fibrositis 08/30/2015    PCP: Steele Sizer, MD  REFERRING PROVIDER: Philip Aspen, CNM   REFERRING DIAG:  R10.2 (ICD-10-CM) - Pelvic pain   THERAPY DIAG:  Pelvic pain  Pelvic floor dysfunction  Other lack of coordination  Rationale for Evaluation and Treatment: Rehabilitation  ONSET DATE: A few years ago  PRECAUTIONS: None  WEIGHT BEARING RESTRICTIONS: No  FALLS:  Has patient fallen in last 6 months? 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 Pt feels like the pain is becoming more constant and on recent pelvic u/s were normal. Pt has been having "horrible" menstrual cycles especially the last 2 times. Pt can see blood clots in the toilet. B/c of the blood clots Pt is not able to take any type of birth control. Pt has to use overnight pads during menstrual cycle. Pt notices more pain when she is sitting down at rest.   Pain location: Deep and Right  Pain type: aching and sharp Pain description: intermittent, constant, sharp, and stabbing   Aggravating factors: period, stress, sitting longer periods of time  Relieving factors: some physical activity    PATIENT GOALS: Pt would like to decrease pain and to get back to normal BMs   UROLOGICAL HISTORY Pt has no concerns  Fluid intake:   Pain with urination:  Fully empty bladder:  Stream:  Urgency: No Frequency: 6-7x Nocturia:  0x   GASTROINTESTINAL HISTORY Pain with bowel movement: Yes Type of bowel movement:Type (Bristol Stool Scale) 6 and Strain Yes Frequency: every 1-5x/day with taking Miralax 2x per day and  Fully empty rectum: No  Leakage: No    SEXUAL HISTORY/FUNCTION Pain with intercourse: Initial Penetration, During Penetration, During Climax, and Pain Interrupts Intercourse Ability to have vaginal penetration:  No; Deep thrusting: No Able to achieve orgasm?: Yes, but pain   OBSTETRICAL HISTORY Vaginal deliveries: G0P0   GYNECOLOGICAL HISTORY Hysterectomy: no Pelvic Organ Prolapse: None Pain with exam: yes  Heaviness/pressure: no   SUBJECTIVE:  Pt has been having some external stressors and feels like her stress contributes to constipation. Pt is only using Miralax once a day. Pt had a BM last night but before had been 4 days.    PAIN:  Are you having pain? Yes NPRS scale: 3/10, R lower abdominals     OBJECTIVE:   DIAGNOSTIC TESTING/IMPRESSIONS:  05/16/22 U/S results:  Indications:Pelvic Pain, h/o Endometriosis  Impression: 1. Single Intramural fibroid, Right lateral fundus  COGNITION: Overall cognitive status: Within functional limits for tasks assessed     POSTURE:   Iliac crest height: L iliac crest, L shoulder elevated, B rounding of shoulders in sitting  Pelvic obliquity: WNL   RANGE OF MOTION:    (Norm range in degrees)  LEFT 07/13/22 RIGHT 07/13/22  Lumbar forward flexion (65):  WNL    Lumbar extension (30): WNL    Lumbar lateral flexion (25):  WNL WNL   Thoracic and Lumbar rotation (30 degrees):    WNL WNL   Hip Flexion (0-125):   WNL WNL  Hip IR (0-45):  Slight restriction WNL  Hip ER (0-45):  WNL WNL  Hip Adduction:      Hip Abduction (0-40):  WNL WNL  Hip extension (0-15):     (*= pain, Blank rows = not tested)    STRENGTH: MMT   RLE 07/13/22 LLE 07/13/22  Hip Flexion 5 5  Hip Extension 4 4  Hip Abduction     Hip Adduction     Hip ER  5 5  Hip  IR  5 5  Knee Extension 5 5  Knee Flexion 5 5  Dorsiflexion     Plantarflexion (seated) 5 5  (*=  pain, Blank rows = not tested)   SPECIAL TESTS:   FABER (SN 81): negative B FADIR (SN 94): negative B   PALPATION:  Abdominal:  Diastasis: none Scar mobility: R and L of umbilicus from stent placement   -Significant tension throughout abdomen particularly RLQ and by umbilicus, increased pain at RLQ with noticeable Valsalva maneuver   EXTERNAL PELVIC EXAM: Patient educated on the purpose of the pelvic exam and articulated understanding; patient consented to the exam verbally.  Breath coordination: present Voluntary Contraction: present, 2/5 MMT Relaxation: full Perineal movement with sustained IAP increase ("bear down"): elevation, when asked again - no change Perineal movement with rapid IAP increase ("cough"): no change (0= no contraction, 1= flicker, 2= weak squeeze, 3= fair squeeze with lift, 4= good squeeze and lift against resistance, 5= strong squeeze against strong resistance)   TODAY'S TREATMENT:  Manual Therapy: STM of surrounding musculature and ligaments surrounding the coccyx with hip IR movement and prone diaphragmatic breathing  Pt with more tenderness on L> R   Neuromuscular Re-education: Discussion on postural alignment in seated (shoulders over hips and sitting on ischial tuberosities and posterior thigh). Relating posture back to PFM and how malalignment of the spine can cause tension on the PFM. Pt verbalized understanding.   Discussion on tailbone/coccyx anatomy as Pt mentioned a fall in her 20's where the tailbone broke. Providing visual model of PFM and spinal anatomy. Pt verbalized understanding   Assessment of tailbone in prone -tenderness upon palpation of L bony prominences of tailbone -no deviation noted, slight flexion of tailbone but no tenderness upon palpation directly at tailbone  Supine hooklying diaphragmatic breathing with VCs and TCs for  downregulation of the nervous system and improved management of IAP  Supine hooklying PFM lengthening techniques/pain modulation with diaphragmatic breathing, VCs and TCs as needed              Child's pose  Cat/cow   Hip IR with coordinated exhale prone "the frog" pose   Patient response to interventions: Pt felt decreased ache in R lower abdominals compared to beginning of session   Patient Education:  Patient provided with HEP including: "the frog", cat/cow, childs pose. Patient educated throughout session on appropriate technique and form using multi-modal cueing, HEP, and activity modification. Patient will benefit from further education in order to maximize compliance and understanding for long-term therapeutic gains.    ASSESSMENT:  Clinical Impression: Patient presents to clinic with excellent motivation to participate in today's session. Pt continues to demonstrate deficits in ROM, PFM coordination, PFM extensibility, posture, scar mobility, and pain. Pt with 3/10 pain in the R lower abdominals but reports could be correlated with increased external stressors. New information gathered from Pt about tailbone breaking from skating accident in her 35's. Pt reports no constant pain but intermittently the tailbone will bother her. Upon assessment in prone, tailbone is slightly flexed and Pt had increased tenderness upon palpation on bony prominences of tailbone, L>R. Discussion of importance of posture in sitting (B rounded shoulders, increased thoracic kyphosis and forward flexion) in relation to PFM tension and increased pressure in the abdominal cavity as well as the coccyx. Pt verbalized understanding. Pt responded positively to manual, active and, educational interventions. Patient will continue to benefit from skilled therapeutic intervention to address deficits in ROM, PFM coordination, PFM extensibility, posture, scar mobility, and pain in order to increase PLOF and improve overall  QOL.    Objective Impairments: decreased coordination, hypomobility, increased muscle spasms, improper body mechanics, postural  dysfunction, and pain.   Activity Limitations: sitting, standing, and toileting  Personal Factors: Age, Past/current experiences, Time since onset of injury/illness/exacerbation, and 3+ comorbidities: fibromyalgia, hx of endometriosis, hx of DVT/PE  are also affecting patient's functional outcome.   Rehab Potential: Good  Clinical Decision Making: Evolving/moderate complexity  Evaluation Complexity: Moderate   GOALS: Goals reviewed with patient? Yes  SHORT TERM GOALS: Target date: 08/08/2022  Patient will demonstrate independence with HEP in order to maximize therapeutic gains and improve carryover from physical therapy sessions to ADLs in the home and community. Baseline: toileting posture handout Goal status: INITIAL    LONG TERM GOALS: Target date: 09/12/2022   Patient will score >/= 54 on FOTO Bowel Constipation  in order to demonstrate decreased pain, improved PFM coordination, improved PFM extensibility and overall QOL.  Baseline: 41 Goal status: INITIAL  2.  Patient will demonstrate coordinated lengthening and relaxation of PFM with diaphragmatic inhalation in order to decrease spasm and allow for unrestricted elimination of urine/feces for improved overall QOL. Baseline: hx of significant constipation  Goal status: INITIAL  3.  Patient will decrease worst pain as reported on NPRS by at least 2 points to demonstrate clinically significant reduction in pain in order to restore/improve function and overall QOL. Baseline: 10/10 pelvic pain  Goal status: INITIAL  4.  Patient will report being able to return to activities including, but not limited to: penetrative sex, sitting for long periods of time, physical activity, and annual GYN exams without pain or limitation to indicate complete resolution of the chief concern and return to prior level of  participation at home and in the community. Baseline: inability to have penetrative sex, increased pain with the exams and sitting Goal status: INITIAL  5.  Patient will demonstrate understanding of basic self-management/down-regulation of the nervous system for persistent pain condition and stress as evidenced by diaphragmatic breathing without cueing and body scan/progressive relaxation meditation in order to transition to independent management of patient's chief concern: pelvic pain. Baseline: will continue to assess in further sessions Goal status: INITIAL    PLAN: PT Frequency: 1x/week  PT Duration: 10 weeks  Planned Interventions: Therapeutic exercises, Therapeutic activity, Neuromuscular re-education, Balance training, Gait training, Patient/Family education, Self Care, Joint mobilization, Spinal mobilization, Cryotherapy, Moist heat, scar mobilization, Taping, and Manual therapy  Plan For Next Session: continue PFM lengthening techniques, posture in sitting strength, body relax track, more manual at [place], PT, DPT  08/03/2022, 12:35 PM

## 2022-08-09 ENCOUNTER — Encounter: Payer: Self-pay | Admitting: Gastroenterology

## 2022-08-09 ENCOUNTER — Telehealth (INDEPENDENT_AMBULATORY_CARE_PROVIDER_SITE_OTHER): Payer: 59 | Admitting: Gastroenterology

## 2022-08-09 VITALS — Wt 163.0 lb

## 2022-08-09 DIAGNOSIS — K219 Gastro-esophageal reflux disease without esophagitis: Secondary | ICD-10-CM | POA: Diagnosis not present

## 2022-08-09 DIAGNOSIS — K5900 Constipation, unspecified: Secondary | ICD-10-CM

## 2022-08-09 MED ORDER — LUBIPROSTONE 24 MCG PO CAPS: 24.0000 ug | ORAL_CAPSULE | Freq: Two times a day (BID) | ORAL | 3 refills | Status: DC

## 2022-08-09 MED ORDER — ESOMEPRAZOLE MAGNESIUM 40 MG PO CPDR: 40.0000 mg | DELAYED_RELEASE_CAPSULE | Freq: Two times a day (BID) | ORAL | 1 refills | Status: DC

## 2022-08-09 NOTE — Progress Notes (Signed)
Ana Lame, MD [address]  [place]  [place], [place] [address]  Main: [phone]  Fax: [phone]    Gastroenterology [place] Visit  Referring Provider:     Steele Sizer, MD Primary Care Physician:  Steele Sizer, MD Primary Gastroenterologist:  Dr.Journi Moffa [place] Reason for Consultation:     Medication refill and constipation        HPI:    Virtual Visit via Video Note Location of the patient: Work Location of provider: Office  Participating persons: The patient and myself.  I connected with Ana Knapp on 08/09/22 at  1:30 PM EDT by a video enabled telemedicine application and verified that I am speaking with the correct person using two identifiers.   I discussed the limitations of evaluation and management by telemedicine and the availability of in person appointments. The patient expressed understanding and agreed to proceed.  Verbal consent to proceed obtained.  History of Present Illness: Ana Knapp is a 41 y.o. female referred by Dr. Steele Sizer, MD  for consultation & management of constipation and GERD.  The patient reports that she needs a refill of her Nexium which she takes 40 mg twice a day.  The patient also reports that she has had constipation with a bowel movement once every week on average.  She is being seen for pelvic floor dysfunction and was recommended to take Colace.  The patient reports that she has a lot of abdominal cramps when she has to have a bowel movement.  There is no report of any unexplained weight loss.  The patient also denies any black stools or bloody stools.  She reports that she had a colonoscopy by me in 2019.  At that time the patient also had an upper endoscopy  She had been tried on Linzess in the past at 145 mcg but states that that caused her to have diarrhea.  The patient says that her insurance does not cover the [place].  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[facility]    PONV (postoperative nausea and vomiting)    Pulmonary embolism [facility])     Past Surgical History:  Procedure Laterality Date   ANGIOPLASTY / STENTING FEMORAL  March 2017   CHOLECYSTECTOMY     COLONOSCOPY WITH PROPOFOL N/A 01/02/2018   Procedure: COLONOSCOPY WITH PROPOFOL;  Surgeon: Ana Lame, MD;  Location: [facility] ENDOSCOPY;  Service: Endoscopy;  Laterality: N/A;   ESOPHAGOGASTRODUODENOSCOPY (EGD) WITH PROPOFOL N/A 01/02/2018   Procedure: ESOPHAGOGASTRODUODENOSCOPY (EGD) WITH PROPOFOL;  Surgeon: Ana Lame, MD;  Location[facility] ENDOSCOPY;  Service: Endoscopy;  Laterality: N/A;   LAPAROSCOPY N/A 02/26/2018   Procedure: LAPAROSCOPY DIAGNOSTIC WITH BIOPSIES;  Surgeon: Rubie Maid, MD;  Location: [facility] ORS;  Service: Gynecology;  Laterality: N/A;   Lytics Catheter Placement  11/16/15   MELANOMA EXCISION  2018   Back   ROBOTIC ASSISTED LAPAROSCOPIC CHOLECYSTECTOMY-SINGLE SITE  12/29/2016    Prior to Admission medications   Medication Sig Start Date End Date Taking? Authorizing Provider  acetaminophen (TYLENOL) 325 MG tablet Take 650 mg by mouth every 6 (six) hours as needed.   Yes [provider]  aspirin 81 MG EC tablet Take 1 tablet by mouth daily.  12/28/15  Yes [provider]  benzonatate (TESSALON) 100 MG capsule Take 1 capsule (100 mg total) by mouth 3 (three) times daily as needed. 07/09/22  Yes Mar Daring, PA-C  brompheniramine-pseudoephedrine-DM 30-2-10 MG/5ML syrup Take 5 mLs by mouth 4 (four) times daily as needed. 07/09/22  Yes Mar Daring, PA-C  diclofenac Sodium (VOLTAREN) 1 % GEL Apply 4 g topically 4 (four) times daily. 01/31/22  Yes Bo Merino, FNP  esomeprazole (NEXIUM) 40 MG capsule Take 1 capsule (40 mg total) by mouth 2  (two) times daily before a meal. 08/09/22  Yes Ana Lame, MD  fluticasone (FLONASE) 50 MCG/ACT nasal spray Place 2 sprays into both nostrils daily. 02/26/21  Yes Sowles, Drue Stager, MD  levocetirizine (XYZAL) 5 MG tablet Take 1 tablet (5 mg total) by mouth every evening. 01/31/22  Yes Bo Merino, FNP  linaclotide [facility]) 72 MCG capsule Take 1 capsule (72 mcg total) by mouth daily before breakfast. 06/14/22  Yes Bo Merino, FNP  lubiprostone (AMITIZA) 24 MCG capsule Take 1 capsule (24 mcg total) by mouth 2 (two) times daily with a meal. 08/09/22  Yes Tristyn Demarest, MD  montelukast (SINGULAIR) 10 MG tablet TAKE 1 TABLET(10 MG) BY MOUTH AT BEDTIME 02/21/22  Yes Sowles, Drue Stager, MD  Multiple Vitamin (MULTIVITAMIN) tablet Take 1 tablet by mouth daily.   Yes [provider]  ondansetron (ZOFRAN) 4 MG tablet TAKE 1 TABLET(4 MG) BY MOUTH EVERY 8 HOURS AS NEEDED FOR NAUSEA OR VOMITING 06/20/22  Yes Bo Merino, FNP  polyethylene glycol powder (GLYCOLAX/MIRALAX) 17 GM/SCOOP powder Take 17 g by mouth 2 (two) times daily as needed for moderate constipation. 07/06/21  Yes Harvest Dark, MD  Sod Fluoride-Potassium Nitrate (PREVIDENT 5000 SENSITIVE) 1.1-5 % PSTE  11/17/19  Yes [provider]    Family History  Problem Relation Age of Onset   Thyroid disease Mother    Diabetes Mother    Cancer Father        bladder   Bladder Cancer Father    Hypertension Maternal Grandmother    Hypothyroidism Maternal Grandmother    Thyroid disease Paternal Grandmother    Diabetes Paternal Grandmother    Stroke Paternal Grandmother    Cancer Paternal Grandfather        skin   Heart disease Neg Hx    COPD Neg Hx      Social History   Tobacco Use   Smoking status: Never   Smokeless tobacco: Never  Vaping Use   Vaping Use: Never used  Substance Use Topics   Alcohol use: Not Currently    Comment: rare   Drug use: No    Allergies as of 08/09/2022 - Review Complete 08/09/2022   Allergen Reaction Noted   Ortho tri-cyclen [norgestimate-eth estradiol] Other (See Comments) 11/30/2015   Prednisone Anaphylaxis 11/12/2015   Augmentin [amoxicillin-pot clavulanate] Other (See Comments) 08/26/2015   Doxycycline  09/13/2021    Review of Systems:    All systems reviewed and negative except where noted in HPI.   Observations/Objective:  Labs: CBC    Component Value Date/Time   WBC 8.5 01/31/2022 1425   RBC 4.49 01/31/2022 1425   HGB 13.0 01/31/2022 1425   HGB 11.1 11/30/2015 1629   HCT 39.4 01/31/2022 1425   HCT 34.6 11/30/2015 1629   PLT 216 01/31/2022 1425   PLT 452 (H) 11/30/2015 1629   MCV 87.8 01/31/2022 1425   MCV 84 11/30/2015 1629   MCH 29.0 01/31/2022 1425  MCHC 33.0 01/31/2022 1425   RDW 11.9 01/31/2022 1425   RDW 13.3 11/30/2015 1629   LYMPHSABS 1,224 01/31/2022 1425   LYMPHSABS 2.8 11/30/2015 1629   MONOABS 0.5 12/21/2017 0315   EOSABS 374 01/31/2022 1425   EOSABS 0.5 (H) 11/30/2015 1629   BASOSABS 51 01/31/2022 1425   BASOSABS 0.1 11/30/2015 1629   CMP     Component Value Date/Time   NA 140 01/31/2022 1425   NA 139 11/30/2015 1629   K 4.2 01/31/2022 1425   CL 105 01/31/2022 1425   CO2 27 01/31/2022 1425   GLUCOSE 74 01/31/2022 1425   BUN 14 01/31/2022 1425   BUN 13 11/30/2015 1629   CREATININE 0.79 01/31/2022 1425   CALCIUM 9.6 01/31/2022 1425   PROT 7.5 01/31/2022 1425   PROT 7.0 11/30/2015 1629   ALBUMIN 4.5 07/06/2021 1405   ALBUMIN 4.2 11/30/2015 1629   AST 16 01/31/2022 1425   ALT 11 01/31/2022 1425   ALKPHOS 28 (L) 07/06/2021 1405   BILITOT 0.5 01/31/2022 1425   BILITOT 0.3 11/30/2015 1629   GFRNONAA >60 07/06/2021 1405   GFRNONAA 86 01/27/2021 1509   GFRAA 99 01/27/2021 1509    Imaging Studies: No results found.  Assessment and Plan:   Ana Knapp is a 41 y.o. y/o female has been referred for refill of her medication and constipation.  The patient will be given a refill prescription for her Nexium.   The patient will also be started on Amitiza since this is what is covered on her insurance.  She will start on 24 mcg twice a day.  She has also been told to add MiraLAX if the Amitiza alone is not helping her symptoms.  The patient has been explained the plan and agrees with it.  Follow Up Instructions:  I discussed the assessment and treatment plan with the patient. The patient was provided an opportunity to ask questions and all were answered. The patient agreed with the plan and demonstrated an understanding of the instructions.   The patient was advised to call back or seek an in-person evaluation if the symptoms worsen or if the condition fails to improve as anticipated.  I provided 25 minutes of non-face-to-face time during this encounter.   Ana Lame, MD  Speech recognition software was used to dictate the above note.

## 2022-08-11 ENCOUNTER — Ambulatory Visit: Payer: 59

## 2022-08-12 ENCOUNTER — Encounter: Payer: Self-pay | Admitting: Gastroenterology

## 2022-08-16 ENCOUNTER — Telehealth: Payer: 59 | Admitting: Gastroenterology

## 2022-08-16 ENCOUNTER — Ambulatory Visit: Payer: 59

## 2022-08-16 ENCOUNTER — Telehealth: Payer: Self-pay

## 2022-08-16 NOTE — Telephone Encounter (Signed)
PA was completed via [email or web address] and I received an approval notification  I called the pharmacy to confirm and sent msg to pt notifying her approval

## 2022-08-17 ENCOUNTER — Ambulatory Visit: Payer: 59

## 2022-08-17 DIAGNOSIS — R278 Other lack of coordination: Secondary | ICD-10-CM

## 2022-08-17 DIAGNOSIS — M6289 Other specified disorders of muscle: Secondary | ICD-10-CM | POA: Diagnosis not present

## 2022-08-17 DIAGNOSIS — R102 Pelvic and perineal pain: Secondary | ICD-10-CM | POA: Diagnosis not present

## 2022-08-17 NOTE — Therapy (Signed)
OUTPATIENT PHYSICAL THERAPY FEMALE PELVIC TREATMENT   Patient Name: Ana Knapp MRN: [id number] DOB:04-02-1981, 41 y.o., female Today's Date: 08/17/2022   PT End of Session - 08/17/22 1624     Visit Number 6    Number of Visits 10    Date for PT Re-Evaluation 09/12/22    Authorization Type IE: 07/04/22    PT Start Time 1625    PT Stop Time 1710    PT Time Calculation (min) 45 min    Activity Tolerance Patient tolerated treatment well             Past Medical History:  Diagnosis Date   Chronic sinusitis    Fibromyalgia    GERD (gastroesophageal reflux disease)    Headache    sinus   History of melanoma 11/22/2017   History of melanoma 2018   Back   May-Thurner syndrome 11/30/2015   January 2017; 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[facility]    PONV (postoperative nausea and vomiting)    Pulmonary embolism [facility])    Past Surgical History:  Procedure Laterality Date   ANGIOPLASTY / STENTING FEMORAL  March 2017   CHOLECYSTECTOMY     COLONOSCOPY WITH PROPOFOL N/A 01/02/2018   Procedure: COLONOSCOPY WITH PROPOFOL;  Surgeon: Lucilla Lame, MD;  Location: [facility] ENDOSCOPY;  Service: Endoscopy;  Laterality: N/A;   ESOPHAGOGASTRODUODENOSCOPY (EGD) WITH PROPOFOL N/A 01/02/2018   Procedure: ESOPHAGOGASTRODUODENOSCOPY (EGD) WITH PROPOFOL;  Surgeon: Lucilla Lame, MD;  Location: [facility] ENDOSCOPY;  Service: Endoscopy;  Laterality: N/A;   LAPAROSCOPY N/A 02/26/2018   Procedure: LAPAROSCOPY DIAGNOSTIC WITH BIOPSIES;  Surgeon: Rubie Maid, MD;  Location: [facility] ORS;  Service: Gynecology;  Laterality: N/A;   Lytics Catheter Placement  11/16/15   MELANOMA EXCISION  2018   Back   ROBOTIC ASSISTED LAPAROSCOPIC CHOLECYSTECTOMY-SINGLE SITE  12/29/2016   Patient Active Problem List   Diagnosis Date Noted   Pelvic pain 05/10/2022   Endometriosis 03/01/2018   Abnormal CT scan    History of melanoma 11/22/2017   Biliary dyskinesia 12/05/2016   Constipation 08/18/2016   May-Thurner syndrome 11/30/2015   DVT  (deep venous thrombosis) ([place]) 11/13/2015   History of pulmonary embolism 11/13/2015   GERD (gastroesophageal reflux disease) 09/28/2015   Flushing 09/24/2015   Fibromyalgia 08/30/2015   Seasonal allergic rhinitis 08/30/2015   Fibrositis 08/30/2015    PCP: Steele Sizer, MD  REFERRING PROVIDER: Philip Aspen, CNM   REFERRING DIAG:  R10.2 (ICD-10-CM) - Pelvic pain   THERAPY DIAG:  Pelvic pain  Pelvic floor dysfunction  Other lack of coordination  Rationale for Evaluation and Treatment: Rehabilitation  ONSET DATE: A few years ago  PRECAUTIONS: None  WEIGHT BEARING RESTRICTIONS: No  FALLS:  Has patient fallen in last 6 months? 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 Pt feels like the pain is becoming more constant and on recent pelvic u/s were normal. Pt has been having "horrible" menstrual cycles especially the last 2 times. Pt can see blood clots in the toilet. B/c of the blood clots Pt is not able to take any type of birth control. Pt has to use overnight pads during menstrual cycle. Pt notices more pain when she is sitting down at rest.   Pain location: Deep and Right  Pain type: aching and sharp Pain description: intermittent, constant, sharp, and stabbing   Aggravating factors: period, stress, sitting longer periods of time  Relieving factors: some physical activity    PATIENT GOALS: Pt would like to decrease pain and to get back to normal BMs   UROLOGICAL HISTORY Pt has no concerns  Fluid intake:   Pain with urination:  Fully empty bladder:  Stream:  Urgency: No Frequency: 6-7x Nocturia:  0x   GASTROINTESTINAL HISTORY Pain with bowel movement: Yes Type of bowel movement:Type (Bristol Stool Scale) 6 and Strain Yes Frequency: every 1-5x/day with taking Miralax 2x per day and  Fully empty rectum: No  Leakage: No    SEXUAL HISTORY/FUNCTION Pain with intercourse: Initial Penetration, During Penetration, During Climax, and Pain Interrupts Intercourse Ability to have vaginal penetration:  No; Deep thrusting: No Able to achieve orgasm?: Yes, but pain   OBSTETRICAL HISTORY Vaginal deliveries: G0P0   GYNECOLOGICAL HISTORY Hysterectomy: no Pelvic Organ Prolapse: None Pain with exam: yes  Heaviness/pressure: no   SUBJECTIVE:  Pt has been having some external stressors and feels like her stress contributes to constipation. Pt is only using Miralax once a day. Pt had a BM last night but before had been 4 days.    PAIN:  Are you having pain? no NPRS scale:     OBJECTIVE:   DIAGNOSTIC TESTING/IMPRESSIONS:  05/16/22 U/S results:  Indications:Pelvic Pain, h/o Endometriosis  Impression: 1. Single Intramural fibroid, Right lateral fundus  COGNITION: Overall cognitive status: Within functional limits for tasks assessed     POSTURE:   Iliac crest height: L iliac crest, L shoulder elevated, B rounding of shoulders in sitting  Pelvic obliquity: WNL   RANGE OF MOTION:    (Norm range in degrees)  LEFT 07/13/22 RIGHT 07/13/22  Lumbar forward flexion (65):  WNL    Lumbar extension (30): WNL    Lumbar lateral flexion (25):  WNL WNL   Thoracic and Lumbar rotation (30 degrees):    WNL WNL   Hip Flexion (0-125):   WNL WNL  Hip IR (0-45):  Slight restriction WNL  Hip ER (0-45):  WNL WNL  Hip Adduction:      Hip Abduction (0-40):  WNL WNL  Hip extension (0-15):     (*= pain, Blank rows = not tested)    STRENGTH: MMT   RLE 07/13/22 LLE 07/13/22  Hip Flexion 5 5  Hip Extension 4 4  Hip Abduction     Hip Adduction     Hip ER  5 5  Hip IR  5 5  Knee Extension  5 5  Knee Flexion 5 5  Dorsiflexion     Plantarflexion (seated) 5 5  (*= pain, Blank rows =  not tested)   SPECIAL TESTS:   FABER (SN 81): negative B FADIR (SN 94): negative B   PALPATION:  Abdominal:  Diastasis: none Scar mobility: R and L of umbilicus from stent placement   -Significant tension throughout abdomen particularly RLQ and by umbilicus, increased pain at RLQ with noticeable Valsalva maneuver   EXTERNAL PELVIC EXAM: Patient educated on the purpose of the pelvic exam and articulated understanding; patient consented to the exam verbally.  Breath coordination: present Voluntary Contraction: present, 2/5 MMT Relaxation: full Perineal movement with sustained IAP increase ("bear down"): elevation, when asked again - no change Perineal movement with rapid IAP increase ("cough"): no change (0= no contraction, 1= flicker, 2= weak squeeze, 3= fair squeeze with lift, 4= good squeeze and lift against resistance, 5= strong squeeze against strong resistance)   TODAY'S TREATMENT:  Neuromuscular Re-education: Discussion on visit with GI and what GI prescribed (Miralax every other one or 2x per day).   Discussion on performing internal PFM assessment and how that can aid in providing further information on irregularity of BMs and pain in the abdomen. DPT verbalized assessment thoroughly with visual aid. Pt verbalized understanding and with some hesitancy due to pain with GYN exams.  Demonstration via video of pelvic floor and diaphragm connection. Pt verbalized understanding.   Supine hooklying diaphragmatic breathing with VCs and TCs for downregulation of the nervous system and improved management of IAP  Supine hooklying PFM lengthening techniques/pain modulation with diaphragmatic breathing, VCs and TCs as needed              Child's pose with IR component  "Happy baby" pose   Piriformis pose (supine and seated)   Patient response to interventions: Pt felt more muscular  tension on the R side gluteal and enjoyed happy baby    Patient Education:  Patient provided with HEP including: PFM lengthening/pain modulation above. Patient educated throughout session on appropriate technique and form using multi-modal cueing, HEP, and activity modification. Patient will benefit from further education in order to maximize compliance and understanding for long-term therapeutic gains.    ASSESSMENT:  Clinical Impression: Patient presents to clinic with excellent motivation to participate in today's session. Pt continues to demonstrate deficits in ROM, PFM coordination, PFM extensibility, posture, scar mobility, and pain. Pt with no pain today and at recent GI visit, doctor advised Pt to continue with Miralax (2x daily). Pt continues to have extreme pain (10/10) in the abdomen when unable to have a BM over 2-3 days. Pt continues to perform HEP and breathing techniques. Lengthy discussion and demonstration via pelvic model on a PFM internal assessment and how the exam can gather new information about the 3rd layer of the pelvic floor. Pt verbalized understanding and will think about it for next session. Pt required moderate VCs and TCs during PFM lengthening techniques for proper posture. Pt responded positively to active and educational interventions. Patient will continue to benefit from skilled therapeutic intervention to address deficits in ROM, PFM coordination, PFM extensibility, posture, scar mobility, and pain in order to increase PLOF and improve overall QOL.    Objective Impairments: decreased coordination, hypomobility, increased muscle spasms, improper body mechanics, postural dysfunction, and pain.   Activity Limitations: sitting, standing, and toileting  Personal Factors: Age, Past/current experiences, Time since onset of injury/illness/exacerbation, and 3+ comorbidities: fibromyalgia, hx of endometriosis, hx of DVT/PE  are also affecting patient's functional outcome.    Rehab Potential: Good  Clinical Decision Making: Evolving/moderate complexity  Evaluation Complexity: Moderate  GOALS: Goals reviewed with patient? Yes  SHORT TERM GOALS: Target date: 08/08/2022  Patient will demonstrate independence with HEP in order to maximize therapeutic gains and improve carryover from physical therapy sessions to ADLs in the home and community. Baseline: toileting posture handout Goal status: INITIAL    LONG TERM GOALS: Target date: 09/12/2022   Patient will score >/= 54 on FOTO Bowel Constipation  in order to demonstrate decreased pain, improved PFM coordination, improved PFM extensibility and overall QOL.  Baseline: 41 Goal status: INITIAL  2.  Patient will demonstrate coordinated lengthening and relaxation of PFM with diaphragmatic inhalation in order to decrease spasm and allow for unrestricted elimination of urine/feces for improved overall QOL. Baseline: hx of significant constipation  Goal status: INITIAL  3.  Patient will decrease worst pain as reported on NPRS by at least 2 points to demonstrate clinically significant reduction in pain in order to restore/improve function and overall QOL. Baseline: 10/10 pelvic pain  Goal status: INITIAL  4.  Patient will report being able to return to activities including, but not limited to: penetrative sex, sitting for long periods of time, physical activity, and annual GYN exams without pain or limitation to indicate complete resolution of the chief concern and return to prior level of participation at home and in the community. Baseline: inability to have penetrative sex, increased pain with the exams and sitting Goal status: INITIAL  5.  Patient will demonstrate understanding of basic self-management/down-regulation of the nervous system for persistent pain condition and stress as evidenced by diaphragmatic breathing without cueing and body scan/progressive relaxation meditation in order to transition to  independent management of patient's chief concern: pelvic pain. Baseline: will continue to assess in further sessions Goal status: INITIAL    PLAN: PT Frequency: 1x/week  PT Duration: 10 weeks  Planned Interventions: Therapeutic exercises, Therapeutic activity, Neuromuscular re-education, Balance training, Gait training, Patient/Family education, Self Care, Joint mobilization, Spinal mobilization, Cryotherapy, Moist heat, scar mobilization, Taping, and Manual therapy  Plan For Next Session:  internal or continue PFM lengthening techniques, posture in sitting strength, body relax track, more manual at [place], PT, DPT  08/17/2022, 5:14 PM

## 2022-08-24 ENCOUNTER — Ambulatory Visit: Payer: 59 | Attending: Certified Nurse Midwife

## 2022-08-24 DIAGNOSIS — R102 Pelvic and perineal pain: Secondary | ICD-10-CM | POA: Insufficient documentation

## 2022-08-24 DIAGNOSIS — M6289 Other specified disorders of muscle: Secondary | ICD-10-CM | POA: Diagnosis not present

## 2022-08-24 DIAGNOSIS — R278 Other lack of coordination: Secondary | ICD-10-CM | POA: Diagnosis not present

## 2022-08-24 NOTE — Therapy (Signed)
OUTPATIENT PHYSICAL THERAPY FEMALE PELVIC TREATMENT   Patient Name: Ana Knapp MRN: [id number] DOB:10-Jul-1981, 41 y.o., female Today's Date: 08/24/2022   PT End of Session - 08/24/22 0929     Visit Number 7    Number of Visits 10    Date for PT Re-Evaluation 09/12/22    Authorization Type IE: 07/04/22    PT Start Time 0934    PT Stop Time 1014    PT Time Calculation (min) 40 min    Activity Tolerance Patient tolerated treatment well             Past Medical History:  Diagnosis Date   Chronic sinusitis    Fibromyalgia    GERD (gastroesophageal reflux disease)    Headache    sinus   History of melanoma 11/22/2017   History of melanoma 2018   Back   May-Thurner syndrome 11/30/2015   January 2017; 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[facility]    PONV (postoperative nausea and vomiting)    Pulmonary embolism [facility])    Past Surgical History:  Procedure Laterality Date   ANGIOPLASTY / STENTING FEMORAL  March 2017   CHOLECYSTECTOMY     COLONOSCOPY WITH PROPOFOL N/A 01/02/2018   Procedure: COLONOSCOPY WITH PROPOFOL;  Surgeon: Lucilla Lame, MD;  Location: [facility] ENDOSCOPY;  Service: Endoscopy;  Laterality: N/A;   ESOPHAGOGASTRODUODENOSCOPY (EGD) WITH PROPOFOL N/A 01/02/2018   Procedure: ESOPHAGOGASTRODUODENOSCOPY (EGD) WITH PROPOFOL;  Surgeon: Lucilla Lame, MD;  Location: [facility] ENDOSCOPY;  Service: Endoscopy;  Laterality: N/A;   LAPAROSCOPY N/A 02/26/2018   Procedure: LAPAROSCOPY DIAGNOSTIC WITH BIOPSIES;  Surgeon: Rubie Maid, MD;  Location: [facility] ORS;  Service: Gynecology;  Laterality: N/A;   Lytics Catheter Placement  11/16/15   MELANOMA EXCISION  2018   Back   ROBOTIC ASSISTED LAPAROSCOPIC CHOLECYSTECTOMY-SINGLE SITE  12/29/2016   Patient Active Problem List   Diagnosis Date Noted   Pelvic pain 05/10/2022   Endometriosis 03/01/2018   Abnormal CT scan    History of melanoma 11/22/2017   Biliary dyskinesia 12/05/2016   Constipation 08/18/2016   May-Thurner syndrome 11/30/2015   DVT  (deep venous thrombosis) ([place]) 11/13/2015   History of pulmonary embolism 11/13/2015   GERD (gastroesophageal reflux disease) 09/28/2015   Flushing 09/24/2015   Fibromyalgia 08/30/2015   Seasonal allergic rhinitis 08/30/2015   Fibrositis 08/30/2015    PCP: Steele Sizer, MD  REFERRING PROVIDER: Philip Aspen, CNM   REFERRING DIAG:  R10.2 (ICD-10-CM) - Pelvic pain   THERAPY DIAG:  Pelvic pain  Pelvic floor dysfunction  Other lack of coordination  Rationale for Evaluation and Treatment: Rehabilitation  ONSET DATE: A few years ago  PRECAUTIONS: None  WEIGHT BEARING RESTRICTIONS: No  FALLS:  Has patient fallen in last 6 months? 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 Pt feels like the pain is becoming more constant and on recent pelvic u/s were normal. Pt has been having "horrible" menstrual cycles especially the last 2 times. Pt can see blood clots in the toilet. B/c of the blood clots Pt is not able to take any type of birth control. Pt has to use overnight pads during menstrual cycle. Pt notices more pain when she is sitting down at rest.   Pain location: Deep and Right  Pain type: aching and sharp Pain description: intermittent, constant, sharp, and stabbing   Aggravating factors: period, stress, sitting longer periods of time  Relieving factors: some physical activity    PATIENT GOALS: Pt would like to decrease pain and to get back to normal BMs   UROLOGICAL HISTORY Pt has no concerns  Fluid intake:   Pain with urination:  Fully empty bladder:  Stream:  Urgency: No Frequency: 6-7x Nocturia:  0x   GASTROINTESTINAL HISTORY Pain with bowel movement: Yes Type of bowel movement:Type (Bristol Stool Scale) 6 and Strain Yes Frequency: every 1-5x/day with taking Miralax 2x per day and  Fully empty rectum: No  Leakage: No    SEXUAL HISTORY/FUNCTION Pain with intercourse: Initial Penetration, During Penetration, During Climax, and Pain Interrupts Intercourse Ability to have vaginal penetration:  No; Deep thrusting: No Able to achieve orgasm?: Yes, but pain   OBSTETRICAL HISTORY Vaginal deliveries: G0P0   GYNECOLOGICAL HISTORY Hysterectomy: no Pelvic Organ Prolapse: None Pain with exam: yes  Heaviness/pressure: no   SUBJECTIVE:  Pt has been able to have 2 BMs since Monday and no abdominal pain. However, Pt is having lower pain in the pelvis which feels like "my ovary is twisting." Pt has been able to use HEP to aid in decreasing the pain but didn't go away fully.    PAIN:  Are you having pain? no NPRS scale: 8/10, R sided lower abdominals     OBJECTIVE:   DIAGNOSTIC TESTING/IMPRESSIONS:  05/16/22 U/S results:  Indications:Pelvic Pain, h/o Endometriosis  Impression: 1. Single Intramural fibroid, Right lateral fundus  COGNITION: Overall cognitive status: Within functional limits for tasks assessed     POSTURE:   Iliac crest height: L iliac crest, L shoulder elevated, B rounding of shoulders in sitting  Pelvic obliquity: WNL   RANGE OF MOTION:    (Norm range in degrees)  LEFT 07/13/22 RIGHT 07/13/22  Lumbar forward flexion (65):  WNL    Lumbar extension (30): WNL    Lumbar lateral flexion (25):  WNL WNL   Thoracic and Lumbar rotation (30 degrees):    WNL WNL   Hip Flexion (0-125):   WNL WNL  Hip IR (0-45):  Slight restriction WNL  Hip ER (0-45):  WNL WNL  Hip Adduction:      Hip Abduction (0-40):  WNL WNL  Hip extension (0-15):     (*= pain, Blank rows = not tested)    STRENGTH: MMT   RLE 07/13/22 LLE 07/13/22  Hip Flexion 5 5  Hip Extension 4 4   Hip Abduction     Hip Adduction     Hip ER  5 5  Hip IR  5 5  Knee Extension 5 5  Knee  Flexion 5 5  Dorsiflexion     Plantarflexion (seated) 5 5  (*= pain, Blank rows = not tested)   SPECIAL TESTS:   FABER (SN 81): negative B FADIR (SN 94): negative B   PALPATION:  Abdominal:  Diastasis: none Scar mobility: R and L of umbilicus from stent placement   -Significant tension throughout abdomen particularly RLQ and by umbilicus, increased pain at RLQ with noticeable Valsalva maneuver   EXTERNAL PELVIC EXAM: Patient educated on the purpose of the pelvic exam and articulated understanding; patient consented to the exam verbally.  Breath coordination: present Voluntary Contraction: present, 2/5 MMT Relaxation: full Perineal movement with sustained IAP increase ("bear down"): elevation, when asked again - no change Perineal movement with rapid IAP increase ("cough"): no change (0= no contraction, 1= flicker, 2= weak squeeze, 3= fair squeeze with lift, 4= good squeeze and lift against resistance, 5= strong squeeze against strong resistance)   TODAY'S TREATMENT:  Manual Therapy: Abdominal myofascial release for improved fascial sling mobility and pain modulation   STM of surrounding musculature and ligaments surrounding the coccyx with hip IR movement and prone diaphragmatic breathing  Pt with more tenderness on L> R   Neuromuscular Re-education: Pre-treatment assessment: In prone -Unable to assess PPVIMs at the SIJ in prone due to increased pain upon palpation  -Assessed L1-L5 with PPVIMs but unable to proceed with Grade I mobs due to increased pain  -Significant muscular tension around lumbar paraspinals and significant pain upon palpation  Supine hooklying diaphragmatic breathing with VCs and TCs for downregulation of the nervous system and improved management of IAP  Discussion on posture at the pelvis in sitting (anterior tilt vs posterior tilt) as Pt with increased pain  at the buttock/coccyx on firmer surfaces. Pt verbalized understanding and practiced in sitting.   Patient response to interventions: Pt ended session with 6/10 pain in the RLQ   Patient Education:  Patient provided with HEP including: no change. Patient educated throughout session on appropriate technique and form using multi-modal cueing, HEP, and activity modification. Patient will benefit from further education in order to maximize compliance and understanding for long-term therapeutic gains.    ASSESSMENT:  Clinical Impression: Patient presents to clinic with excellent motivation to participate in today's session. Pt continues to demonstrate deficits in ROM, PFM coordination, PFM extensibility, posture, scar mobility, and pain. Pt with increased RLQ pain 8/10 but believes it is more post-menstrual cycle related as this has happened in the past. Pt has tried heat modality and PFM relaxation techniques as well as breathing, but pain does not completely alleviate. Upon assessment of the SIJ and lumbar spine, Pt unable to tolerate PPVIMs at the SIJ due to intense pain as well as the lumbar spine. Musculature around the SIJ (gluteals) and ligamentous structures as well as lumbar paraspinals with moderate tension but increased pain upon palpation. Discussion on connection of the spine and how muscular tension can contribute to PFM tension. Pt verbalized understanding. After manual techniques both around the SIJ and abdomen, Pt ended session with 6/10 pain. Pt responded well to manual, active, and educational interventions. Patient will continue to benefit from skilled therapeutic intervention to address deficits in ROM, PFM coordination, PFM extensibility, posture, scar mobility, and pain in order to increase PLOF and improve overall QOL.    Objective Impairments: decreased coordination, hypomobility, increased muscle spasms, improper body mechanics, postural dysfunction, and pain.   Activity  Limitations: sitting, standing, and toileting  Personal Factors: Age, Past/current experiences, Time since onset  of injury/illness/exacerbation, and 3+ comorbidities: fibromyalgia, hx of endometriosis, hx of DVT/PE  are also affecting patient's functional outcome.   Rehab Potential: Good  Clinical Decision Making: Evolving/moderate complexity  Evaluation Complexity: Moderate   GOALS: Goals reviewed with patient? Yes  SHORT TERM GOALS: Target date: 08/08/2022  Patient will demonstrate independence with HEP in order to maximize therapeutic gains and improve carryover from physical therapy sessions to ADLs in the home and community. Baseline: toileting posture handout Goal status: INITIAL    LONG TERM GOALS: Target date: 09/12/2022   Patient will score >/= 54 on FOTO Bowel Constipation  in order to demonstrate decreased pain, improved PFM coordination, improved PFM extensibility and overall QOL.  Baseline: 41 Goal status: INITIAL  2.  Patient will demonstrate coordinated lengthening and relaxation of PFM with diaphragmatic inhalation in order to decrease spasm and allow for unrestricted elimination of urine/feces for improved overall QOL. Baseline: hx of significant constipation  Goal status: INITIAL  3.  Patient will decrease worst pain as reported on NPRS by at least 2 points to demonstrate clinically significant reduction in pain in order to restore/improve function and overall QOL. Baseline: 10/10 pelvic pain  Goal status: INITIAL  4.  Patient will report being able to return to activities including, but not limited to: penetrative sex, sitting for long periods of time, physical activity, and annual GYN exams without pain or limitation to indicate complete resolution of the chief concern and return to prior level of participation at home and in the community. Baseline: inability to have penetrative sex, increased pain with the exams and sitting Goal status: INITIAL  5.   Patient will demonstrate understanding of basic self-management/down-regulation of the nervous system for persistent pain condition and stress as evidenced by diaphragmatic breathing without cueing and body scan/progressive relaxation meditation in order to transition to independent management of patient's chief concern: pelvic pain. Baseline: will continue to assess in further sessions Goal status: INITIAL    PLAN: PT Frequency: 1x/week  PT Duration: 10 weeks  Planned Interventions: Therapeutic exercises, Therapeutic activity, Neuromuscular re-education, Balance training, Gait training, Patient/Family education, Self Care, Joint mobilization, Spinal mobilization, Cryotherapy, Moist heat, scar mobilization, Taping, and Manual therapy  Plan For Next Session:   internal or continue PFM lengthening techniques, posture in sitting strength (ER with band, or lying with weights/band/cat cow/childs pose/LTR, pelvic tilts then manual at back?    [employer], PT, DPT  08/24/2022, 12:37 PM

## 2022-08-26 ENCOUNTER — Other Ambulatory Visit: Payer: Self-pay | Admitting: Gastroenterology

## 2022-08-30 ENCOUNTER — Telehealth: Payer: Self-pay

## 2022-08-30 NOTE — Telephone Encounter (Signed)
PA was submitted via [email or web address] and received an approval notice...  Called the pharmacy and they stated it went through for a cost of $10 for the pt

## 2022-09-19 ENCOUNTER — Ambulatory Visit: Payer: 59

## 2022-09-22 ENCOUNTER — Other Ambulatory Visit: Payer: Self-pay | Admitting: Gastroenterology

## 2022-09-22 ENCOUNTER — Ambulatory Visit: Payer: 59

## 2022-09-28 ENCOUNTER — Ambulatory Visit: Payer: 59 | Attending: Certified Nurse Midwife

## 2022-09-28 DIAGNOSIS — R102 Pelvic and perineal pain: Secondary | ICD-10-CM | POA: Insufficient documentation

## 2022-09-28 DIAGNOSIS — R278 Other lack of coordination: Secondary | ICD-10-CM | POA: Diagnosis not present

## 2022-09-28 DIAGNOSIS — M6289 Other specified disorders of muscle: Secondary | ICD-10-CM | POA: Diagnosis not present

## 2022-09-28 NOTE — Therapy (Signed)
OUTPATIENT PHYSICAL THERAPY FEMALE PELVIC RE-CERT   Patient Name: Ana Knapp MRN: [id number] DOB:07-24-1981, 41 y.o., female Today's Date: 09/28/2022   PT End of Session - 09/28/22 1316     Visit Number 8    Number of Visits 20    Date for PT Re-Evaluation 12/07/22    Authorization Type IE: 07/04/22; RC: 09/28/22    PT Start Time 1316    PT Stop Time 1356    PT Time Calculation (min) 40 min    Activity Tolerance Patient tolerated treatment well             Past Medical History:  Diagnosis Date   Chronic sinusitis    Fibromyalgia    GERD (gastroesophageal reflux disease)    Headache    sinus   History of melanoma 11/22/2017   History of melanoma 2018   Back   May-Thurner syndrome 11/30/2015   January 2017; 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[facility]    PONV (postoperative nausea and vomiting)    Pulmonary embolism [facility])    Past Surgical History:  Procedure Laterality Date   ANGIOPLASTY / STENTING FEMORAL  March 2017   CHOLECYSTECTOMY     COLONOSCOPY WITH PROPOFOL N/A 01/02/2018   Procedure: COLONOSCOPY WITH PROPOFOL;  Surgeon: Lucilla Lame, MD;  Location: [facility] ENDOSCOPY;  Service: Endoscopy;  Laterality: N/A;   ESOPHAGOGASTRODUODENOSCOPY (EGD) WITH PROPOFOL N/A 01/02/2018   Procedure: ESOPHAGOGASTRODUODENOSCOPY (EGD) WITH PROPOFOL;  Surgeon: Lucilla Lame, MD;  Location: [facility] ENDOSCOPY;  Service: Endoscopy;  Laterality: N/A;   LAPAROSCOPY N/A 02/26/2018   Procedure: LAPAROSCOPY DIAGNOSTIC WITH BIOPSIES;  Surgeon: Rubie Maid, MD;  Location: [facility] ORS;  Service: Gynecology;  Laterality: N/A;   Lytics Catheter Placement  11/16/15   MELANOMA EXCISION  2018   Back   ROBOTIC ASSISTED LAPAROSCOPIC CHOLECYSTECTOMY-SINGLE SITE  12/29/2016   Patient Active Problem List   Diagnosis Date Noted   Pelvic pain 05/10/2022   Endometriosis 03/01/2018   Abnormal CT scan    History of melanoma 11/22/2017   Biliary dyskinesia 12/05/2016   Constipation 08/18/2016   May-Thurner syndrome  11/30/2015   DVT (deep venous thrombosis) ([place]) 11/13/2015   History of pulmonary embolism 11/13/2015   GERD (gastroesophageal reflux disease) 09/28/2015   Flushing 09/24/2015   Fibromyalgia 08/30/2015   Seasonal allergic rhinitis 08/30/2015   Fibrositis 08/30/2015    PCP: Steele Sizer, MD  REFERRING PROVIDER: Philip Aspen, CNM   REFERRING DIAG:  R10.2 (ICD-10-CM) - Pelvic pain   THERAPY DIAG:  Pelvic pain  Pelvic floor dysfunction  Other lack of coordination  Rationale for Evaluation and Treatment: Rehabilitation  ONSET DATE: A few years ago  PRECAUTIONS: None  WEIGHT BEARING RESTRICTIONS: No  FALLS:  Has patient fallen in last 6 months? 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 Pt feels like the pain is becoming more constant and on recent pelvic u/s were normal. Pt has been having "horrible" menstrual cycles especially the last 2 times. Pt can see blood clots in the toilet. B/c of the blood clots Pt is not able to take any type of birth control. Pt has to use overnight pads during menstrual cycle. Pt notices more pain when she is sitting down at rest.   Pain location: Deep and Right  Pain type: aching and sharp Pain description: intermittent, constant, sharp, and stabbing   Aggravating factors: period, stress, sitting longer periods of time  Relieving factors: some physical activity    PATIENT GOALS: Pt would like to decrease pain and to get back to normal BMs   UROLOGICAL HISTORY Pt has no concerns  Fluid intake:   Pain with urination:  Fully empty bladder:  Stream:  Urgency: No Frequency: 6-7x Nocturia:  0x   GASTROINTESTINAL HISTORY Pain with bowel movement: Yes Type of bowel movement:Type (Bristol Stool Scale) 6 and Strain Yes Frequency: every 1-5x/day with taking Miralax 2x per day and  Fully empty rectum: No  Leakage: No    SEXUAL HISTORY/FUNCTION Pain with intercourse: Initial Penetration, During Penetration, During Climax, and Pain Interrupts Intercourse Ability to have vaginal penetration:  No; Deep thrusting: No Able to achieve orgasm?: Yes, but pain   OBSTETRICAL HISTORY Vaginal deliveries: G0P0   GYNECOLOGICAL HISTORY Hysterectomy: no Pelvic Organ Prolapse: None Pain with exam: yes  Heaviness/pressure: no   SUBJECTIVE:  Pt had last BM on Monday 09/26/22. Pt has been taking Miralax every other day but noticed it was not working as well as before. Dr gave Pt Amitiza capsule 2x daily and Pt feels like a BM is going to happen but feeling bloated today. Pt's last period was extremely intense and no PFM lengthening activities or downregulation of nervous system helped. Pt tried abdominal massage and continues to have pain at the R lower abdomen.    PAIN:  Are you having pain? Yes NPRS scale: 4/10, R lower abdominals     OBJECTIVE:   DIAGNOSTIC TESTING/IMPRESSIONS:  05/16/22 U/S results:  Indications:Pelvic Pain, h/o Endometriosis  Impression: 1. Single Intramural fibroid, Right lateral fundus  COGNITION: Overall cognitive status: Within functional limits for tasks assessed     POSTURE:   Iliac crest height: L iliac crest, L shoulder elevated, B rounding of shoulders in sitting  Pelvic obliquity: WNL   RANGE OF MOTION:    (Norm range in degrees)  LEFT 07/13/22 RIGHT 07/13/22  Lumbar forward flexion (65):  WNL    Lumbar extension (30): WNL    Lumbar lateral flexion (25):  WNL WNL   Thoracic and Lumbar rotation (30 degrees):    WNL WNL   Hip Flexion (0-125):   WNL WNL  Hip IR (0-45):  Slight restriction WNL  Hip ER (0-45):  WNL WNL  Hip Adduction:       Hip Abduction (0-40):  WNL WNL  Hip extension (0-15):     (*= pain, Blank rows = not tested)    STRENGTH: MMT   RLE 07/13/22 LLE 07/13/22  Hip Flexion 5 5  Hip Extension 4 4  Hip Abduction  Hip Adduction     Hip ER  5 5  Hip IR  5 5  Knee Extension 5 5  Knee Flexion 5 5  Dorsiflexion     Plantarflexion (seated) 5 5  (*= pain, Blank rows = not tested)   SPECIAL TESTS:   FABER (SN 81): negative B FADIR (SN 94): negative B   PALPATION:  Abdominal:  Diastasis: none Scar mobility: R and L of umbilicus from stent placement   -Significant tension throughout abdomen particularly RLQ and by umbilicus, increased pain at RLQ with noticeable Valsalva maneuver   EXTERNAL PELVIC EXAM: Patient educated on the purpose of the pelvic exam and articulated understanding; patient consented to the exam verbally.  Breath coordination: present Voluntary Contraction: present, 2/5 MMT Relaxation: full Perineal movement with sustained IAP increase ("bear down"): elevation, when asked again - no change Perineal movement with rapid IAP increase ("cough"): no change (0= no contraction, 1= flicker, 2= weak squeeze, 3= fair squeeze with lift, 4= good squeeze and lift against resistance, 5= strong squeeze against strong resistance)   TODAY'S TREATMENT:  Neuromuscular Re-education: Reassessment of FOTO  FOTO Bowel Constipation - IE: 41, Today: 47  Discussion on goals below. See below. Pt has met 2/6 goals.   Discussion on seeking another follow-up with referring provider in relation to last few menstrual cycles that were intolerable and Pt had heavy bleeding. Pt also reports increased feeling of weakness during the last menstrual cycle and significant amounts of clots were seen. Pt verbalized understanding.   Discussion on an internal pelvic floor exam and how that information could be helpful in relation to pelvic pain and bowel constipation dysfunction. Pt verbalized understanding and is  comfortable to perform next session.   Patient response to interventions: Pt comfortable to continue for a few visits than switching to another PFPT provider    Patient Education:  Patient provided with HEP including: no change. Patient educated throughout session on appropriate technique and form using multi-modal cueing, HEP, and activity modification. Patient will benefit from further education in order to maximize compliance and understanding for long-term therapeutic gains.    ASSESSMENT:  Clinical Impression: Patient presents to clinic with excellent motivation to participate in today's re-evaluation. Pt has demonstrated minimal-moderate improvement since IE on 07/04/22. In relation to FOTO Bowel Constipation, Pt has improved by 6 points (47) but has not met goal of 54. Pt has not yet found a good regime to help with constipation. Pt, at first, was taking Miralax every two days then GI provider gave every other day with Amitiza capsule addition. Pt just added Amitiza capsule this past week. Pt's worst pelvic pain has reduced significantly since IE 10/10 (NPRS) to 5/10 and is able to sit for long periods of time and be involved in physical activity without limitation. Pt continues to not engage in penetrative sex. Although, Pt has demonstrated some improvement over the past few weeks, Pt continues to have significant R lower abdominal pain especially when constipated (8/10) and during the last two menstrual cycles (10/10). Pt also continues to demonstrate increased B rounded shoulders and forward flexion despite significant postural education. Pt reports increased bleeding and increased weakness due to "large" clots being passed through. DPT advised Pt to follow-up with referring provider (GYN) in relation to heavy menstrual cycles and pain as last vaginal ultrasound in July was normal. Pt verbalized understanding. Discussion on pelvic floor internal exam and manual techniques intravaginally. Upon  consent next session, Pt is willing to participate in  internal PFM exam. Pt will continue to to benefit from skilled therapeutic intervention to address deficits in ROM, PFM coordination, PFM extensibility, posture, scar mobility, and pain in order to increase PLOF and improve overall QOL.    Objective Impairments: decreased coordination, hypomobility, increased muscle spasms, improper body mechanics, postural dysfunction, and pain.   Activity Limitations: sitting, standing, and toileting  Personal Factors: Age, Past/current experiences, Time since onset of injury/illness/exacerbation, and 3+ comorbidities: fibromyalgia, hx of endometriosis, hx of DVT/PE  are also affecting patient's functional outcome.   Rehab Potential: Good  Clinical Decision Making: Evolving/moderate complexity  Evaluation Complexity: Moderate   GOALS: Goals reviewed with patient? Yes  SHORT TERM GOALS: Target date: 08/08/2022  Patient will demonstrate independence with HEP in order to maximize therapeutic gains and improve carryover from physical therapy sessions to ADLs in the home and community. Baseline: toileting posture handout; (12/6): Pt is IND with HEP Goal status: MET    LONG TERM GOALS: Target date: 12/07/22  Patient will score >/= 54 on FOTO Bowel Constipation  in order to demonstrate decreased pain, improved PFM coordination, improved PFM extensibility and overall QOL.  Baseline: 41; (12/6): 47 Goal status: IN PROGRESS  2.  Patient will demonstrate coordinated lengthening and relaxation of PFM with diaphragmatic inhalation in order to decrease spasm and allow for unrestricted elimination of urine/feces for improved overall QOL. Baseline: hx of significant constipation; (12/6): Pt continues to have constipation but Miralax has helped and Amitiza Goal status: IN PROGRESS  3.  Patient will decrease worst pain as reported on NPRS by at least 2 points to demonstrate clinically significant reduction in  pain in order to restore/improve function and overall QOL. Baseline: 10/10 pelvic pain; (12/6): 5/10 mostly due to increased feeling of bloating/unstable to use bathroom but increased pain with last few menstrual cycles Goal status: MET  4.  Patient will report being able to return to activities including, but not limited to: penetrative sex, sitting for long periods of time, physical activity, and annual GYN exams without pain or limitation to indicate complete resolution of the chief concern and return to prior level of participation at home and in the community. Baseline: inability to have penetrative sex, increased pain with the exams and sitting; (12/6): Pt continues not to engage in penetrative sex, sitting for more than 30 mins has improved significantly; not limited to physical activity  Goal status: IN PROGRESS  5.  Patient will demonstrate understanding of basic self-management/down-regulation of the nervous system for persistent pain condition and stress as evidenced by diaphragmatic breathing without cueing and body scan/progressive relaxation meditation in order to transition to independent management of patient's chief concern: pelvic pain. Baseline: will continue to assess in further sessions; (12/6): Pt has continually demonstrated these techniques but at times forgets with increased stress  Goal status: IN PROGRESS    PLAN: PT Frequency: 1x/week  PT Duration: 10 weeks  Planned Interventions: Therapeutic exercises, Therapeutic activity, Neuromuscular re-education, Balance training, Gait training, Patient/Family education, Self Care, Joint mobilization, Spinal mobilization, Cryotherapy, Moist heat, scar mobilization, Taping, and Manual therapy  Plan For Next Session:  internal! or continue PFM lengthening techniques, posture in sitting strength (ER with band, or lying with weights/band/cat cow/childs pose/LTR, pelvic tilts then manual at back?    Theta Leaf, PT,  DPT  09/28/2022, 4:06 PM

## 2022-10-04 ENCOUNTER — Ambulatory Visit: Payer: 59

## 2022-10-10 ENCOUNTER — Ambulatory Visit: Payer: 59

## 2022-10-18 ENCOUNTER — Ambulatory Visit: Payer: 59

## 2022-10-18 DIAGNOSIS — M6289 Other specified disorders of muscle: Secondary | ICD-10-CM | POA: Diagnosis not present

## 2022-10-18 DIAGNOSIS — R102 Pelvic and perineal pain: Secondary | ICD-10-CM | POA: Diagnosis not present

## 2022-10-18 DIAGNOSIS — R278 Other lack of coordination: Secondary | ICD-10-CM | POA: Diagnosis not present

## 2022-10-18 NOTE — Therapy (Signed)
OUTPATIENT PHYSICAL THERAPY FEMALE PELVIC TREATMENT   Patient Name: Ana Knapp MRN: [id number] DOB:July 31, 1981, 41 y.o., female Today's Date: 10/18/2022   PT End of Session - 10/18/22 1447     Visit Number 9    Number of Visits 20    Date for PT Re-Evaluation 12/07/22    Authorization Type IE: 07/04/22; RC: 09/28/22    PT Start Time 1447    PT Stop Time 1527    PT Time Calculation (min) 40 min    Activity Tolerance Patient tolerated treatment well             Past Medical History:  Diagnosis Date   Chronic sinusitis    Fibromyalgia    GERD (gastroesophageal reflux disease)    Headache    sinus   History of melanoma 11/22/2017   History of melanoma 2018   Back   May-Thurner syndrome 11/30/2015   January 2017; 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[facility]    PONV (postoperative nausea and vomiting)    Pulmonary embolism [facility])    Past Surgical History:  Procedure Laterality Date   ANGIOPLASTY / STENTING FEMORAL  March 2017   CHOLECYSTECTOMY     COLONOSCOPY WITH PROPOFOL N/A 01/02/2018   Procedure: COLONOSCOPY WITH PROPOFOL;  Surgeon: Lucilla Lame, MD;  Location: [facility] ENDOSCOPY;  Service: Endoscopy;  Laterality: N/A;   ESOPHAGOGASTRODUODENOSCOPY (EGD) WITH PROPOFOL N/A 01/02/2018   Procedure: ESOPHAGOGASTRODUODENOSCOPY (EGD) WITH PROPOFOL;  Surgeon: Lucilla Lame, MD;  Location: [facility] ENDOSCOPY;  Service: Endoscopy;  Laterality: N/A;   LAPAROSCOPY N/A 02/26/2018   Procedure: LAPAROSCOPY DIAGNOSTIC WITH BIOPSIES;  Surgeon: Rubie Maid, MD;  Location: [facility] ORS;  Service: Gynecology;  Laterality: N/A;   Lytics Catheter Placement  11/16/15   MELANOMA EXCISION  2018   Back   ROBOTIC ASSISTED LAPAROSCOPIC CHOLECYSTECTOMY-SINGLE SITE  12/29/2016   Patient Active Problem List   Diagnosis Date Noted   Pelvic pain 05/10/2022   Endometriosis 03/01/2018   Abnormal CT scan    History of melanoma 11/22/2017   Biliary dyskinesia 12/05/2016   Constipation 08/18/2016   May-Thurner syndrome  11/30/2015   DVT (deep venous thrombosis) ([place]) 11/13/2015   History of pulmonary embolism 11/13/2015   GERD (gastroesophageal reflux disease) 09/28/2015   Flushing 09/24/2015   Fibromyalgia 08/30/2015   Seasonal allergic rhinitis 08/30/2015   Fibrositis 08/30/2015    PCP: Steele Sizer, MD  REFERRING PROVIDER: Philip Aspen, CNM   REFERRING DIAG:  R10.2 (ICD-10-CM) - Pelvic pain   THERAPY DIAG:  Pelvic pain  Pelvic floor dysfunction  Other lack of coordination  Rationale for Evaluation and Treatment: Rehabilitation  ONSET DATE: A few years ago  PRECAUTIONS: None  WEIGHT BEARING RESTRICTIONS: No  FALLS:  Has patient fallen in last 6 months? 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 Pt feels like the pain is becoming more constant and on recent pelvic u/s were normal. Pt has been having "horrible" menstrual cycles especially the last 2 times. Pt can see blood clots in the toilet. B/c of the blood clots Pt is not able to take any type of birth control. Pt has to use overnight pads during menstrual cycle. Pt notices more pain when she is sitting down at rest.   Pain location: Deep and Right  Pain type: aching and sharp Pain description: intermittent, constant, sharp, and stabbing   Aggravating factors: period, stress, sitting longer periods of time  Relieving factors: some physical activity    PATIENT GOALS: Pt would like to decrease pain and to get back to normal BMs   UROLOGICAL HISTORY Pt has no concerns  Fluid intake:   Pain with urination:  Fully empty bladder:  Stream:  Urgency: No Frequency: 6-7x Nocturia:  0x   GASTROINTESTINAL HISTORY Pain with bowel movement: Yes Type of bowel movement:Type (Bristol Stool Scale) 6 and Strain Yes Frequency: every 1-5x/day with taking Miralax 2x per day and  Fully empty rectum: No  Leakage: No    SEXUAL HISTORY/FUNCTION Pain with intercourse: Initial Penetration, During Penetration, During Climax, and Pain Interrupts Intercourse Ability to have vaginal penetration:  No; Deep thrusting: No Able to achieve orgasm?: Yes, but pain   OBSTETRICAL HISTORY Vaginal deliveries: G0P0   GYNECOLOGICAL HISTORY Hysterectomy: no Pelvic Organ Prolapse: None Pain with exam: yes  Heaviness/pressure: no   SUBJECTIVE:  Pt has been taking the Miragummies 2x per day for a few weeks now and has been having a BM everyday. Pt's last menstrual cycle also "not as bad" compared to severe pain the past two cycles.    PAIN:  Are you having pain? No NPRS scale: 2/10, R lower abdominals     OBJECTIVE:   DIAGNOSTIC TESTING/IMPRESSIONS:  05/16/22 U/S results:  Indications:Pelvic Pain, h/o Endometriosis  Impression: 1. Single Intramural fibroid, Right lateral fundus  COGNITION: Overall cognitive status: Within functional limits for tasks assessed     POSTURE:   Iliac crest height: L iliac crest, L shoulder elevated, B rounding of shoulders in sitting  Pelvic obliquity: WNL   RANGE OF MOTION:    (Norm range in degrees)  LEFT 07/13/22 RIGHT 07/13/22  Lumbar forward flexion (65):  WNL    Lumbar extension (30): WNL    Lumbar lateral flexion (25):  WNL WNL   Thoracic and Lumbar rotation (30 degrees):    WNL WNL   Hip Flexion (0-125):   WNL WNL  Hip IR (0-45):  Slight restriction WNL  Hip ER (0-45):  WNL WNL  Hip Adduction:      Hip Abduction (0-40):  WNL WNL  Hip extension (0-15):     (*= pain, Blank rows = not tested)    STRENGTH: MMT   RLE 07/13/22 LLE 07/13/22  Hip Flexion 5 5  Hip Extension 4 4  Hip Abduction     Hip Adduction     Hip ER  5 5   Hip IR  5 5  Knee Extension 5 5  Knee Flexion 5 5  Dorsiflexion     Plantarflexion (seated) 5  5  (*= pain, Blank rows = not tested)   SPECIAL TESTS:   FABER (SN 81): negative B FADIR (SN 94): negative B   PALPATION:  Abdominal:  Diastasis: none Scar mobility: R and L of umbilicus from stent placement   -Significant tension throughout abdomen particularly RLQ and by umbilicus, increased pain at RLQ with noticeable Valsalva maneuver   EXTERNAL PELVIC EXAM: Patient educated on the purpose of the pelvic exam and articulated understanding; patient consented to the exam verbally.  Breath coordination: present Voluntary Contraction: present, 2/5 MMT Relaxation: full Perineal movement with sustained IAP increase ("bear down"): elevation, when asked again - no change Perineal movement with rapid IAP increase ("cough"): no change (0= no contraction, 1= flicker, 2= weak squeeze, 3= fair squeeze with lift, 4= good squeeze and lift against resistance, 5= strong squeeze against strong resistance)   TODAY'S TREATMENT:  Neuromuscular Re-education:  INTERNAL PELVIC EXAM: Patient educated on the purpose of the pelvic exam and articulated understanding; patient consented to the exam verbally.  Introitus Appears: WNL Skin integrity: WNL Scar mobility: N/A Strength (PERF): did not assess, present full breath coordination Symmetry: R with increased pain compared to L, but muscular tension B Palpation: At 6 o'clock Pt with increased pain towards the R when pressure added, able to tolerate insertion of finger to PIP until increased pain and radiation towards R lower abdominals - ended exam   Discussion on graded exposure with use of finger first as Pt with some hesitation with dilator training. DPT expressed how internal manual work can be continued in PFPT as Pt is comfortable. Pt verbalized understanding. Pt given educational handout and brands to consider if interested later in dilators  (Intimate Rose)   Discussion on some soreness internally at the pelvic floor is common and expected after internal PFM exam. DPT advised heat modality as needed. Pt verbalized understanding.   Lengthy discussion on how increased muscular tension of PFM can contribute to constipation/PFM lengthening and extensibility dysfunction.   Patient response to interventions: Pt with no soreness/pain in lower abdominals after PFM internal exam   Patient Education:  Patient provided with HEP including: graded exposure educational handout. Patient educated throughout session on appropriate technique and form using multi-modal cueing, HEP, and activity modification. Patient will benefit from further education in order to maximize compliance and understanding for long-term therapeutic gains.    ASSESSMENT:  Clinical Impression: Patient presents to clinic with excellent motivation to participate in today's session. Pt continues to demonstrate deficits in ROM, PFM coordination, PFM extensibility, posture, scar mobility, and pain. Over the past two weeks, Pt has had a BM everyday. Pt has stopped taking powder Miralax and is using Miralax gummies which has helped tremendously. Pt also reports that last menstrual cycle was tolerable and nothing compared to the severe pain with the oast 2 cycles. In addition, Pt did have a fall onto the tailbone about a week ago and currently has some discomfort in sitting. DPT advised ice modality and to continue with pain modulation techniques for the tailbone discussed in previous sessions. Discussion on how continuous impact on the tailbone can affect tension in the PFM. Upon internal PFM assessment, Pt with significant muscular tension and only able to tolerate insertion up to PIP as Pt began to have radiating pain towards R abdomen. With cueing for diaphragmatic breathing, R hip movement (single knee to chest), and gentle pressure, the discomfort/pain at the lower abdomen ceased.  Internal exam ended and time taken to educate based on  findings.  Pt responded well to active and educational interventions. Pt will continue to to benefit from skilled therapeutic intervention to address deficits in ROM, PFM coordination, PFM extensibility, posture, scar mobility, and pain in order to increase PLOF and improve overall QOL.    Objective Impairments: decreased coordination, hypomobility, increased muscle spasms, improper body mechanics, postural dysfunction, and pain.   Activity Limitations: sitting, standing, and toileting  Personal Factors: Age, Past/current experiences, Time since onset of injury/illness/exacerbation, and 3+ comorbidities: fibromyalgia, hx of endometriosis, hx of DVT/PE  are also affecting patient's functional outcome.   Rehab Potential: Good  Clinical Decision Making: Evolving/moderate complexity  Evaluation Complexity: Moderate   GOALS: Goals reviewed with patient? Yes  SHORT TERM GOALS: Target date: 08/08/2022  Patient will demonstrate independence with HEP in order to maximize therapeutic gains and improve carryover from physical therapy sessions to ADLs in the home and community. Baseline: toileting posture handout; (12/6): Pt is IND with HEP Goal status: MET    LONG TERM GOALS: Target date: 12/07/22  Patient will score >/= 54 on FOTO Bowel Constipation  in order to demonstrate decreased pain, improved PFM coordination, improved PFM extensibility and overall QOL.  Baseline: 41; (12/6): 47 Goal status: IN PROGRESS  2.  Patient will demonstrate coordinated lengthening and relaxation of PFM with diaphragmatic inhalation in order to decrease spasm and allow for unrestricted elimination of urine/feces for improved overall QOL. Baseline: hx of significant constipation; (12/6): Pt continues to have constipation but Miralax has helped and Amitiza Goal status: IN PROGRESS  3.  Patient will decrease worst pain as reported on NPRS by at least 2  points to demonstrate clinically significant reduction in pain in order to restore/improve function and overall QOL. Baseline: 10/10 pelvic pain; (12/6): 5/10 mostly due to increased feeling of bloating/unstable to use bathroom but increased pain with last few menstrual cycles Goal status: MET  4.  Patient will report being able to return to activities including, but not limited to: penetrative sex, sitting for long periods of time, physical activity, and annual GYN exams without pain or limitation to indicate complete resolution of the chief concern and return to prior level of participation at home and in the community. Baseline: inability to have penetrative sex, increased pain with the exams and sitting; (12/6): Pt continues not to engage in penetrative sex, sitting for more than 30 mins has improved significantly; not limited to physical activity  Goal status: IN PROGRESS  5.  Patient will demonstrate understanding of basic self-management/down-regulation of the nervous system for persistent pain condition and stress as evidenced by diaphragmatic breathing without cueing and body scan/progressive relaxation meditation in order to transition to independent management of patient's chief concern: pelvic pain. Baseline: will continue to assess in further sessions; (12/6): Pt has continually demonstrated these techniques but at times forgets with increased stress  Goal status: IN PROGRESS    PLAN: PT Frequency: 1x/week  PT Duration: 10 weeks  Planned Interventions: Therapeutic exercises, Therapeutic activity, Neuromuscular re-education, Balance training, Gait training, Patient/Family education, Self Care, Joint mobilization, Spinal mobilization, Cryotherapy, Moist heat, scar mobilization, Taping, and Manual therapy  Plan For Next Session: how did you feel after internal assessment? Continue with internal techniques, strengthening posture in standing     Amatullah Christy, PT,  DPT  10/18/2022, 3:51 PM

## 2022-11-10 ENCOUNTER — Ambulatory Visit: Payer: 59 | Admitting: Physical Therapy

## 2022-11-28 ENCOUNTER — Ambulatory Visit: Payer: 59 | Attending: Certified Nurse Midwife | Admitting: Physical Therapy

## 2022-11-28 ENCOUNTER — Telehealth: Payer: Self-pay | Admitting: Physical Therapy

## 2022-11-28 NOTE — Telephone Encounter (Signed)
Physical therapist left VM re: no showing to her appt today. Explained the "2 no- show" appt policy. Requested pt call back [phone] to confirm whether she plans to keep/ cancel her remaining appts as we have a waiting list of patients to work in.

## 2022-11-29 ENCOUNTER — Telehealth: Payer: 59 | Admitting: Physician Assistant

## 2022-11-29 DIAGNOSIS — J019 Acute sinusitis, unspecified: Secondary | ICD-10-CM | POA: Diagnosis not present

## 2022-11-29 DIAGNOSIS — B9689 Other specified bacterial agents as the cause of diseases classified elsewhere: Secondary | ICD-10-CM | POA: Diagnosis not present

## 2022-11-29 MED ORDER — ONDANSETRON 4 MG PO TBDP: 4.0000 mg | ORAL_TABLET | Freq: Three times a day (TID) | ORAL | 0 refills | Status: DC | PRN

## 2022-11-29 MED ORDER — CEFDINIR 300 MG PO CAPS: 300.0000 mg | ORAL_CAPSULE | Freq: Two times a day (BID) | ORAL | 0 refills | Status: DC

## 2022-11-29 NOTE — Progress Notes (Signed)
Virtual Visit Consent   Ana Knapp, you are scheduled for a virtual visit with a [facility] provider today. Just as with appointments in the office, your consent must be obtained to participate. Your consent will be active for this visit and any virtual visit you may have with one of our providers in the next 365 days. If you have a MyChart account, a copy of this consent can be sent to you electronically.  As this is a virtual visit, video technology does not allow for your provider to perform a traditional examination. This may limit your provider's ability to fully assess your condition. If your provider identifies any concerns that need to be evaluated in person or the need to arrange testing (such as labs, EKG, etc.), we will make arrangements to do so. Although advances in technology are sophisticated, we cannot ensure that it will always work on either your end or our end. If the connection with a video visit is poor, the visit may have to be switched to a telephone visit. With either a video or telephone visit, we are not always able to ensure that we have a secure connection.  By engaging in this virtual visit, you consent to the provision of healthcare and authorize for your insurance to be billed (if applicable) for the services provided during this visit. Depending on your insurance coverage, you may receive a charge related to this service.  I need to obtain your verbal consent now. Are you willing to proceed with your visit today? Ana Knapp has provided verbal consent on 11/29/2022 for a virtual visit (video or telephone). Mar Daring, PA-C  Date: 11/29/2022 5:50 PM  Virtual Visit via Video Note   I, Mar Daring, [name]  ([id number], October 07, 1981) on 11/29/22 at  5:45 PM EST by a video-enabled telemedicine application and verified that I am speaking with the correct person using two identifiers.  Location: Patient: Virtual  Visit Location Patient: Home Provider: Virtual Visit Location Provider: Home Office   I discussed the limitations of evaluation and management by telemedicine and the availability of in person appointments. The patient expressed understanding and agreed to proceed.    History of Present Illness: [name] is a 42 y.o. who identifies as a female who was assigned female at birth, and is being seen today for possible sinus infection.  HPI: Sinusitis This is a new problem. The current episode started 1 to 4 weeks ago. The problem has been gradually worsening since onset. There has been no fever. Associated symptoms include chills (mild), congestion, coughing (mild), ear pain (right), headaches, sinus pressure, a sore throat (did have sore throat 2 weeks ago, improved with salt water gargles) and swollen glands. Pertinent negatives include no shortness of breath. (Post nasal drainage causing nausea) Treatments tried: dayquil, nyquil, allergy meds (xyzal, singulair, flonase), Mucinex. The treatment provided no relief.     Problems:  Patient Active Problem List   Diagnosis Date Noted   Pelvic pain 05/10/2022   Endometriosis 03/01/2018   Abnormal CT scan    History of melanoma 11/22/2017   Biliary dyskinesia 12/05/2016   Constipation 08/18/2016   May-Thurner syndrome 11/30/2015   DVT (deep venous thrombosis) ([place]) 11/13/2015   History of pulmonary embolism 11/13/2015   GERD (gastroesophageal reflux disease) 09/28/2015   Flushing 09/24/2015   Fibromyalgia 08/30/2015   Seasonal allergic rhinitis 08/30/2015   Fibrositis 08/30/2015    Allergies:  Allergies  Allergen Reactions  Ortho Tri-Cyclen [Norgestimate-Eth Estradiol] Other (See Comments)    Blood clot and PE   Prednisone Anaphylaxis   Augmentin [Amoxicillin-Pot Clavulanate] Other (See Comments)    Upsets her stomach   Doxycycline     Nausea, diarrhea   Medications:  Current Outpatient Medications:    cefdinir (OMNICEF)  300 MG capsule, Take 1 capsule (300 mg total) by mouth 2 (two) times daily., Disp: 20 capsule, Rfl: 0   ondansetron (ZOFRAN-ODT) 4 MG disintegrating tablet, Take 1 tablet (4 mg total) by mouth every 8 (eight) hours as needed., Disp: 20 tablet, Rfl: 0   acetaminophen (TYLENOL) 325 MG tablet, Take 650 mg by mouth every 6 (six) hours as needed., Disp: , Rfl:    aspirin 81 MG EC tablet, Take 1 tablet by mouth daily., Disp: , Rfl:    benzonatate (TESSALON) 100 MG capsule, Take 1 capsule (100 mg total) by mouth 3 (three) times daily as needed., Disp: 30 capsule, Rfl: 0   brompheniramine-pseudoephedrine-DM 30-2-10 MG/5ML syrup, Take 5 mLs by mouth 4 (four) times daily as needed., Disp: 120 mL, Rfl: 0   diclofenac Sodium (VOLTAREN) 1 % GEL, Apply 4 g topically 4 (four) times daily., Disp: 200 g, Rfl: 1   esomeprazole (NEXIUM) 40 MG capsule, TAKE 1 CAPSULE (40 MG TOTAL) BY MOUTH 2 (TWO) TIMES DAILY BEFORE A MEAL., Disp: 180 capsule, Rfl: 2   fluticasone (FLONASE) 50 MCG/ACT nasal spray, Place 2 sprays into both nostrils daily., Disp: 16 g, Rfl: 2   levocetirizine (XYZAL) 5 MG tablet, Take 1 tablet (5 mg total) by mouth every evening., Disp: 30 tablet, Rfl: 11   linaclotide (LINZESS) 72 MCG capsule, Take 1 capsule (72 mcg total) by mouth daily before breakfast., Disp: 30 capsule, Rfl: 0   lubiprostone (AMITIZA) 24 MCG capsule, Take 1 capsule (24 mcg total) by mouth 2 (two) times daily with a meal., Disp: 60 capsule, Rfl: 3   montelukast (SINGULAIR) 10 MG tablet, TAKE 1 TABLET(10 MG) BY MOUTH AT BEDTIME, Disp: 90 tablet, Rfl: 0   Multiple Vitamin (MULTIVITAMIN) tablet, Take 1 tablet by mouth daily., Disp: , Rfl:    ondansetron (ZOFRAN) 4 MG tablet, TAKE 1 TABLET(4 MG) BY MOUTH EVERY 8 HOURS AS NEEDED FOR NAUSEA OR VOMITING, Disp: 20 tablet, Rfl: 0   polyethylene glycol powder (GLYCOLAX/MIRALAX) 17 GM/SCOOP powder, Take 17 g by mouth 2 (two) times daily as needed for moderate constipation., Disp: 255 g, Rfl: 0    Sod Fluoride-Potassium Nitrate (PREVIDENT 5000 SENSITIVE) 1.1-5 % PSTE, , Disp: , Rfl:   Observations/Objective: Patient is well-developed, well-nourished in no acute distress.  Resting comfortably at home.  Head is normocephalic, atraumatic.  No labored breathing.  Speech is clear and coherent with logical content.  Patient is alert and oriented at baseline.    Assessment and Plan: 1. Acute bacterial sinusitis - cefdinir (OMNICEF) 300 MG capsule; Take 1 capsule (300 mg total) by mouth 2 (two) times daily.  Dispense: 20 capsule; Refill: 0 - ondansetron (ZOFRAN-ODT) 4 MG disintegrating tablet; Take 1 tablet (4 mg total) by mouth every 8 (eight) hours as needed.  Dispense: 20 tablet; Refill: 0  - Worsening symptoms that have not responded to OTC medications.  - Will give Omnicef and Zofran - Continue allergy medications.  - Steam and humidifier can help - Stay well hydrated and get plenty of rest.  - Seek in person evaluation if no symptom improvement or if symptoms worsen   Follow Up Instructions: I discussed the assessment and  treatment plan with the patient. The patient was provided an opportunity to ask questions and all were answered. The patient agreed with the plan and demonstrated an understanding of the instructions.  A copy of instructions were sent to the patient via MyChart unless otherwise noted below.    The patient was advised to call back or seek an in-person evaluation if the symptoms worsen or if the condition fails to improve as anticipated.  Time:  I spent 10 minutes with the patient via telehealth technology discussing the above problems/concerns.    Mar Daring, PA-C

## 2022-11-29 NOTE — Patient Instructions (Signed)
[name], thank you for joining Mar Daring, PA-C for today's virtual visit.  While this provider is not your primary care provider (PCP), if your PCP is located in our provider database this encounter information will be shared with them immediately following your visit.   [place] account gives you access to today's visit and all your visits, tests, and labs performed at [facility] " click here if you don't have a [facility] account or go to [email or web address]  Consent: (Patient) Ana Knapp provided verbal consent for this virtual visit at the beginning of the encounter.  Current Medications:  Current Outpatient Medications:    cefdinir (OMNICEF) 300 MG capsule, Take 1 capsule (300 mg total) by mouth 2 (two) times daily., Disp: 20 capsule, Rfl: 0   ondansetron (ZOFRAN-ODT) 4 MG disintegrating tablet, Take 1 tablet (4 mg total) by mouth every 8 (eight) hours as needed., Disp: 20 tablet, Rfl: 0   acetaminophen (TYLENOL) 325 MG tablet, Take 650 mg by mouth every 6 (six) hours as needed., Disp: , Rfl:    aspirin 81 MG EC tablet, Take 1 tablet by mouth daily., Disp: , Rfl:    benzonatate (TESSALON) 100 MG capsule, Take 1 capsule (100 mg total) by mouth 3 (three) times daily as needed., Disp: 30 capsule, Rfl: 0   brompheniramine-pseudoephedrine-DM 30-2-10 MG/5ML syrup, Take 5 mLs by mouth 4 (four) times daily as needed., Disp: 120 mL, Rfl: 0   diclofenac Sodium (VOLTAREN) 1 % GEL, Apply 4 g topically 4 (four) times daily., Disp: 200 g, Rfl: 1   esomeprazole (NEXIUM) 40 MG capsule, TAKE 1 CAPSULE (40 MG TOTAL) BY MOUTH 2 (TWO) TIMES DAILY BEFORE A MEAL., Disp: 180 capsule, Rfl: 2   fluticasone (FLONASE) 50 MCG/ACT nasal spray, Place 2 sprays into both nostrils daily., Disp: 16 g, Rfl: 2   levocetirizine (XYZAL) 5 MG tablet, Take 1 tablet (5 mg total) by mouth every evening., Disp: 30 tablet, Rfl: 11   linaclotide (LINZESS) 72 MCG  capsule, Take 1 capsule (72 mcg total) by mouth daily before breakfast., Disp: 30 capsule, Rfl: 0   lubiprostone (AMITIZA) 24 MCG capsule, Take 1 capsule (24 mcg total) by mouth 2 (two) times daily with a meal., Disp: 60 capsule, Rfl: 3   montelukast (SINGULAIR) 10 MG tablet, TAKE 1 TABLET(10 MG) BY MOUTH AT BEDTIME, Disp: 90 tablet, Rfl: 0   Multiple Vitamin (MULTIVITAMIN) tablet, Take 1 tablet by mouth daily., Disp: , Rfl:    ondansetron (ZOFRAN) 4 MG tablet, TAKE 1 TABLET(4 MG) BY MOUTH EVERY 8 HOURS AS NEEDED FOR NAUSEA OR VOMITING, Disp: 20 tablet, Rfl: 0   polyethylene glycol powder (GLYCOLAX/MIRALAX) 17 GM/SCOOP powder, Take 17 g by mouth 2 (two) times daily as needed for moderate constipation., Disp: 255 g, Rfl: 0   Sod Fluoride-Potassium Nitrate (PREVIDENT 5000 SENSITIVE) 1.1-5 % PSTE, , Disp: , Rfl:    Medications ordered in this encounter:  Meds ordered this encounter  Medications   cefdinir (OMNICEF) 300 MG capsule    Sig: Take 1 capsule (300 mg total) by mouth 2 (two) times daily.    Dispense:  20 capsule    Refill:  0    Order Specific Question:   Supervising Provider    Answer:   LAMPTEY, PHILIP O [[id number]]   ondansetron (ZOFRAN-ODT) 4 MG disintegrating tablet    Sig: Take 1 tablet (4 mg total) by mouth every 8 (eight) hours as needed.    Dispense:  20 tablet    Refill:  0    Order Specific Question:   Supervising Provider    Answer:   Chase Picket [[id number]]     *If you need refills on other medications prior to your next appointment, please contact your pharmacy*  Follow-Up: Call back or seek an in-person evaluation if the symptoms worsen or if the condition fails to improve as anticipated.  [place] [phone]  Other Instructions  Sinus Infection, Adult A sinus infection, also called sinusitis, is inflammation of your sinuses. Sinuses are hollow spaces in the bones around your face. Your sinuses are located: Around your eyes. In the  middle of your forehead. Behind your nose. In your cheekbones. Mucus normally drains out of your sinuses. When your nasal tissues become inflamed or swollen, mucus can become trapped or blocked. This allows bacteria, viruses, and fungi to grow, which leads to infection. Most infections of the sinuses are caused by a virus. A sinus infection can develop quickly. It can last for up to 4 weeks (acute) or for more than 12 weeks (chronic). A sinus infection often develops after a cold. What are the causes? This condition is caused by anything that creates swelling in the sinuses or stops mucus from draining. This includes: Allergies. Asthma. Infection from bacteria or viruses. Deformities or blockages in your nose or sinuses. Abnormal growths in the nose (nasal polyps). Pollutants, such as chemicals or irritants in the air. Infection from fungi. This is rare. What increases the risk? You are more likely to develop this condition if you: Have a weak body defense system (immune system). Do a lot of swimming or diving. Overuse nasal sprays. Smoke. What are the signs or symptoms? The main symptoms of this condition are pain and a feeling of pressure around the affected sinuses. Other symptoms include: Stuffy nose or congestion that makes it difficult to breathe through your nose. Thick yellow or greenish drainage from your nose. Tenderness, swelling, and warmth over the affected sinuses. A cough that may get worse at night. Decreased sense of smell and taste. Extra mucus that collects in the throat or the back of the nose (postnasal drip) causing a sore throat or bad breath. Tiredness (fatigue). Fever. How is this diagnosed? This condition is diagnosed based on: Your symptoms. Your medical history. A physical exam. Tests to find out if your condition is acute or chronic. This may include: Checking your nose for nasal polyps. Viewing your sinuses using a device that has a light  (endoscope). Testing for allergies or bacteria. Imaging tests, such as an MRI or CT scan. In rare cases, a bone biopsy may be done to rule out more serious types of fungal sinus disease. How is this treated? Treatment for a sinus infection depends on the cause and whether your condition is chronic or acute. If caused by a virus, your symptoms should go away on their own within 10 days. You may be given medicines to relieve symptoms. They include: Medicines that shrink swollen nasal passages (decongestants). A spray that eases inflammation of the nostrils (topical intranasal corticosteroids). Rinses that help get rid of thick mucus in your nose (nasal saline washes). Medicines that treat allergies (antihistamines). Over-the-counter pain relievers. If caused by bacteria, your health care provider may recommend waiting to see if your symptoms improve. Most bacterial infections will get better without antibiotic medicine. You may be given antibiotics if you have: A severe infection. A weak immune system. If caused by narrow  nasal passages or nasal polyps, surgery may be needed. Follow these instructions at home: Medicines Take, use, or apply over-the-counter and prescription medicines only as told by your health care provider. These may include nasal sprays. If you were prescribed an antibiotic medicine, take it as told by your health care provider. Do not stop taking the antibiotic even if you start to feel better. Hydrate and humidify  Drink enough fluid to keep your urine pale yellow. Staying hydrated will help to thin your mucus. Use a cool mist humidifier to keep the humidity level in your home above 50%. Inhale steam for 10-15 minutes, 3-4 times a day, or as told by your health care provider. You can do this in the bathroom while a hot shower is running. Limit your exposure to cool or dry air. Rest Rest as much as possible. Sleep with your head raised (elevated). Make sure you get  enough sleep each night. General instructions  Apply a warm, moist washcloth to your face 3-4 times a day or as told by your health care provider. This will help with discomfort. Use nasal saline washes as often as told by your health care provider. Wash your hands often with soap and water to reduce your exposure to germs. If soap and water are not available, use hand sanitizer. Do not smoke. Avoid being around people who are smoking (secondhand smoke). Keep all follow-up visits. This is important. Contact a health care provider if: You have a fever. Your symptoms get worse. Your symptoms do not improve within 10 days. Get help right away if: You have a severe headache. You have persistent vomiting. You have severe pain or swelling around your face or eyes. You have vision problems. You develop confusion. Your neck is stiff. You have trouble breathing. These symptoms may be an emergency. Get help right away. Call 911. Do not wait to see if the symptoms will go away. Do not drive yourself to the hospital. Summary A sinus infection is soreness and inflammation of your sinuses. Sinuses are hollow spaces in the bones around your face. This condition is caused by nasal tissues that become inflamed or swollen. The swelling traps or blocks the flow of mucus. This allows bacteria, viruses, and fungi to grow, which leads to infection. If you were prescribed an antibiotic medicine, take it as told by your health care provider. Do not stop taking the antibiotic even if you start to feel better. Keep all follow-up visits. This is important. This information is not intended to replace advice given to you by your health care provider. Make sure you discuss any questions you have with your health care provider. Document Revised: 09/14/2021 Document Reviewed: 09/14/2021 Elsevier Patient Education  [place].    If you have been instructed to have an in-person evaluation today at a local  Urgent Care facility, please use the link below. It will take you to a list of all of our available [facility], including address, phone number and hours of operation. Please do not delay care.  [facility]  If you or a family member do not have a primary care provider, use the link below to schedule a visit and establish care. When you choose a [facility] primary care physician or advanced practice provider, you gain a long-term partner in health. Find a Primary Care Provider  Learn more about [facility]'s in-office and virtual care options: [facility]

## 2022-12-05 ENCOUNTER — Ambulatory Visit: Payer: 59 | Admitting: Physical Therapy

## 2022-12-12 ENCOUNTER — Ambulatory Visit: Payer: 59 | Admitting: Physical Therapy

## 2022-12-19 ENCOUNTER — Encounter: Payer: 59 | Admitting: Physical Therapy

## 2022-12-26 ENCOUNTER — Encounter: Payer: 59 | Admitting: Physical Therapy

## 2023-01-02 ENCOUNTER — Encounter: Payer: 59 | Admitting: Physical Therapy

## 2023-01-07 ENCOUNTER — Other Ambulatory Visit: Payer: Self-pay | Admitting: Nurse Practitioner

## 2023-01-07 DIAGNOSIS — J302 Other seasonal allergic rhinitis: Secondary | ICD-10-CM

## 2023-01-09 ENCOUNTER — Encounter: Payer: 59 | Admitting: Physical Therapy

## 2023-01-09 NOTE — Telephone Encounter (Signed)
Requested Prescriptions  Pending Prescriptions Disp Refills   montelukast (SINGULAIR) 10 MG tablet [Pharmacy Med Name: MONTELUKAST SOD 10 MG TABLET] 90 tablet 0    Sig: TAKE 1 TABLET BY MOUTH AT BEDTIME     Pulmonology:  Leukotriene Inhibitors Passed - 01/07/2023  8:33 AM      Passed - Valid encounter within last 12 months    Recent Outpatient Visits           6 months ago Chronic constipation   [facility] Bo Merino, FNP   11 months ago Fatigue, unspecified type   [facility] Bo Merino, FNP   1 year ago Antibiotic-associated diarrhea   [facility] Bo Merino, FNP   1 year ago Viral upper respiratory tract infection   [facility], FNP   1 year ago Chronic constipation   [facility] Steele Sizer, MD

## 2023-01-23 ENCOUNTER — Encounter: Payer: 59 | Admitting: Physical Therapy

## 2023-01-30 ENCOUNTER — Encounter: Payer: 59 | Admitting: Physical Therapy

## 2023-02-06 ENCOUNTER — Encounter: Payer: 59 | Admitting: Physical Therapy

## 2023-04-08 ENCOUNTER — Other Ambulatory Visit: Payer: Self-pay | Admitting: Family Medicine

## 2023-04-08 DIAGNOSIS — J302 Other seasonal allergic rhinitis: Secondary | ICD-10-CM

## 2023-06-20 DIAGNOSIS — B078 Other viral warts: Secondary | ICD-10-CM | POA: Diagnosis not present

## 2023-06-20 DIAGNOSIS — D2272 Melanocytic nevi of left lower limb, including hip: Secondary | ICD-10-CM | POA: Diagnosis not present

## 2023-06-20 DIAGNOSIS — D2261 Melanocytic nevi of right upper limb, including shoulder: Secondary | ICD-10-CM | POA: Diagnosis not present

## 2023-06-20 DIAGNOSIS — D2262 Melanocytic nevi of left upper limb, including shoulder: Secondary | ICD-10-CM | POA: Diagnosis not present

## 2023-06-20 DIAGNOSIS — Z872 Personal history of diseases of the skin and subcutaneous tissue: Secondary | ICD-10-CM | POA: Diagnosis not present

## 2023-06-20 DIAGNOSIS — D2271 Melanocytic nevi of right lower limb, including hip: Secondary | ICD-10-CM | POA: Diagnosis not present

## 2023-06-20 DIAGNOSIS — L821 Other seborrheic keratosis: Secondary | ICD-10-CM | POA: Diagnosis not present

## 2023-06-20 DIAGNOSIS — D225 Melanocytic nevi of trunk: Secondary | ICD-10-CM | POA: Diagnosis not present

## 2023-06-20 DIAGNOSIS — L578 Other skin changes due to chronic exposure to nonionizing radiation: Secondary | ICD-10-CM | POA: Diagnosis not present

## 2023-06-20 DIAGNOSIS — X32XXXA Exposure to sunlight, initial encounter: Secondary | ICD-10-CM | POA: Diagnosis not present

## 2023-06-26 ENCOUNTER — Other Ambulatory Visit: Payer: Self-pay | Admitting: Gastroenterology

## 2023-07-13 ENCOUNTER — Telehealth: Payer: 59 | Admitting: Physician Assistant

## 2023-07-13 DIAGNOSIS — B9689 Other specified bacterial agents as the cause of diseases classified elsewhere: Secondary | ICD-10-CM | POA: Diagnosis not present

## 2023-07-13 DIAGNOSIS — J019 Acute sinusitis, unspecified: Secondary | ICD-10-CM | POA: Diagnosis not present

## 2023-07-13 MED ORDER — AZELASTINE HCL 0.1 % NA SOLN
1.0000 | Freq: Two times a day (BID) | NASAL | 0 refills | Status: DC
Start: 2023-07-13 — End: 2024-07-11

## 2023-07-13 MED ORDER — AZITHROMYCIN 250 MG PO TABS
ORAL_TABLET | ORAL | 0 refills | Status: AC
Start: 2023-07-13 — End: 2023-07-18

## 2023-07-13 NOTE — Progress Notes (Signed)
Virtual Visit Consent   Ana Knapp, you are scheduled for a virtual visit with a [facility] provider today. Just as with appointments in the office, your consent must be obtained to participate. Your consent will be active for this visit and any virtual visit you may have with one of our providers in the next 365 days. If you have a MyChart account, a copy of this consent can be sent to you electronically.  As this is a virtual visit, video technology does not allow for your provider to perform a traditional examination. This may limit your provider's ability to fully assess your condition. If your provider identifies any concerns that need to be evaluated in person or the need to arrange testing (such as labs, EKG, etc.), we will make arrangements to do so. Although advances in technology are sophisticated, we cannot ensure that it will always work on either your end or our end. If the connection with a video visit is poor, the visit may have to be switched to a telephone visit. With either a video or telephone visit, we are not always able to ensure that we have a secure connection.  By engaging in this virtual visit, you consent to the provision of healthcare and authorize for your insurance to be billed (if applicable) for the services provided during this visit. Depending on your insurance coverage, you may receive a charge related to this service.  I need to obtain your verbal consent now. Are you willing to proceed with your visit today? Ana Knapp has provided verbal consent on 07/13/2023 for a virtual visit (video or telephone). Piedad Climes, [place]  Date: 07/13/2023 6:19 PM  Virtual Visit via Video Note   I, Piedad Climes, [name]  ([id number], 1981-04-29) on 07/13/23 at  6:15 PM EDT by a video-enabled telemedicine application and verified that I am speaking with the correct person using two identifiers.  Location: Patient: Virtual  Visit Location Patient: Home Provider: Virtual Visit Location Provider: Home Office   I discussed the limitations of evaluation and management by telemedicine and the availability of in person appointments. The patient expressed understanding and agreed to proceed.    History of Present Illness: Ana Knapp is a 42 y.o. who identifies as a female who was assigned female at birth, and is being seen today for possible sinusitis. Endorses symptoms for the past few weeks initially started as allergy symptoms -- nasal congestion, rhinorrhea and morphing into sinus pressure, sinus pain, increased congestion and nasal drainage. Denies fever. Has been taking her regular allergy medications.  OTC -- Mucinex  HPI: HPI  Problems:  Patient Active Problem List   Diagnosis Date Noted   Pelvic pain 05/10/2022   Endometriosis 03/01/2018   Abnormal CT scan    History of melanoma 11/22/2017   Biliary dyskinesia 12/05/2016   Constipation 08/18/2016   May-Thurner syndrome 11/30/2015   DVT (deep venous thrombosis) (HCC) 11/13/2015   History of pulmonary embolism 11/13/2015   GERD (gastroesophageal reflux disease) 09/28/2015   Flushing 09/24/2015   Fibromyalgia 08/30/2015   Seasonal allergic rhinitis 08/30/2015   Fibrositis 08/30/2015    Allergies:  Allergies  Allergen Reactions   Ortho Tri-Cyclen [Norgestimate-Eth Estradiol] Other (See Comments)    Blood clot and PE   Prednisone Anaphylaxis   Augmentin [Amoxicillin-Pot Clavulanate] Other (See Comments)    Upsets her stomach   Doxycycline     Nausea, diarrhea   Medications:  Current Outpatient Medications:  azelastine (ASTELIN) 0.1 % nasal spray, Place 1 spray into both nostrils 2 (two) times daily. Use in each nostril as directed, Disp: 30 mL, Rfl: 0   azithromycin (ZITHROMAX) 250 MG tablet, Take 2 tablets on day 1, then 1 tablet daily on days 2 through 5, Disp: 6 tablet, Rfl: 0   acetaminophen (TYLENOL) 325 MG tablet, Take 650 mg by  mouth every 6 (six) hours as needed., Disp: , Rfl:    aspirin 81 MG EC tablet, Take 1 tablet by mouth daily., Disp: , Rfl:    esomeprazole (NEXIUM) 40 MG capsule, Take 1 capsule (40 mg total) by mouth 2 (two) times daily before a meal. SCHEDULE OFFICE VISIT, Disp: 60 capsule, Rfl: 0   fluticasone (FLONASE) 50 MCG/ACT nasal spray, Place 2 sprays into both nostrils daily., Disp: 16 g, Rfl: 2   levocetirizine (XYZAL) 5 MG tablet, Take 1 tablet (5 mg total) by mouth every evening., Disp: 30 tablet, Rfl: 11   montelukast (SINGULAIR) 10 MG tablet, TAKE 1 TABLET BY MOUTH AT BEDTIME, Disp: 90 tablet, Rfl: 0   Multiple Vitamin (MULTIVITAMIN) tablet, Take 1 tablet by mouth daily., Disp: , Rfl:    ondansetron (ZOFRAN) 4 MG tablet, TAKE 1 TABLET(4 MG) BY MOUTH EVERY 8 HOURS AS NEEDED FOR NAUSEA OR VOMITING, Disp: 20 tablet, Rfl: 0   polyethylene glycol powder (GLYCOLAX/MIRALAX) 17 GM/SCOOP powder, Take 17 g by mouth 2 (two) times daily as needed for moderate constipation., Disp: 255 g, Rfl: 0   Sod Fluoride-Potassium Nitrate (PREVIDENT 5000 SENSITIVE) 1.1-5 % PSTE, , Disp: , Rfl:   Observations/Objective: Patient is well-developed, well-nourished in no acute distress.  Resting comfortably at home.  Head is normocephalic, atraumatic.  No labored breathing. Speech is clear and coherent with logical content.  Patient is alert and oriented at baseline.   Assessment and Plan: 1. Acute bacterial sinusitis - azithromycin (ZITHROMAX) 250 MG tablet; Take 2 tablets on day 1, then 1 tablet daily on days 2 through 5  Dispense: 6 tablet; Refill: 0 - azelastine (ASTELIN) 0.1 % nasal spray; Place 1 spray into both nostrils 2 (two) times daily. Use in each nostril as directed  Dispense: 30 mL; Refill: 0  Rx Azithromycin giving medication allergies/intolerances.  Increase fluids.  Rest.  Saline nasal spray.  Probiotic.  Mucinex as directed.  Humidifier in bedroom. Add on astelin nasal spray to current allergy regimen.   Call or return to clinic if symptoms are not improving.   Follow Up Instructions: I discussed the assessment and treatment plan with the patient. The patient was provided an opportunity to ask questions and all were answered. The patient agreed with the plan and demonstrated an understanding of the instructions.  A copy of instructions were sent to the patient via MyChart unless otherwise noted below.   The patient was advised to call back or seek an in-person evaluation if the symptoms worsen or if the condition fails to improve as anticipated.  Time:  I spent 10 minutes with the patient via telehealth technology discussing the above problems/concerns.    Piedad Climes, PA-C

## 2023-07-13 NOTE — Patient Instructions (Signed)
Lenise Arena, thank you for joining Piedad Climes, PA-C for today's virtual visit.  While this provider is not your primary care provider (PCP), if your PCP is located in our provider database this encounter information will be shared with them immediately following your visit.   A [facility] MyChart account gives you access to today's visit and all your visits, tests, and labs performed at [facility] " click here if you don't have a [facility] MyChart account or go to [email or web address]  Consent: (Patient) Ana Knapp provided verbal consent for this virtual visit at the beginning of the encounter.  Current Medications:  Current Outpatient Medications:    acetaminophen (TYLENOL) 325 MG tablet, Take 650 mg by mouth every 6 (six) hours as needed., Disp: , Rfl:    aspirin 81 MG EC tablet, Take 1 tablet by mouth daily., Disp: , Rfl:    benzonatate (TESSALON) 100 MG capsule, Take 1 capsule (100 mg total) by mouth 3 (three) times daily as needed., Disp: 30 capsule, Rfl: 0   brompheniramine-pseudoephedrine-DM 30-2-10 MG/5ML syrup, Take 5 mLs by mouth 4 (four) times daily as needed., Disp: 120 mL, Rfl: 0   cefdinir (OMNICEF) 300 MG capsule, Take 1 capsule (300 mg total) by mouth 2 (two) times daily., Disp: 20 capsule, Rfl: 0   diclofenac Sodium (VOLTAREN) 1 % GEL, Apply 4 g topically 4 (four) times daily., Disp: 200 g, Rfl: 1   esomeprazole (NEXIUM) 40 MG capsule, Take 1 capsule (40 mg total) by mouth 2 (two) times daily before a meal. SCHEDULE OFFICE VISIT, Disp: 60 capsule, Rfl: 0   fluticasone (FLONASE) 50 MCG/ACT nasal spray, Place 2 sprays into both nostrils daily., Disp: 16 g, Rfl: 2   levocetirizine (XYZAL) 5 MG tablet, Take 1 tablet (5 mg total) by mouth every evening., Disp: 30 tablet, Rfl: 11   linaclotide (LINZESS) 72 MCG capsule, Take 1 capsule (72 mcg total) by mouth daily before breakfast., Disp: 30 capsule, Rfl: 0   lubiprostone (AMITIZA) 24  MCG capsule, Take 1 capsule (24 mcg total) by mouth 2 (two) times daily with a meal., Disp: 60 capsule, Rfl: 3   montelukast (SINGULAIR) 10 MG tablet, TAKE 1 TABLET BY MOUTH AT BEDTIME, Disp: 90 tablet, Rfl: 0   Multiple Vitamin (MULTIVITAMIN) tablet, Take 1 tablet by mouth daily., Disp: , Rfl:    ondansetron (ZOFRAN) 4 MG tablet, TAKE 1 TABLET(4 MG) BY MOUTH EVERY 8 HOURS AS NEEDED FOR NAUSEA OR VOMITING, Disp: 20 tablet, Rfl: 0   ondansetron (ZOFRAN-ODT) 4 MG disintegrating tablet, Take 1 tablet (4 mg total) by mouth every 8 (eight) hours as needed., Disp: 20 tablet, Rfl: 0   polyethylene glycol powder (GLYCOLAX/MIRALAX) 17 GM/SCOOP powder, Take 17 g by mouth 2 (two) times daily as needed for moderate constipation., Disp: 255 g, Rfl: 0   Sod Fluoride-Potassium Nitrate (PREVIDENT 5000 SENSITIVE) 1.1-5 % PSTE, , Disp: , Rfl:    Medications ordered in this encounter:  No orders of the defined types were placed in this encounter.    *If you need refills on other medications prior to your next appointment, please contact your pharmacy*  Follow-Up: Call back or seek an in-person evaluation if the symptoms worsen or if the condition fails to improve as anticipated.  [facility] [phone]  Other Instructions Please take antibiotic as directed.  Increase fluid intake.  Use Saline nasal spray.  Take a daily multivitamin. Continue your allergy medications. Add on the Astelin as  directed.  Place a humidifier in the bedroom.  Please call or return clinic if symptoms are not improving.  Sinusitis Sinusitis is redness, soreness, and swelling (inflammation) of the paranasal sinuses. Paranasal sinuses are air pockets within the bones of your face (beneath the eyes, the middle of the forehead, or above the eyes). In healthy paranasal sinuses, mucus is able to drain out, and air is able to circulate through them by way of your nose. However, when your paranasal sinuses are inflamed, mucus and  air can become trapped. This can allow bacteria and other germs to grow and cause infection. Sinusitis can develop quickly and last only a short time (acute) or continue over a long period (chronic). Sinusitis that lasts for more than 12 weeks is considered chronic.  CAUSES  Causes of sinusitis include: Allergies. Structural abnormalities, such as displacement of the cartilage that separates your nostrils (deviated septum), which can decrease the air flow through your nose and sinuses and affect sinus drainage. Functional abnormalities, such as when the small hairs (cilia) that line your sinuses and help remove mucus do not work properly or are not present. SYMPTOMS  Symptoms of acute and chronic sinusitis are the same. The primary symptoms are pain and pressure around the affected sinuses. Other symptoms include: Upper toothache. Earache. Headache. Bad breath. Decreased sense of smell and taste. A cough, which worsens when you are lying flat. Fatigue. Fever. Thick drainage from your nose, which often is green and may contain pus (purulent). Swelling and warmth over the affected sinuses. DIAGNOSIS  Your caregiver will perform a physical exam. During the exam, your caregiver may: Look in your nose for signs of abnormal growths in your nostrils (nasal polyps). Tap over the affected sinus to check for signs of infection. View the inside of your sinuses (endoscopy) with a special imaging device with a light attached (endoscope), which is inserted into your sinuses. If your caregiver suspects that you have chronic sinusitis, one or more of the following tests may be recommended: Allergy tests. Nasal culture A sample of mucus is taken from your nose and sent to a lab and screened for bacteria. Nasal cytology A sample of mucus is taken from your nose and examined by your caregiver to determine if your sinusitis is related to an allergy. TREATMENT  Most cases of acute sinusitis are related to a  viral infection and will resolve on their own within 10 days. Sometimes medicines are prescribed to help relieve symptoms (pain medicine, decongestants, nasal steroid sprays, or saline sprays).  However, for sinusitis related to a bacterial infection, your caregiver will prescribe antibiotic medicines. These are medicines that will help kill the bacteria causing the infection.  Rarely, sinusitis is caused by a fungal infection. In theses cases, your caregiver will prescribe antifungal medicine. For some cases of chronic sinusitis, surgery is needed. Generally, these are cases in which sinusitis recurs more than 3 times per year, despite other treatments. HOME CARE INSTRUCTIONS  Drink plenty of water. Water helps thin the mucus so your sinuses can drain more easily. Use a humidifier. Inhale steam 3 to 4 times a day (for example, sit in the bathroom with the shower running). Apply a warm, moist washcloth to your face 3 to 4 times a day, or as directed by your caregiver. Use saline nasal sprays to help moisten and clean your sinuses. Take over-the-counter or prescription medicines for pain, discomfort, or fever only as directed by your caregiver. SEEK IMMEDIATE MEDICAL CARE IF: You have  increasing pain or severe headaches. You have nausea, vomiting, or drowsiness. You have swelling around your face. You have vision problems. You have a stiff neck. You have difficulty breathing. MAKE SURE YOU:  Understand these instructions. Will watch your condition. Will get help right away if you are not doing well or get worse. Document Released: 10/10/2005 Document Revised: 01/02/2012 Document Reviewed: 10/25/2011 [facility] Patient Information 2014 [place], [place].    If you have been instructed to have an in-person evaluation today at a local Urgent Care facility, please use the link below. It will take you to a list of all of our available [facility], including address, phone number and  hours of operation. Please do not delay care.  [facility]  If you or a family member do not have a primary care provider, use the link below to schedule a visit and establish care. When you choose a [facility] primary care physician or advanced practice provider, you gain a long-term partner in health. Find a Primary Care Provider  Learn more about [facility]'s in-office and virtual care options: [place] - Get Care Now

## 2023-07-22 ENCOUNTER — Other Ambulatory Visit: Payer: Self-pay | Admitting: Gastroenterology

## 2023-09-13 NOTE — Progress Notes (Unsigned)
    GYNECOLOGY PROGRESS NOTE  Subjective:    Patient ID: Ana Knapp, female    DOB: 1981-07-15, 42 y.o.   MRN: [id number]  HPI  Patient is a 42 y.o. G0P0000 female who presents for complaint of right sided pelvic pain.   {Common ambulatory SmartLinks:19316}  Review of Systems {ros; complete:30496}   Objective:   There were no vitals taken for this visit. There is no height or weight on file to calculate BMI. General appearance: {general exam:16600} Abdomen: {abdominal exam:16834} Pelvic: {pelvic exam:16852::"cervix normal in appearance","external genitalia normal","no adnexal masses or tenderness","no cervical motion tenderness","rectovaginal septum normal","uterus normal size, shape, and consistency","vagina normal without discharge"} Extremities: {extremity exam:5109} Neurologic: {neuro exam:17854}   Assessment:   No diagnosis found.   Plan:   There are no diagnoses linked to this encounter.

## 2023-09-14 ENCOUNTER — Other Ambulatory Visit (HOSPITAL_COMMUNITY)
Admission: RE | Admit: 2023-09-14 | Discharge: 2023-09-14 | Disposition: A | Discharge status: Home / Self Care | Payer: 59 | Source: Ambulatory Visit | Attending: Certified Nurse Midwife | Admitting: Certified Nurse Midwife

## 2023-09-14 ENCOUNTER — Encounter: Payer: Self-pay | Admitting: Certified Nurse Midwife

## 2023-09-14 ENCOUNTER — Telehealth: Payer: Self-pay | Admitting: Gastroenterology

## 2023-09-14 ENCOUNTER — Ambulatory Visit: Payer: 59 | Admitting: Certified Nurse Midwife

## 2023-09-14 VITALS — BP 128/72 | HR 90 | Wt 157.4 lb

## 2023-09-14 DIAGNOSIS — D259 Leiomyoma of uterus, unspecified: Secondary | ICD-10-CM

## 2023-09-14 DIAGNOSIS — R102 Pelvic and perineal pain: Secondary | ICD-10-CM | POA: Diagnosis not present

## 2023-09-14 DIAGNOSIS — Z86018 Personal history of other benign neoplasm: Secondary | ICD-10-CM

## 2023-09-14 DIAGNOSIS — N898 Other specified noninflammatory disorders of vagina: Secondary | ICD-10-CM

## 2023-09-14 DIAGNOSIS — R1031 Right lower quadrant pain: Secondary | ICD-10-CM

## 2023-09-14 DIAGNOSIS — Z8742 Personal history of other diseases of the female genital tract: Secondary | ICD-10-CM

## 2023-09-14 DIAGNOSIS — D219 Benign neoplasm of connective and other soft tissue, unspecified: Secondary | ICD-10-CM | POA: Insufficient documentation

## 2023-09-14 DIAGNOSIS — Z3202 Encounter for pregnancy test, result negative: Secondary | ICD-10-CM | POA: Diagnosis not present

## 2023-09-14 LAB — POCT URINALYSIS DIPSTICK
Bilirubin, UA: NEGATIVE
Blood, UA: NEGATIVE
Glucose, UA: NEGATIVE
Ketones, UA: NEGATIVE
Leukocytes, UA: NEGATIVE
Nitrite, UA: NEGATIVE
Protein, UA: NEGATIVE
Spec Grav, UA: 1.01 (ref 1.010–1.025)
Urobilinogen, UA: 0.2 U/dL
pH, UA: 7.5 (ref 5.0–8.0)

## 2023-09-14 LAB — POCT URINE PREGNANCY: Preg Test, Ur: NEGATIVE

## 2023-09-14 MED ORDER — NORETHINDRONE 0.35 MG PO TABS: 1.0000 | ORAL_TABLET | Freq: Every day | ORAL | 11 refills | Status: DC

## 2023-09-14 NOTE — Telephone Encounter (Signed)
Patient called in for a refill on (Nexium) and she needs some medicine that helps her to go to the bathroom. Please call patient for advice. She is unable to eat because of her [employer].

## 2023-09-14 NOTE — Patient Instructions (Signed)
Pelvic Pain, Female Pelvic pain is pain in your lower belly (abdomen), below your belly button and between your hips. The pain may: Start all of a sudden (be acute). Keep coming back (be recurring). Last a long time (become chronic). Pelvic pain that lasts longer than 6 months is called chronic pelvic pain. There are many causes of pelvic pain. Sometimes the cause of pelvic pain is not known. Follow these instructions at home:  Take over-the-counter and prescription medicines only as told by your doctor. Rest as told by your doctor. Do not have sex if it hurts. Keep a journal of your pelvic pain. Write down: When the pain started. Where the pain is located. What seems to make the pain better or worse, such as food or your monthly period (menstrual cycle). Any symptoms you have along with the pain. Keep all follow-up visits. Contact a doctor if: Medicine does not help your pain, or your pain comes back. You have new symptoms. You have unusual discharge or bleeding from your vagina. You have a fever or chills. You are having trouble pooping (constipation). You have blood in your pee (urine) or poop (stool). Your pee smells bad. You feel weak or light-headed. Get help right away if: You have sudden pain that is very bad. You have very bad pain and also have any of these symptoms: A fever. Feeling like you may vomit (nauseous). Vomiting. Being very sweaty. You faint. These symptoms may be an emergency. Get help right away. Call your local emergency services (911 in the [place] Do not wait to see if the symptoms will go away. Do not drive yourself to the hospital. Summary Pelvic pain is pain in your lower belly (abdomen), below your belly button and between your hips. There are many causes of pelvic pain. Keep a journal of your pelvic pain. This information is not intended to replace advice given to you by your health care provider. Make sure you discuss any questions you have with  your health care provider. Document Revised: 02/16/2021 Document Reviewed: 02/16/2021 Elsevier Patient Education  2024 Elsevier Inc.  

## 2023-09-18 ENCOUNTER — Other Ambulatory Visit: Payer: Self-pay | Admitting: Certified Nurse Midwife

## 2023-09-18 ENCOUNTER — Encounter: Payer: Self-pay | Admitting: Certified Nurse Midwife

## 2023-09-18 LAB — CERVICOVAGINAL ANCILLARY ONLY
Bacterial Vaginitis (gardnerella): NEGATIVE
Candida Glabrata: NEGATIVE
Candida Vaginitis: POSITIVE — AB
Comment: NEGATIVE
Comment: NEGATIVE
Comment: NEGATIVE

## 2023-09-18 MED ORDER — FLUCONAZOLE 150 MG PO TABS: 150.0000 mg | ORAL_TABLET | Freq: Once | ORAL | 0 refills | Status: AC

## 2023-09-19 ENCOUNTER — Telehealth: Payer: Self-pay

## 2023-09-19 MED ORDER — LUBIPROSTONE 24 MCG PO CAPS: 24.0000 ug | ORAL_CAPSULE | Freq: Two times a day (BID) | ORAL | 1 refills | Status: DC

## 2023-09-19 NOTE — Telephone Encounter (Signed)
Pt is aware.

## 2023-09-19 NOTE — Telephone Encounter (Signed)
Pt came into the office complaining of not having BM x 4-5 weeks... Pt has not been taking Amitizia, had only tried a few days when prescribed previously... Pt has been taking Miralax with minimal relief... I told pt I would refill Amitizia in the meantime just in case I do not get a response in the meantime as she is leaving to go out of town tomorrow...   Please advise

## 2023-09-20 ENCOUNTER — Ambulatory Visit
Admission: RE | Admit: 2023-09-20 | Discharge: 2023-09-20 | Disposition: A | Discharge status: Home / Self Care | Payer: 59 | Source: Ambulatory Visit | Attending: Certified Nurse Midwife | Admitting: Certified Nurse Midwife

## 2023-09-20 DIAGNOSIS — Z8742 Personal history of other diseases of the female genital tract: Secondary | ICD-10-CM | POA: Diagnosis present

## 2023-09-20 DIAGNOSIS — Z86018 Personal history of other benign neoplasm: Secondary | ICD-10-CM | POA: Insufficient documentation

## 2023-09-20 DIAGNOSIS — R102 Pelvic and perineal pain: Secondary | ICD-10-CM | POA: Diagnosis present

## 2023-09-20 DIAGNOSIS — R1031 Right lower quadrant pain: Secondary | ICD-10-CM | POA: Insufficient documentation

## 2023-09-23 ENCOUNTER — Encounter: Payer: Self-pay | Admitting: Certified Nurse Midwife

## 2023-09-23 ENCOUNTER — Other Ambulatory Visit: Payer: Self-pay | Admitting: Gastroenterology

## 2023-09-28 ENCOUNTER — Ambulatory Visit (INDEPENDENT_AMBULATORY_CARE_PROVIDER_SITE_OTHER): Payer: 59 | Admitting: Gastroenterology

## 2023-09-28 ENCOUNTER — Encounter: Payer: Self-pay | Admitting: Gastroenterology

## 2023-09-28 VITALS — BP 123/72 | HR 96 | Temp 98.5°F | Wt 160.0 lb

## 2023-09-28 DIAGNOSIS — K5909 Other constipation: Secondary | ICD-10-CM | POA: Diagnosis not present

## 2023-09-28 DIAGNOSIS — K219 Gastro-esophageal reflux disease without esophagitis: Secondary | ICD-10-CM

## 2023-09-28 DIAGNOSIS — K5900 Constipation, unspecified: Secondary | ICD-10-CM

## 2023-09-28 NOTE — Progress Notes (Signed)
Primary Care Physician: Alba Cory, MD  Primary Gastroenterologist:  Dr. Midge Minium  Chief Complaint  Patient presents with   Follow-up    HPI: Ana Knapp is a 42 y.o. female here for follow-up after seeing me in October of last year for constipation and GERD.  The patient had been started on MiraLAX and was taking Nexium.  The patient had called last month for refill of medications and continued constipation. The patient reports that she tried the Amitiza and it has not been working for her although she takes it every day and states that the MiraLAX has also seem to be less effective unless she stops it for few days and then started again whereupon it becomes more effective.  The patient had diarrhea with Linzess 145 mcg/day.  Past Medical History:  Diagnosis Date   Chronic sinusitis    Fibromyalgia    GERD (gastroesophageal reflux disease)    Headache    sinus   History of melanoma 11/22/2017   History of melanoma 2018   Back   May-Thurner syndrome 11/30/2015   January 2017; 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[facility]    PONV (postoperative nausea and vomiting)    Pulmonary embolism (HCC)     Current Outpatient Medications  Medication Sig Dispense Refill   acetaminophen (TYLENOL) 325 MG tablet Take 650 mg by mouth every 6 (six) hours as needed.     aspirin 81 MG EC tablet Take 1 tablet by mouth daily.     azelastine (ASTELIN) 0.1 % nasal spray Place 1 spray into both nostrils 2 (two) times daily. Use in each nostril as directed 30 mL 0   Chlorpheniramine-PSE-Ibuprofen (ADVIL ALLERGY SINUS PO) Take by mouth.     esomeprazole (NEXIUM) 40 MG capsule TAKE 1 CAPSULE (40 MG TOTAL) BY MOUTH 2 (TWO) TIMES DAILY BEFORE A MEAL. SCHEDULE OFFICE VISIT 180 capsule 0   fluticasone (FLONASE) 50 MCG/ACT nasal spray Place 2 sprays into both nostrils daily. 16 g 2   levocetirizine (XYZAL) 5 MG tablet Take 1 tablet (5 mg total) by mouth every evening. 30 tablet 11   lubiprostone (AMITIZA) 24 MCG  capsule Take 1 capsule (24 mcg total) by mouth 2 (two) times daily with a meal. 60 capsule 1   montelukast (SINGULAIR) 10 MG tablet TAKE 1 TABLET BY MOUTH AT BEDTIME 90 tablet 0   Multiple Vitamin (MULTIVITAMIN) tablet Take 1 tablet by mouth daily.     norethindrone (MICRONOR) 0.35 MG tablet Take 1 tablet (0.35 mg total) by mouth daily. 30 tablet 11   ondansetron (ZOFRAN) 4 MG tablet TAKE 1 TABLET(4 MG) BY MOUTH EVERY 8 HOURS AS NEEDED FOR NAUSEA OR VOMITING 20 tablet 0   polyethylene glycol powder (GLYCOLAX/MIRALAX) 17 GM/SCOOP powder Take 17 g by mouth 2 (two) times daily as needed for moderate constipation. 255 g 0   Sod Fluoride-Potassium Nitrate (PREVIDENT 5000 SENSITIVE) 1.1-5 % PSTE      No current facility-administered medications for this visit.    Allergies as of 09/28/2023 - Review Complete 09/28/2023  Allergen Reaction Noted   Ortho tri-cyclen [norgestimate-eth estradiol] Other (See Comments) 11/30/2015   Prednisone Anaphylaxis 11/12/2015   Augmentin [amoxicillin-pot clavulanate] Other (See Comments) 08/26/2015   Doxycycline  09/13/2021    ROS:  General: Negative for anorexia, weight loss, fever, chills, fatigue, weakness. ENT: Negative for hoarseness, difficulty swallowing , nasal congestion. CV: Negative for chest pain, angina, palpitations, dyspnea on exertion, peripheral edema.  Respiratory: Negative for dyspnea at rest, dyspnea on  exertion, cough, sputum, wheezing.  GI: See history of present illness. GU:  Negative for dysuria, hematuria, urinary incontinence, urinary frequency, nocturnal urination.  Endo: Negative for unusual weight change.    Physical Examination:   BP 123/72 (BP Location: Left Arm, Patient Position: Sitting, Cuff Size: Normal)   Pulse 96   Temp 98.5 F (36.9 C) (Oral)   Wt 160 lb (72.6 kg)   LMP  (Within Weeks)   BMI 23.63 kg/m   General: Well-nourished, well-developed in no acute distress.  Eyes: No icterus. Conjunctivae pink. Neuro:  Alert and oriented x 3.  Grossly intact. Skin: Warm and dry, no jaundice.   Psych: Alert and cooperative, normal mood and affect.  Labs:    Imaging Studies: US PELVIC COMPLETE WITH TRANSVAGINAL  Result Date: 09/20/2023 CLINICAL DATA:  Right lower quadrant pain EXAM: TRANSABDOMINAL AND TRANSVAGINAL ULTRASOUND OF PELVIS TECHNIQUE: Both transabdominal and transvaginal ultrasound examinations of the pelvis were performed. Transabdominal technique was performed for global imaging of the pelvis including uterus, ovaries, adnexal regions, and pelvic cul-de-sac. It was necessary to proceed with endovaginal exam following the transabdominal exam to visualize the adnexal structures. COMPARISON:  CT abdomen pelvis 07/06/2021 FINDINGS: Uterus Measurements: 8.2 x 4.3 x 4.7 cm = volume: 88 mL. There is a 2.8 x 2.6 x 2.9 cm subserosal fibroid along the right uterine fundus Endometrium Thickness: 17 mm.  No focal abnormality visualized. Right ovary Measurements: 2.5 x 1.0 x 1.2 cm = volume: 1.6 mL. Normal appearance/no adnexal mass. Left ovary Measurements: 2.1 x 1.3 x 1.7 cm = volume: 2.5 mL. Normal appearance/no adnexal mass. Other findings No abnormal free fluid. IMPRESSION: 1. No acute process within the pelvis. 2. Subserosal fibroid along the right uterine fundus. 3. Endometrium measures 17 mm. Endometrial thickness is considered abnormal. Consider follow-up by [place] in 6-8 weeks, during the week immediately following menses (exam timing is critical). Electronically Signed   By: Annia Belt M.D.   On: 09/20/2023 22:30    Assessment and Plan:   Ana Knapp is a 42 y.o. y/o female who comes in today with a history of chronic constipation.  The patient's present medications have not been working for her to keep her going on a regular basis.  She is only having bowel movements approximately 2 days a week.  She is taking the Amitiza 24 mcg twice a day with MiraLAX.  The patient has been given samples of Linzess  72 mcg to be taken daily with adding Linzess as needed and she has also been told that if this does not work that she may add milk of magnesia.  The patient will contact me if the medication is working.     Midge Minium, MD. Clementeen Graham    Note: This dictation was prepared with Dragon dictation along with smaller phrase technology. Any transcriptional errors that result from this process are unintentional.

## 2023-10-07 ENCOUNTER — Other Ambulatory Visit: Payer: Self-pay | Admitting: Gastroenterology

## 2023-10-07 ENCOUNTER — Telehealth: Payer: 59 | Admitting: Physician Assistant

## 2023-10-07 DIAGNOSIS — R110 Nausea: Secondary | ICD-10-CM | POA: Diagnosis not present

## 2023-10-07 DIAGNOSIS — B9689 Other specified bacterial agents as the cause of diseases classified elsewhere: Secondary | ICD-10-CM | POA: Diagnosis not present

## 2023-10-07 DIAGNOSIS — J019 Acute sinusitis, unspecified: Secondary | ICD-10-CM | POA: Diagnosis not present

## 2023-10-07 MED ORDER — AZITHROMYCIN 250 MG PO TABS: ORAL_TABLET | ORAL | 0 refills | Status: AC

## 2023-10-07 MED ORDER — ONDANSETRON HCL 4 MG PO TABS: 4.0000 mg | ORAL_TABLET | Freq: Three times a day (TID) | ORAL | 0 refills | Status: DC | PRN

## 2023-10-07 NOTE — Progress Notes (Signed)
Virtual Visit Consent   Ana Knapp, you are scheduled for a virtual visit with a [facility] provider today. Just as with appointments in the office, your consent must be obtained to participate. Your consent will be active for this visit and any virtual visit you may have with one of our providers in the next 365 days. If you have a MyChart account, a copy of this consent can be sent to you electronically.  As this is a virtual visit, video technology does not allow for your provider to perform a traditional examination. This may limit your provider's ability to fully assess your condition. If your provider identifies any concerns that need to be evaluated in person or the need to arrange testing (such as labs, EKG, etc.), we will make arrangements to do so. Although advances in technology are sophisticated, we cannot ensure that it will always work on either your end or our end. If the connection with a video visit is poor, the visit may have to be switched to a telephone visit. With either a video or telephone visit, we are not always able to ensure that we have a secure connection.  By engaging in this virtual visit, you consent to the provision of healthcare and authorize for your insurance to be billed (if applicable) for the services provided during this visit. Depending on your insurance coverage, you may receive a charge related to this service.  I need to obtain your verbal consent now. Are you willing to proceed with your visit today? Ana Knapp has provided verbal consent on 10/07/2023 for a virtual visit (video or telephone). Ana Knapp  Date: 10/07/2023 1:12 PM  Virtual Visit via Video Note   I, Ana Knapp, [name]  ([id number], [birth year]) on 10/07/23 at  1:15 PM EST by a video-enabled telemedicine application and verified that I am speaking with the correct person using two identifiers.  Location: Patient: Virtual Visit  Location Patient: Home Provider: Virtual Visit Location Provider: Home Office   I discussed the limitations of evaluation and management by telemedicine and the availability of in person appointments. The patient expressed understanding and agreed to proceed.    History of Present Illness: Ana Knapp is a 42 y.o. who identifies as a female who was assigned female at birth, and is being seen today for sinus pressure and pain that started about one month ago, was starting to improve, and became much worse over the last week. Reports mild intermittent ear pain/pressure.  She is taking Mucinex, allergy medication with minimal relief.  Denies cough.  Reports she is feeling nauseated due to postnasal drip, requesting zofran.  HPI: HPI  Problems:  Patient Active Problem List   Diagnosis Date Noted   Fibroid 09/14/2023   Pelvic pain 05/10/2022   Endometriosis 03/01/2018   Abnormal CT scan    History of melanoma 11/22/2017   Biliary dyskinesia 12/05/2016   Constipation 08/18/2016   May-Thurner syndrome 11/30/2015   DVT (deep venous thrombosis) (HCC) 11/13/2015   History of pulmonary embolism 11/13/2015   GERD (gastroesophageal reflux disease) 09/28/2015   Flushing 09/24/2015   Fibromyalgia 08/30/2015   Seasonal allergic rhinitis 08/30/2015   Fibrositis 08/30/2015    Allergies:  Allergies  Allergen Reactions   Ortho Tri-Cyclen [Norgestimate-Eth Estradiol] Other (See Comments)    Blood clot and PE   Prednisone Anaphylaxis   Augmentin [Amoxicillin-Pot Clavulanate] Other (See Comments)    Upsets her stomach   Doxycycline  Nausea, diarrhea   Medications:  Current Outpatient Medications:    azithromycin (ZITHROMAX) 250 MG tablet, Take 2 tablets on day 1, then 1 tablet daily on days 2 through 5, Disp: 6 tablet, Rfl: 0   ondansetron (ZOFRAN) 4 MG tablet, Take 1 tablet (4 mg total) by mouth every 8 (eight) hours as needed for nausea or vomiting., Disp: 20 tablet, Rfl: 0    acetaminophen (TYLENOL) 325 MG tablet, Take 650 mg by mouth every 6 (six) hours as needed., Disp: , Rfl:    aspirin 81 MG EC tablet, Take 1 tablet by mouth daily., Disp: , Rfl:    azelastine (ASTELIN) 0.1 % nasal spray, Place 1 spray into both nostrils 2 (two) times daily. Use in each nostril as directed, Disp: 30 mL, Rfl: 0   Chlorpheniramine-PSE-Ibuprofen (ADVIL ALLERGY SINUS PO), Take by mouth., Disp: , Rfl:    esomeprazole (NEXIUM) 40 MG capsule, TAKE 1 CAPSULE (40 MG TOTAL) BY MOUTH 2 (TWO) TIMES DAILY BEFORE A MEAL. SCHEDULE OFFICE VISIT, Disp: 180 capsule, Rfl: 0   fluticasone (FLONASE) 50 MCG/ACT nasal spray, Place 2 sprays into both nostrils daily., Disp: 16 g, Rfl: 2   levocetirizine (XYZAL) 5 MG tablet, Take 1 tablet (5 mg total) by mouth every evening., Disp: 30 tablet, Rfl: 11   lubiprostone (AMITIZA) 24 MCG capsule, Take 1 capsule (24 mcg total) by mouth 2 (two) times daily with a meal., Disp: 60 capsule, Rfl: 1   montelukast (SINGULAIR) 10 MG tablet, TAKE 1 TABLET BY MOUTH AT BEDTIME, Disp: 90 tablet, Rfl: 0   Multiple Vitamin (MULTIVITAMIN) tablet, Take 1 tablet by mouth daily., Disp: , Rfl:    norethindrone (MICRONOR) 0.35 MG tablet, Take 1 tablet (0.35 mg total) by mouth daily., Disp: 30 tablet, Rfl: 11   polyethylene glycol powder (GLYCOLAX/MIRALAX) 17 GM/SCOOP powder, Take 17 g by mouth 2 (two) times daily as needed for moderate constipation., Disp: 255 g, Rfl: 0   Sod Fluoride-Potassium Nitrate (PREVIDENT 5000 SENSITIVE) 1.1-5 % PSTE, , Disp: , Rfl:   Observations/Objective: Patient is well-developed, well-nourished in no acute distress.  Resting comfortably at home.  Head is normocephalic, atraumatic.  No labored breathing.  Speech is clear and coherent with logical content.  Patient is alert and oriented at baseline.    Assessment and Plan: 1. Acute bacterial sinusitis (Primary) - azithromycin (ZITHROMAX) 250 MG tablet; Take 2 tablets on day 1, then 1 tablet daily on  days 2 through 5  Dispense: 6 tablet; Refill: 0  2. Nausea - ondansetron (ZOFRAN) 4 MG tablet; Take 1 tablet (4 mg total) by mouth every 8 (eight) hours as needed for nausea or vomiting.  Dispense: 20 tablet; Refill: 0  Supportive care discussed.  In person evaluation precautions discussed. Pt stable, in no acute distress.   Follow Up Instructions: I discussed the assessment and treatment plan with the patient. The patient was provided an opportunity to ask questions and all were answered. The patient agreed with the plan and demonstrated an understanding of the instructions.  A copy of instructions were sent to the patient via MyChart unless otherwise noted below.     The patient was advised to call back or seek an in-person evaluation if the symptoms worsen or if the condition fails to improve as anticipated.    Ana Knapp

## 2023-10-07 NOTE — Patient Instructions (Signed)
Ana Knapp, thank you for joining Tylene Fantasia Ward, PA-C for today's virtual visit.  While this provider is not your primary care provider (PCP), if your PCP is located in our provider database this encounter information will be shared with them immediately following your visit.   A [facility] MyChart account gives you access to today's visit and all your visits, tests, and labs performed at [facility] " click here if you don't have a [facility] MyChart account or go to [email or web address]  Consent: (Patient) Ana Knapp provided verbal consent for this virtual visit at the beginning of the encounter.  Current Medications:  Current Outpatient Medications:    azithromycin (ZITHROMAX) 250 MG tablet, Take 2 tablets on day 1, then 1 tablet daily on days 2 through 5, Disp: 6 tablet, Rfl: 0   ondansetron (ZOFRAN) 4 MG tablet, Take 1 tablet (4 mg total) by mouth every 8 (eight) hours as needed for nausea or vomiting., Disp: 20 tablet, Rfl: 0   acetaminophen (TYLENOL) 325 MG tablet, Take 650 mg by mouth every 6 (six) hours as needed., Disp: , Rfl:    aspirin 81 MG EC tablet, Take 1 tablet by mouth daily., Disp: , Rfl:    azelastine (ASTELIN) 0.1 % nasal spray, Place 1 spray into both nostrils 2 (two) times daily. Use in each nostril as directed, Disp: 30 mL, Rfl: 0   Chlorpheniramine-PSE-Ibuprofen (ADVIL ALLERGY SINUS PO), Take by mouth., Disp: , Rfl:    esomeprazole (NEXIUM) 40 MG capsule, TAKE 1 CAPSULE (40 MG TOTAL) BY MOUTH 2 (TWO) TIMES DAILY BEFORE A MEAL. SCHEDULE OFFICE VISIT, Disp: 180 capsule, Rfl: 0   fluticasone (FLONASE) 50 MCG/ACT nasal spray, Place 2 sprays into both nostrils daily., Disp: 16 g, Rfl: 2   levocetirizine (XYZAL) 5 MG tablet, Take 1 tablet (5 mg total) by mouth every evening., Disp: 30 tablet, Rfl: 11   lubiprostone (AMITIZA) 24 MCG capsule, Take 1 capsule (24 mcg total) by mouth 2 (two) times daily with a meal., Disp: 60 capsule, Rfl: 1    montelukast (SINGULAIR) 10 MG tablet, TAKE 1 TABLET BY MOUTH AT BEDTIME, Disp: 90 tablet, Rfl: 0   Multiple Vitamin (MULTIVITAMIN) tablet, Take 1 tablet by mouth daily., Disp: , Rfl:    norethindrone (MICRONOR) 0.35 MG tablet, Take 1 tablet (0.35 mg total) by mouth daily., Disp: 30 tablet, Rfl: 11   polyethylene glycol powder (GLYCOLAX/MIRALAX) 17 GM/SCOOP powder, Take 17 g by mouth 2 (two) times daily as needed for moderate constipation., Disp: 255 g, Rfl: 0   Sod Fluoride-Potassium Nitrate (PREVIDENT 5000 SENSITIVE) 1.1-5 % PSTE, , Disp: , Rfl:    Medications ordered in this encounter:  Meds ordered this encounter  Medications   azithromycin (ZITHROMAX) 250 MG tablet    Sig: Take 2 tablets on day 1, then 1 tablet daily on days 2 through 5    Dispense:  6 tablet    Refill:  0    Supervising Provider:   Merrilee Jansky [[id number]]   ondansetron (ZOFRAN) 4 MG tablet    Sig: Take 1 tablet (4 mg total) by mouth every 8 (eight) hours as needed for nausea or vomiting.    Dispense:  20 tablet    Refill:  0    Supervising Provider:   Merrilee Jansky [[id number]]     *If you need refills on other medications prior to your next appointment, please contact your pharmacy*  Follow-Up: Call back or seek an in-person evaluation if  the symptoms worsen or if the condition fails to improve as anticipated.  [facility] [phone]  Other Instructions Taken azithromycin as prescribed.  Recommend continuing with Mucinex and Flonase.  Can take Ibuprofen or Tylenol as needed.  Rest, drink plenty of fluids.    If you have been instructed to have an in-person evaluation today at a local Urgent Care facility, please use the link below. It will take you to a list of all of our available [facility], including address, phone number and hours of operation. Please do not delay care.  [facility]  If you or a family member do not have a primary care provider, use the  link below to schedule a visit and establish care. When you choose a [facility] primary care physician or advanced practice provider, you gain a long-term partner in health. Find a Primary Care Provider  Learn more about [facility]'s in-office and virtual care options:  - Get Care Now

## 2023-10-08 ENCOUNTER — Telehealth: Payer: 59 | Admitting: Family

## 2023-10-08 DIAGNOSIS — H109 Unspecified conjunctivitis: Secondary | ICD-10-CM

## 2023-10-08 MED ORDER — POLYMYXIN B-TRIMETHOPRIM 10000-0.1 UNIT/ML-% OP SOLN: 1.0000 [drp] | Freq: Four times a day (QID) | OPHTHALMIC | 0 refills | Status: DC

## 2023-10-08 NOTE — Progress Notes (Signed)
Virtual Visit Consent   Ana Knapp, you are scheduled for a virtual visit with a [facility] provider today. Just as with appointments in the office, your consent must be obtained to participate. Your consent will be active for this visit and any virtual visit you may have with one of our providers in the next 365 days. If you have a MyChart account, a copy of this consent can be sent to you electronically.  As this is a virtual visit, video technology does not allow for your provider to perform a traditional examination. This may limit your provider's ability to fully assess your condition. If your provider identifies any concerns that need to be evaluated in person or the need to arrange testing (such as labs, EKG, etc.), we will make arrangements to do so. Although advances in technology are sophisticated, we cannot ensure that it will always work on either your end or our end. If the connection with a video visit is poor, the visit may have to be switched to a telephone visit. With either a video or telephone visit, we are not always able to ensure that we have a secure connection.  By engaging in this virtual visit, you consent to the provision of healthcare and authorize for your insurance to be billed (if applicable) for the services provided during this visit. Depending on your insurance coverage, you may receive a charge related to this service.  I need to obtain your verbal consent now. Are you willing to proceed with your visit today? Ana Knapp has provided verbal consent on 10/08/2023 for a virtual visit (video or telephone). Ana Rodney, FNP  Date: 10/08/2023 7:44 PM  Virtual Visit via Video Note   I, Ana Knapp, [name]  ([id number], October 18, 1981) on 10/08/23 at  7:45 PM EST by a video-enabled telemedicine application and verified that I am speaking with the correct person using two identifiers.  Location: Patient: Virtual Visit  Location Patient: [place] Provider: Virtual Visit Location Provider: Home Office   I discussed the limitations of evaluation and management by telemedicine and the availability of in person appointments. The patient expressed understanding and agreed to proceed.    History of Present Illness: Ana Knapp is a 42 y.o. who identifies as a female who was assigned female at birth, and is being seen today for eye redness and green/yellow discharge that started this morning.  HPI: Conjunctivitis  The current episode started today. The onset was sudden. The problem occurs continuously. The problem has been gradually worsening. Associated symptoms include decreased vision ("blurry"), eye itching, congestion, headaches, rhinorrhea, eye discharge and eye redness. Pertinent negatives include no photophobia and no eye pain. Both eyes are affected.    Problems:  Patient Active Problem List   Diagnosis Date Noted   Fibroid 09/14/2023   Pelvic pain 05/10/2022   Endometriosis 03/01/2018   Abnormal CT scan    History of melanoma 11/22/2017   Biliary dyskinesia 12/05/2016   Constipation 08/18/2016   May-Thurner syndrome 11/30/2015   DVT (deep venous thrombosis) (HCC) 11/13/2015   History of pulmonary embolism 11/13/2015   GERD (gastroesophageal reflux disease) 09/28/2015   Flushing 09/24/2015   Fibromyalgia 08/30/2015   Seasonal allergic rhinitis 08/30/2015   Fibrositis 08/30/2015    Allergies:  Allergies  Allergen Reactions   Ortho Tri-Cyclen [Norgestimate-Eth Estradiol] Other (See Comments)    Blood clot and PE   Prednisone Anaphylaxis   Augmentin [Amoxicillin-Pot Clavulanate] Other (See Comments)  Upsets her stomach   Doxycycline     Nausea, diarrhea   Medications:  Current Outpatient Medications:    trimethoprim-polymyxin b (POLYTRIM) ophthalmic solution, Place 1 drop into both eyes every 6 (six) hours., Disp: 10 mL, Rfl: 0   acetaminophen (TYLENOL) 325 MG tablet, Take 650 mg  by mouth every 6 (six) hours as needed., Disp: , Rfl:    aspirin 81 MG EC tablet, Take 1 tablet by mouth daily., Disp: , Rfl:    azelastine (ASTELIN) 0.1 % nasal spray, Place 1 spray into both nostrils 2 (two) times daily. Use in each nostril as directed, Disp: 30 mL, Rfl: 0   azithromycin (ZITHROMAX) 250 MG tablet, Take 2 tablets on day 1, then 1 tablet daily on days 2 through 5, Disp: 6 tablet, Rfl: 0   Chlorpheniramine-PSE-Ibuprofen (ADVIL ALLERGY SINUS PO), Take by mouth., Disp: , Rfl:    esomeprazole (NEXIUM) 40 MG capsule, TAKE 1 CAPSULE (40 MG TOTAL) BY MOUTH 2 (TWO) TIMES DAILY BEFORE A MEAL. SCHEDULE OFFICE VISIT, Disp: 180 capsule, Rfl: 0   fluticasone (FLONASE) 50 MCG/ACT nasal spray, Place 2 sprays into both nostrils daily., Disp: 16 g, Rfl: 2   levocetirizine (XYZAL) 5 MG tablet, Take 1 tablet (5 mg total) by mouth every evening., Disp: 30 tablet, Rfl: 11   lubiprostone (AMITIZA) 24 MCG capsule, Take 1 capsule (24 mcg total) by mouth 2 (two) times daily with a meal., Disp: 60 capsule, Rfl: 1   montelukast (SINGULAIR) 10 MG tablet, TAKE 1 TABLET BY MOUTH AT BEDTIME, Disp: 90 tablet, Rfl: 0   Multiple Vitamin (MULTIVITAMIN) tablet, Take 1 tablet by mouth daily., Disp: , Rfl:    norethindrone (MICRONOR) 0.35 MG tablet, Take 1 tablet (0.35 mg total) by mouth daily., Disp: 30 tablet, Rfl: 11   ondansetron (ZOFRAN) 4 MG tablet, Take 1 tablet (4 mg total) by mouth every 8 (eight) hours as needed for nausea or vomiting., Disp: 20 tablet, Rfl: 0   polyethylene glycol powder (GLYCOLAX/MIRALAX) 17 GM/SCOOP powder, Take 17 g by mouth 2 (two) times daily as needed for moderate constipation., Disp: 255 g, Rfl: 0   Sod Fluoride-Potassium Nitrate (PREVIDENT 5000 SENSITIVE) 1.1-5 % PSTE, , Disp: , Rfl:   Observations/Objective: Patient is well-developed, well-nourished in no acute distress.  Resting comfortably  at home.  Head is normocephalic, atraumatic.  No labored breathing.  Speech is clear and  coherent with logical content.  Patient is alert and oriented at baseline.  Eyes redness and discharge  Assessment and Plan: 1. Bacterial conjunctivitis (Primary) - trimethoprim-polymyxin b (POLYTRIM) ophthalmic solution; Place 1 drop into both eyes every 6 (six) hours.  Dispense: 10 mL; Refill: 0  Avoid rubbing eye Warm compress  Good hand hygiene  Follow up if symptoms worsen or do not improve   Follow Up Instructions: I discussed the assessment and treatment plan with the patient. The patient was provided an opportunity to ask questions and all were answered. The patient agreed with the plan and demonstrated an understanding of the instructions.  A copy of instructions were sent to the patient via MyChart unless otherwise noted below.    The patient was advised to call back or seek an in-person evaluation if the symptoms worsen or if the condition fails to improve as anticipated.    Ana Rodney, FNP

## 2023-10-10 ENCOUNTER — Encounter: Payer: Self-pay | Admitting: Gastroenterology

## 2023-10-11 MED ORDER — ESOMEPRAZOLE MAGNESIUM 40 MG PO CPDR: 40.0000 mg | DELAYED_RELEASE_CAPSULE | Freq: Two times a day (BID) | ORAL | 2 refills | Status: DC

## 2023-10-11 MED ORDER — LINACLOTIDE 72 MCG PO CAPS: 72.0000 ug | ORAL_CAPSULE | Freq: Every day | ORAL | 2 refills | Status: DC

## 2023-10-11 NOTE — Addendum Note (Signed)
Addended by: Roena Malady on: 10/11/2023 01:30 PM   Modules accepted: Orders

## 2023-12-22 ENCOUNTER — Telehealth: Payer: 59 | Admitting: Physician Assistant

## 2023-12-22 DIAGNOSIS — J329 Chronic sinusitis, unspecified: Secondary | ICD-10-CM | POA: Diagnosis not present

## 2023-12-22 MED ORDER — AZITHROMYCIN 500 MG PO TABS: ORAL_TABLET | ORAL | 0 refills | Status: DC

## 2023-12-22 MED ORDER — BENZONATATE 100 MG PO CAPS: 100.0000 mg | ORAL_CAPSULE | Freq: Three times a day (TID) | ORAL | 0 refills | Status: AC

## 2023-12-22 NOTE — Patient Instructions (Signed)
 Lenise Arena, thank you for joining Karrie Meres, PA-C for today's virtual visit.  While this provider is not your primary care provider (PCP), if your PCP is located in our provider database this encounter information will be shared with them immediately following your visit.   A [facility] MyChart account gives you access to today's visit and all your visits, tests, and labs performed at [facility] " click here if you don't have a [facility] MyChart account or go to [email or web address]  Consent: (Patient) Ana Knapp provided verbal consent for this virtual visit at the beginning of the encounter.  Current Medications:  Current Outpatient Medications:    azithromycin (ZITHROMAX) 500 MG tablet, Take one tablet daily for 3 days, Disp: 3 tablet, Rfl: 0   benzonatate (TESSALON) 100 MG capsule, Take 1 capsule (100 mg total) by mouth every 8 (eight) hours for 5 days., Disp: 15 capsule, Rfl: 0   acetaminophen (TYLENOL) 325 MG tablet, Take 650 mg by mouth every 6 (six) hours as needed., Disp: , Rfl:    aspirin 81 MG EC tablet, Take 1 tablet by mouth daily., Disp: , Rfl:    azelastine (ASTELIN) 0.1 % nasal spray, Place 1 spray into both nostrils 2 (two) times daily. Use in each nostril as directed, Disp: 30 mL, Rfl: 0   Chlorpheniramine-PSE-Ibuprofen (ADVIL ALLERGY SINUS PO), Take by mouth., Disp: , Rfl:    esomeprazole (NEXIUM) 40 MG capsule, Take 1 capsule (40 mg total) by mouth 2 (two) times daily before a meal., Disp: 180 capsule, Rfl: 2   fluticasone (FLONASE) 50 MCG/ACT nasal spray, Place 2 sprays into both nostrils daily., Disp: 16 g, Rfl: 2   levocetirizine (XYZAL) 5 MG tablet, Take 1 tablet (5 mg total) by mouth every evening., Disp: 30 tablet, Rfl: 11   linaclotide (LINZESS) 72 MCG capsule, Take 1 capsule (72 mcg total) by mouth daily before breakfast., Disp: 90 capsule, Rfl: 2   montelukast (SINGULAIR) 10 MG tablet, TAKE 1 TABLET BY MOUTH AT BEDTIME,  Disp: 90 tablet, Rfl: 0   Multiple Vitamin (MULTIVITAMIN) tablet, Take 1 tablet by mouth daily., Disp: , Rfl:    norethindrone (MICRONOR) 0.35 MG tablet, Take 1 tablet (0.35 mg total) by mouth daily., Disp: 30 tablet, Rfl: 11   ondansetron (ZOFRAN) 4 MG tablet, Take 1 tablet (4 mg total) by mouth every 8 (eight) hours as needed for nausea or vomiting., Disp: 20 tablet, Rfl: 0   polyethylene glycol powder (GLYCOLAX/MIRALAX) 17 GM/SCOOP powder, Take 17 g by mouth 2 (two) times daily as needed for moderate constipation., Disp: 255 g, Rfl: 0   Sod Fluoride-Potassium Nitrate (PREVIDENT 5000 SENSITIVE) 1.1-5 % PSTE, , Disp: , Rfl:    trimethoprim-polymyxin b (POLYTRIM) ophthalmic solution, Place 1 drop into both eyes every 6 (six) hours., Disp: 10 mL, Rfl: 0   Medications ordered in this encounter:  Meds ordered this encounter  Medications   azithromycin (ZITHROMAX) 500 MG tablet    Sig: Take one tablet daily for 3 days    Dispense:  3 tablet    Refill:  0   benzonatate (TESSALON) 100 MG capsule    Sig: Take 1 capsule (100 mg total) by mouth every 8 (eight) hours for 5 days.    Dispense:  15 capsule    Refill:  0     *If you need refills on other medications prior to your next appointment, please contact your pharmacy*  Follow-Up: Call back or seek an in-person evaluation  if the symptoms worsen or if the condition fails to improve as anticipated.  [facility] [phone]  Other Instructions You were given a prescription for antibiotics. Please take the antibiotic prescription fully.   Follow up with your regular doctor in 1 week for reassessment and seek care sooner if your symptoms worsen or fail to improve.   If you have been instructed to have an in-person evaluation today at a local Urgent Care facility, please use the link below. It will take you to a list of all of our available [facility], including address, phone number and hours of operation. Please  do not delay care.  [facility]  If you or a family member do not have a primary care provider, use the link below to schedule a visit and establish care. When you choose a [facility] primary care physician or advanced practice provider, you gain a long-term partner in health. Find a Primary Care Provider  Learn more about [facility]'s in-office and virtual care options: [facility] - Get Care Now

## 2023-12-22 NOTE — Progress Notes (Signed)
 [name], Ana Knapp are scheduled for a virtual visit with your provider today.    Just as we do with appointments in the office, we must obtain your consent to participate.  Your consent will be active for this visit and any virtual visit you may have with one of our providers in the next 365 days.    If you have a MyChart account, I can also send a copy of this consent to you electronically.  All virtual visits are billed to your insurance company just like a traditional visit in the office.  As this is a virtual visit, video technology does not allow for your provider to perform a traditional examination.  This may limit your provider's ability to fully assess your condition.  If your provider identifies any concerns that need to be evaluated in person or the need to arrange testing such as labs, EKG, etc, we will make arrangements to do so.    Although advances in technology are sophisticated, we cannot ensure that it will always work on either your end or our end.  If the connection with a video visit is poor, we may have to switch to a telephone visit.  With either a video or telephone visit, we are not always able to ensure that we have a secure connection.   I need to obtain your verbal consent now.   Are you willing to proceed with your visit today?   Ana Knapp has provided verbal consent on 12/22/2023 for a virtual visit (video or telephone).   Karrie Meres, [place] 12/22/2023  6:37 PM   Date:  12/22/2023   ID:  Edom, Schmuhl 1981-06-07, MRN [id number]  Patient Location: Home Provider Location: Home Office   Participants: Patient and Provider for Visit and Wrap up  Method of visit: Video  Location of Patient: Home Location of Provider: Home Office Consent was obtain for visit over the video. Services rendered by provider: Visit was performed via video  A video enabled telemedicine application was used and I verified that I am speaking with the correct person using  two identifiers.  PCP:  Alba Cory, MD   Chief Complaint:  Ana Knapp  History of Present Illness:    Ana Knapp is a 43 y.o. female with history as stated below. Presents video telehealth for an acute care visit  Pt reports that for the last few weeks she has had sinus congestion for the last several weeks but this worsened over the last week. She reports she has also had a cough for the last week. Denies fevers, sob. She reports some chest wall pain, no substernal cp.   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  Past Medical History:  Diagnosis Date   Chronic sinusitis    Fibromyalgia    GERD (gastroesophageal reflux disease)    Headache    sinus   History of melanoma 11/22/2017   History of melanoma 2018   Back   May-Thurner syndrome 11/30/2015   January 2017; 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[facility]    PONV (postoperative nausea and vomiting)    Pulmonary embolism (HCC)     Current Meds  Medication Sig   azithromycin (ZITHROMAX) 500 MG tablet Take one tablet daily for 3 days   benzonatate (TESSALON) 100 MG capsule Take 1 capsule (100 mg total) by mouth every 8 (eight) hours for 5 days.  Allergies:   Ortho tri-cyclen [norgestimate-eth estradiol], Prednisone, Augmentin [amoxicillin-pot clavulanate], and Doxycycline   ROS See HPI for history of present illness.  Physical Exam Constitutional:      Appearance: Normal appearance.  HENT:     Nose: Congestion present.  Pulmonary:     Effort: Pulmonary effort is normal.     Comments: Speaking in full sentences Neurological:     Mental Status: She is alert.              MDM: Pt with sinus sxs and cough for over 1 week. Concern for bacterial sinusitis. Rx for abx and cough medication sent.   Tests Ordered: No orders of the defined types were placed in this encounter.   Medication Changes: Meds ordered this encounter   Medications   azithromycin (ZITHROMAX) 500 MG tablet    Sig: Take one tablet daily for 3 days    Dispense:  3 tablet    Refill:  0   benzonatate (TESSALON) 100 MG capsule    Sig: Take 1 capsule (100 mg total) by mouth every 8 (eight) hours for 5 days.    Dispense:  15 capsule    Refill:  0     Disposition:  Follow up  Signed, Karrie Meres, PA-C  12/22/2023 6:37 PM

## 2023-12-25 ENCOUNTER — Encounter: Payer: Self-pay | Admitting: Family Medicine

## 2023-12-26 ENCOUNTER — Other Ambulatory Visit: Payer: Self-pay | Admitting: Family Medicine

## 2023-12-26 DIAGNOSIS — J302 Other seasonal allergic rhinitis: Secondary | ICD-10-CM

## 2023-12-26 MED ORDER — MONTELUKAST SODIUM 10 MG PO TABS
10.0000 mg | ORAL_TABLET | Freq: Every day | ORAL | 0 refills | Status: AC
Start: 2023-12-26 — End: ?

## 2024-03-02 ENCOUNTER — Other Ambulatory Visit: Payer: Self-pay | Admitting: Family Medicine

## 2024-03-02 DIAGNOSIS — J302 Other seasonal allergic rhinitis: Secondary | ICD-10-CM

## 2024-03-08 ENCOUNTER — Other Ambulatory Visit: Payer: Self-pay | Admitting: Family Medicine

## 2024-03-08 DIAGNOSIS — J3089 Other allergic rhinitis: Secondary | ICD-10-CM

## 2024-03-08 DIAGNOSIS — J302 Other seasonal allergic rhinitis: Secondary | ICD-10-CM

## 2024-05-27 ENCOUNTER — Ambulatory Visit: Payer: Self-pay

## 2024-05-27 NOTE — Telephone Encounter (Signed)
 FYI Only or Action Required?: Action required by provider: request for appointment.  Patient was last seen in primary care on 10/08/2023 by Lavell Bari LABOR, FNP.  Called Nurse Triage reporting Neurologic Problem.  Symptoms began several weeks ago.  Interventions attempted: OTC medications: pain medication and Ice/heat application.  Symptoms are: gradually worsening.  Triage Disposition: See PCP When Office is Open (Within 3 Days)  Patient/caregiver understands and will follow disposition?: No, refuses disposition                             Copied from CRM #[id number]. Topic: Clinical - Red Word Triage >> May 27, 2024  4:51 PM Teressa P wrote: Red Word that prompted transfer to Nurse Triage: numbness and tingling pain in right and left arm at times Reason for Disposition  [1] Numbness or tingling on both sides of body AND [2] is a new symptom present > 24 hours  Answer Assessment - Initial Assessment Questions 1. SYMPTOM: What is the main symptom you are concerned about? (e.g., weakness, numbness)     Right and left arm numbness, states right arm is more problematic 2. ONSET: When did this start? (e.g., minutes, hours, days; while sleeping)     A few weeks to a month  4. PATTERN Does this come and go, or has it been constant since it started?  Is it present now?     Comes and goes, more frequent when she wakes or after she has been holding something for long periods of time.  5. CARDIAC SYMPTOMS: Have you had any of the following symptoms: chest pain, difficulty breathing, palpitations?     Denies chest pain, denies difficulty breathing  6. NEUROLOGIC SYMPTOMS: Have you had any of the following symptoms: headache, dizziness, vision loss, double vision, changes in speech, unsteady on your feet?     Denies dizziness, denies vision changes, denies changes in speech, denies headaches worse than baseline, denies facial drooping, denies feeling  off-balance 7. OTHER SYMPTOMS: Do you have any other symptoms?     Pain (rates 6-7), states pain goes away throughout day, irregular menstrual cycles    States she has received a cortisone injection for carpal tunnel in the past. Patient requested appointment after 06/07/24. Please advise on scheduling, as this is after recommended disposition. Patient would like to see Mliss Spray.  Protocols used: Neurologic Deficit-A-AH

## 2024-05-28 NOTE — Telephone Encounter (Signed)
 Appt sch'd for 8/18/ with Julie

## 2024-06-10 ENCOUNTER — Ambulatory Visit
Admission: RE | Admit: 2024-06-10 | Discharge: 2024-06-10 | Disposition: A | Discharge status: Home / Self Care | Attending: Nurse Practitioner | Admitting: Nurse Practitioner

## 2024-06-10 ENCOUNTER — Ambulatory Visit: Payer: Self-pay | Admitting: Nurse Practitioner

## 2024-06-10 ENCOUNTER — Ambulatory Visit
Admission: RE | Admit: 2024-06-10 | Discharge: 2024-06-10 | Disposition: A | Discharge status: Home / Self Care | Source: Ambulatory Visit | Attending: Nurse Practitioner

## 2024-06-10 ENCOUNTER — Encounter: Payer: Self-pay | Admitting: Nurse Practitioner

## 2024-06-10 ENCOUNTER — Ambulatory Visit: Admitting: Nurse Practitioner

## 2024-06-10 VITALS — BP 116/72 | HR 83 | Temp 97.8°F | Resp 18 | Ht 69.0 in | Wt 166.5 lb

## 2024-06-10 DIAGNOSIS — Z13220 Encounter for screening for lipoid disorders: Secondary | ICD-10-CM

## 2024-06-10 DIAGNOSIS — R200 Anesthesia of skin: Secondary | ICD-10-CM | POA: Insufficient documentation

## 2024-06-10 DIAGNOSIS — R202 Paresthesia of skin: Secondary | ICD-10-CM | POA: Insufficient documentation

## 2024-06-10 DIAGNOSIS — E559 Vitamin D deficiency, unspecified: Secondary | ICD-10-CM

## 2024-06-10 DIAGNOSIS — E611 Iron deficiency: Secondary | ICD-10-CM

## 2024-06-10 DIAGNOSIS — Z131 Encounter for screening for diabetes mellitus: Secondary | ICD-10-CM

## 2024-06-10 DIAGNOSIS — E538 Deficiency of other specified B group vitamins: Secondary | ICD-10-CM

## 2024-06-10 DIAGNOSIS — Z130 Encounter for screening for diseases of the blood and blood-forming organs and certain disorders involving the immune mechanism: Secondary | ICD-10-CM | POA: Diagnosis not present

## 2024-06-10 NOTE — Progress Notes (Signed)
 BP 116/72   Pulse 83   Temp 97.8 F (36.6 C)   Resp 18   Ht 5' 9 (1.753 m)   Wt 166 lb 8 oz (75.5 kg)   LMP 06/03/2024   SpO2 98%   BMI 24.59 kg/m    Subjective:    Patient ID: Ana Knapp, female    DOB: [birth year], 43 y.o.   MRN: [id number]  HPI: Ana Knapp is a 43 y.o. female  Chief Complaint  Patient presents with   Numbness    And tingling x2 weeks right hand, wrist and arm    Discussed the use of AI scribe software for clinical note transcription with the patient, who gave verbal consent to proceed.  History of Present Illness [name] is a 43 year old female who presents with numbness and tingling in her right arm and hand.  Paresthesia of upper extremities - Numbness and tingling in right arm and hand for several weeks - Symptoms most pronounced in the morning upon waking - Sensation extends throughout the right arm - Mild symptoms present in the left arm, significantly less severe than right - No recent trauma or accidents - Wears a brace at work, which provides some relief - Exercises three times a week and questions if symptoms are related to physical activity  Vitamin b12 deficiency - History of low B12 levels, described as 'super low' at last visit - Concern that B12 deficiency may be contributing to current paresthesia  Menstrual irregularity on progestin-only contraception - On norethindrone  0.35 mg daily since November - Initially had normal menstrual cycles - Increased frequency of periods over the past 1.5 months, sometimes as soon as one week apart - History of blood clots with prior estrogen-containing birth control, avoids estrogen-containing contraceptives  Constipation - Persistent constipation despite taking Miralax  twice daily - Linzess  was effective but unaffordable due to insurance coverage - Low water intake and high caffeine consumption may contribute to constipation  Fibromyalgia - Diagnosed  with fibromyalgia - Regular exercise is important to prevent flare-ups - Continues to exercise regularly to manage symptoms  Metabolic and endocrine risk factors - Family history of diabetes (mother affected) - Interest in checking thyroid  and diabetes status         06/10/2024    9:20 AM 06/14/2022    1:11 PM 01/31/2022    2:02 PM  Depression screen PHQ 2/9  Decreased Interest 0 0 0  Down, Depressed, Hopeless 0 0 0  PHQ - 2 Score 0 0 0  Altered sleeping 0    Tired, decreased energy 3    Change in appetite 0    Feeling bad or failure about yourself  0    Trouble concentrating 0    Moving slowly or fidgety/restless 0    Suicidal thoughts 0    PHQ-9 Score 3    Difficult doing work/chores Not difficult at all      Relevant past medical, surgical, family and social history reviewed and updated as indicated. Interim medical history since our last visit reviewed. Allergies and medications reviewed and updated.  Review of Systems  Ten systems reviewed and is negative except as mentioned in HPI      Objective:     BP 116/72   Pulse 83   Temp 97.8 F (36.6 C)   Resp 18   Ht 5' 9 (1.753 m)   Wt 166 lb 8 oz (75.5 kg)   LMP 06/03/2024   SpO2 98%  BMI 24.59 kg/m    Wt Readings from Last 3 Encounters:  06/10/24 166 lb 8 oz (75.5 kg)  09/28/23 160 lb (72.6 kg)  09/14/23 157 lb 6.4 oz (71.4 kg)    Physical Exam Physical Exam GENERAL: Alert, cooperative, well developed, no acute distress. HEENT: Normocephalic, normal oropharynx, moist mucous membranes. CHEST: Clear to auscultation bilaterally, no wheezes, rhonchi, or crackles. CARDIOVASCULAR: Normal heart rate and rhythm, S1 and S2 normal without murmurs. ABDOMEN: Soft, non-tender, non-distended, without organomegaly, normal bowel sounds. EXTREMITIES: No cyanosis or edema. MUSCULOSKELETAL: Neck tender on palpation. Hand grip strength normal. Arm strength normal. Arm abduction strength normal. NEUROLOGICAL: Cranial  nerves grossly intact, moves all extremities without gross motor or sensory deficit.           Assessment & Plan:   Problem List Items Addressed This Visit   None Visit Diagnoses       Numbness and tingling    -  Primary   Relevant Orders   CBC with Differential/Platelet   Comprehensive metabolic panel with GFR   Lipid panel   Hemoglobin A1c   VITAMIN D  25 Hydroxy (Vit-D Deficiency, Fractures)   B12 and Folate Panel   Magnesium    Iron , TIBC and Ferritin Panel   TSH   DG Cervical Spine Complete     Vitamin B 12 deficiency       Relevant Orders   B12 and Folate Panel     Vitamin D  deficiency       Relevant Orders   VITAMIN D  25 Hydroxy (Vit-D Deficiency, Fractures)     Screening for cholesterol level       Relevant Orders   Lipid panel     Screening for diabetes mellitus       Relevant Orders   Comprehensive metabolic panel with GFR   Hemoglobin A1c     Screening for deficiency anemia       Relevant Orders   CBC with Differential/Platelet   Iron , TIBC and Ferritin Panel        Assessment and Plan Assessment & Plan Numbness and tingling of right arm and hand Numbness and tingling primarily in the right arm and hand, with some symptoms in the left arm. Symptoms have been present for a few weeks, worse in the morning. No history of trauma or injury. Negative for carpal tunnel syndrome based on physical exam. Possible cervical radiculopathy or vitamin B12 deficiency. Tenderness in the neck area. Previous low vitamin B12 levels could contribute to neurological symptoms. - Order x-ray of the neck to evaluate for nerve impingement. - Order lab tests including vitamin B12, vitamin D , iron , thyroid  function, and A1c.  Constipation Chronic constipation managed with Miralax  twice daily. Previous use of Linzess  was effective but not affordable due to insurance coverage. Decreased water intake and high caffeine consumption may contribute to constipation. - Encourage increased  water intake and dietary fiber. - Continue Miralax  twice daily.  Abnormal uterine bleeding on progestin-only contraception Abnormal uterine bleeding occurring more frequently on norethindrone  0.35 mg since November. History of blood clots with estrogen-containing contraceptives. Possible low iron  levels due to increased bleeding. - Order lab tests including iron  levels. - reach out to OB/GYN  Fibromyalgia Fibromyalgia managed with regular exercise to prevent flare-ups, as inactivity may worsen symptoms. - Encourage continued regular exercise.  Vitamin B12 deficiency Low vitamin B12 levels could contribute to neurological symptoms such as numbness and tingling. - Order vitamin B12 level as part of lab work.  Follow up plan: Return if symptoms worsen or fail to improve.

## 2024-06-11 LAB — LIPID PANEL
Cholesterol: 162 mg/dL (ref <200)
HDL: 74 mg/dL (ref >=50)
LDL Cholesterol (Calc): 77 mg/dL
Non-HDL Cholesterol (Calc): 88 mg/dL (ref <130)
Total CHOL/HDL Ratio: 2.2 (calc) (ref <5.0)
Triglycerides: 37 mg/dL (ref <150)

## 2024-06-11 LAB — CBC WITH DIFFERENTIAL/PLATELET
Absolute Lymphocytes: 1842 {cells}/uL (ref 850–3900)
Absolute Monocytes: 392 {cells}/uL (ref 200–950)
Basophils Absolute: 50 {cells}/uL (ref 0–200)
Basophils Relative: 0.9 %
Eosinophils Absolute: 454 {cells}/uL (ref 15–500)
Eosinophils Relative: 8.1 %
HCT: 37.4 % (ref 35.0–45.0)
Hemoglobin: 12.2 g/dL (ref 11.7–15.5)
MCH: 27.1 pg (ref 27.0–33.0)
MCHC: 32.6 g/dL (ref 32.0–36.0)
MCV: 82.9 fL (ref 80.0–100.0)
MPV: 10.1 fL (ref 7.5–12.5)
Monocytes Relative: 7 %
Neutro Abs: 2862 {cells}/uL (ref 1500–7800)
Neutrophils Relative %: 51.1 %
Platelets: 243 Thousand/uL (ref 140–400)
RBC: 4.51 Million/uL (ref 3.80–5.10)
RDW: 12.7 % (ref 11.0–15.0)
Total Lymphocyte: 32.9 %
WBC: 5.6 Thousand/uL (ref 3.8–10.8)

## 2024-06-11 LAB — COMPREHENSIVE METABOLIC PANEL WITH GFR
AG Ratio: 1.9 (calc) (ref 1.0–2.5)
ALT: 9 U/L (ref 6–29)
AST: 13 U/L (ref 10–35)
Albumin: 4.5 g/dL (ref 3.6–5.1)
Alkaline phosphatase (APISO): 25 U/L — ABNORMAL LOW (ref 31–125)
BUN: 15 mg/dL (ref 7–25)
CO2: 27 mmol/L (ref 20–32)
Calcium: 9.1 mg/dL (ref 8.6–10.2)
Chloride: 105 mmol/L (ref 98–110)
Creat: 0.91 mg/dL (ref 0.50–0.99)
Globulin: 2.4 g/dL (ref 1.9–3.7)
Glucose, Bld: 89 mg/dL (ref 65–99)
Potassium: 4.7 mmol/L (ref 3.5–5.3)
Sodium: 138 mmol/L (ref 135–146)
Total Bilirubin: 0.6 mg/dL (ref 0.2–1.2)
Total Protein: 6.9 g/dL (ref 6.1–8.1)
eGFR: 79 mL/min/1.73m2 (ref >=60)

## 2024-06-11 LAB — IRON,TIBC AND FERRITIN PANEL
%SAT: 17 % (ref 16–45)
Ferritin: 6 ng/mL — ABNORMAL LOW (ref 16–232)
Iron: 72 ng/mL — AB (ref 40–232)
TIBC: 428 ug/dL (ref 250–450)

## 2024-06-11 LAB — HEMOGLOBIN A1C
Hgb A1c MFr Bld: 5.7 % — ABNORMAL HIGH (ref <5.7)
Mean Plasma Glucose: 117 mg/dL
eAG (mmol/L): 6.5 mmol/L

## 2024-06-11 LAB — TSH: TSH: 1.97 m[IU]/L

## 2024-06-11 LAB — VITAMIN D 25 HYDROXY (VIT D DEFICIENCY, FRACTURES): Vit D, 25-Hydroxy: 34 ng/mL (ref 30–100)

## 2024-06-11 LAB — B12 AND FOLATE PANEL
Folate: 8.5 ng/mL
Vitamin B-12: 275 pg/mL (ref 200–1100)

## 2024-06-11 LAB — MAGNESIUM: Magnesium: 2.4 mg/dL (ref 1.5–2.5)

## 2024-06-11 MED ORDER — IRON (FERROUS SULFATE) 325 (65 FE) MG PO TABS
325.0000 mg | ORAL_TABLET | Freq: Every day | ORAL | 1 refills | Status: AC
Start: 2024-06-11 — End: ?

## 2024-06-17 ENCOUNTER — Other Ambulatory Visit: Payer: Self-pay | Admitting: Certified Nurse Midwife

## 2024-06-18 ENCOUNTER — Encounter: Payer: Self-pay | Admitting: Certified Nurse Midwife

## 2024-07-11 ENCOUNTER — Telehealth: Admitting: Physician Assistant

## 2024-07-11 DIAGNOSIS — B9689 Other specified bacterial agents as the cause of diseases classified elsewhere: Secondary | ICD-10-CM | POA: Diagnosis not present

## 2024-07-11 DIAGNOSIS — J019 Acute sinusitis, unspecified: Secondary | ICD-10-CM

## 2024-07-11 DIAGNOSIS — R110 Nausea: Secondary | ICD-10-CM

## 2024-07-11 MED ORDER — ONDANSETRON 4 MG PO TBDP: 4.0000 mg | ORAL_TABLET | Freq: Three times a day (TID) | ORAL | 0 refills | Status: DC | PRN

## 2024-07-11 MED ORDER — AZITHROMYCIN 250 MG PO TABS: ORAL_TABLET | ORAL | 0 refills | Status: AC

## 2024-07-11 MED ORDER — BENZONATATE 100 MG PO CAPS: 100.0000 mg | ORAL_CAPSULE | Freq: Three times a day (TID) | ORAL | 0 refills | Status: DC | PRN

## 2024-07-11 NOTE — Patient Instructions (Signed)
 Ana Knapp, thank you for joining [name], PA-C for today's virtual visit.  While this provider is not your primary care provider (PCP), if your PCP is located in our provider database this encounter information will be shared with them immediately following your visit.   A [facility] MyChart account gives you access to today's visit and all your visits, tests, and labs performed at [facility]  click here if you don't have a [facility] MyChart account or go to [email or web address]  Consent: (Patient) Ana Knapp provided verbal consent for this virtual visit at the beginning of the encounter.  Current Medications:  Current Outpatient Medications:    azithromycin  (ZITHROMAX ) 250 MG tablet, Take 2 tablets on day 1, then 1 tablet daily on days 2 through 5, Disp: 6 tablet, Rfl: 0   benzonatate  (TESSALON ) 100 MG capsule, Take 1 capsule (100 mg total) by mouth 3 (three) times daily as needed for cough., Disp: 30 capsule, Rfl: 0   ondansetron  (ZOFRAN -ODT) 4 MG disintegrating tablet, Take 1 tablet (4 mg total) by mouth every 8 (eight) hours as needed for nausea or vomiting., Disp: 20 tablet, Rfl: 0   acetaminophen  (TYLENOL ) 325 MG tablet, Take 650 mg by mouth every 6 (six) hours as needed., Disp: , Rfl:    aspirin 81 MG EC tablet, Take 1 tablet by mouth daily., Disp: , Rfl:    esomeprazole  (NEXIUM ) 40 MG capsule, Take 1 capsule (40 mg total) by mouth 2 (two) times daily before a meal., Disp: 180 capsule, Rfl: 2   Iron , Ferrous Sulfate , 325 (65 Fe) MG TABS, Take 325 mg by mouth daily., Disp: 90 tablet, Rfl: 1   levocetirizine (XYZAL ) 5 MG tablet, Take 1 tablet (5 mg total) by mouth every evening., Disp: 30 tablet, Rfl: 11   montelukast  (SINGULAIR ) 10 MG tablet, Take 1 tablet (10 mg total) by mouth at bedtime., Disp: 30 tablet, Rfl: 0   norethindrone  (MICRONOR ) 0.35 MG tablet, Take 1 tablet (0.35 mg total) by mouth daily., Disp: 30 tablet, Rfl: 11    Medications ordered in this encounter:  Meds ordered this encounter  Medications   benzonatate  (TESSALON ) 100 MG capsule    Sig: Take 1 capsule (100 mg total) by mouth 3 (three) times daily as needed for cough.    Dispense:  30 capsule    Refill:  0    Supervising Provider:   LAMPTEY, PHILIP O [[id number]]   azithromycin  (ZITHROMAX ) 250 MG tablet    Sig: Take 2 tablets on day 1, then 1 tablet daily on days 2 through 5    Dispense:  6 tablet    Refill:  0    Supervising Provider:   LAMPTEY, PHILIP O [[id number]]   ondansetron  (ZOFRAN -ODT) 4 MG disintegrating tablet    Sig: Take 1 tablet (4 mg total) by mouth every 8 (eight) hours as needed for nausea or vomiting.    Dispense:  20 tablet    Refill:  0    Supervising Provider:   LAMPTEY, PHILIP O [[id number]]     *If you need refills on other medications prior to your next appointment, please contact your pharmacy*  Follow-Up: Call back or seek an in-person evaluation if the symptoms worsen or if the condition fails to improve as anticipated.  [facility] [phone]  Other Instructions Please take antibiotic as directed.  Increase fluid intake.  Use Saline nasal spray.  Take a daily multivitamin. Take the Tessalon  for cough and use  the Zofran  as directed, when needed, for nausea. Continue your allergy medication regimen.  Place a humidifier in the bedroom.  Please call or return clinic if symptoms are not improving.  Sinusitis Sinusitis is redness, soreness, and swelling (inflammation) of the paranasal sinuses. Paranasal sinuses are air pockets within the bones of your face (beneath the eyes, the middle of the forehead, or above the eyes). In healthy paranasal sinuses, mucus is able to drain out, and air is able to circulate through them by way of your nose. However, when your paranasal sinuses are inflamed, mucus and air can become trapped. This can allow bacteria and other germs to grow and cause infection. Sinusitis can  develop quickly and last only a short time (acute) or continue over a long period (chronic). Sinusitis that lasts for more than 12 weeks is considered chronic.  CAUSES  Causes of sinusitis include: Allergies. Structural abnormalities, such as displacement of the cartilage that separates your nostrils (deviated septum), which can decrease the air flow through your nose and sinuses and affect sinus drainage. Functional abnormalities, such as when the small hairs (cilia) that line your sinuses and help remove mucus do not work properly or are not present. SYMPTOMS  Symptoms of acute and chronic sinusitis are the same. The primary symptoms are pain and pressure around the affected sinuses. Other symptoms include: Upper toothache. Earache. Headache. Bad breath. Decreased sense of smell and taste. A cough, which worsens when you are lying flat. Fatigue. Fever. Thick drainage from your nose, which often is green and may contain pus (purulent). Swelling and warmth over the affected sinuses. DIAGNOSIS  Your caregiver will perform a physical exam. During the exam, your caregiver may: Look in your nose for signs of abnormal growths in your nostrils (nasal polyps). Tap over the affected sinus to check for signs of infection. View the inside of your sinuses (endoscopy) with a special imaging device with a light attached (endoscope), which is inserted into your sinuses. If your caregiver suspects that you have chronic sinusitis, one or more of the following tests may be recommended: Allergy tests. Nasal culture A sample of mucus is taken from your nose and sent to a lab and screened for bacteria. Nasal cytology A sample of mucus is taken from your nose and examined by your caregiver to determine if your sinusitis is related to an allergy. TREATMENT  Most cases of acute sinusitis are related to a viral infection and will resolve on their own within 10 days. Sometimes medicines are prescribed to help  relieve symptoms (pain medicine, decongestants, nasal steroid sprays, or saline sprays).  However, for sinusitis related to a bacterial infection, your caregiver will prescribe antibiotic medicines. These are medicines that will help kill the bacteria causing the infection.  Rarely, sinusitis is caused by a fungal infection. In theses cases, your caregiver will prescribe antifungal medicine. For some cases of chronic sinusitis, surgery is needed. Generally, these are cases in which sinusitis recurs more than 3 times per year, despite other treatments. HOME CARE INSTRUCTIONS  Drink plenty of water. Water helps thin the mucus so your sinuses can drain more easily. Use a humidifier. Inhale steam 3 to 4 times a day (for example, sit in the bathroom with the shower running). Apply a warm, moist washcloth to your face 3 to 4 times a day, or as directed by your caregiver. Use saline nasal sprays to help moisten and clean your sinuses. Take over-the-counter or prescription medicines for pain, discomfort, or fever only  as directed by your caregiver. SEEK IMMEDIATE MEDICAL CARE IF: You have increasing pain or severe headaches. You have nausea, vomiting, or drowsiness. You have swelling around your face. You have vision problems. You have a stiff neck. You have difficulty breathing. MAKE SURE YOU:  Understand these instructions. Will watch your condition. Will get help right away if you are not doing well or get worse. Document Released: 10/10/2005 Document Revised: 01/02/2012 Document Reviewed: 10/25/2011 [facility] Patient Information 2014 [place], [place].    If you have been instructed to have an in-person evaluation today at a local Urgent Care facility, please use the link below. It will take you to a list of all of our available [facility], including address, phone number and hours of operation. Please do not delay care.  [facility]  If you or a family member do not  have a primary care provider, use the link below to schedule a visit and establish care. When you choose a [facility] primary care physician or advanced practice provider, you gain a long-term partner in health. Find a Primary Care Provider  Learn more about [facility]'s in-office and virtual care options:  - Get Care Now

## 2024-07-11 NOTE — Progress Notes (Signed)
 Virtual Visit Consent   Ana Knapp, you are scheduled for a virtual visit with a [facility] provider today. Just as with appointments in the office, your consent must be obtained to participate. Your consent will be active for this visit and any virtual visit you may have with one of our providers in the next 365 days. If you have a MyChart account, a copy of this consent can be sent to you electronically.  As this is a virtual visit, video technology does not allow for your provider to perform a traditional examination. This may limit your provider's ability to fully assess your condition. If your provider identifies any concerns that need to be evaluated in person or the need to arrange testing (such as labs, EKG, etc.), we will make arrangements to do so. Although advances in technology are sophisticated, we cannot ensure that it will always work on either your end or our end. If the connection with a video visit is poor, the visit may have to be switched to a telephone visit. With either a video or telephone visit, we are not always able to ensure that we have a secure connection.  By engaging in this virtual visit, you consent to the provision of healthcare and authorize for your insurance to be billed (if applicable) for the services provided during this visit. Depending on your insurance coverage, you may receive a charge related to this service.  I need to obtain your verbal consent now. Are you willing to proceed with your visit today? Ana Knapp has provided verbal consent on 07/11/2024 for a virtual visit (video or telephone). Ana Knapp, [place]  Date: 07/11/2024 6:24 PM   Virtual Visit via Video Note   I, Ana Knapp, [name]  ([id number], November 22, 1980) on 07/11/24 at  6:30 PM EDT by a video-enabled telemedicine application and verified that I am speaking with the correct person using two identifiers.  Location: Patient: Virtual  Visit Location Patient: Home Provider: Virtual Visit Location Provider: Home Office   I discussed the limitations of evaluation and management by telemedicine and the availability of in person appointments. The patient expressed understanding and agreed to proceed.    History of Present Illness: Ana Knapp is a 43 y.o. who identifies as a female who was assigned female at birth, and is being seen today for sinus symptoms starting over the past 3-4 weeks. Notes initially more cold-like symptoms progressing into further chest congestion, persistent cough, sinus pressure/congestion and sinus pain. Denies fever, chills. Has been taking her allergy medications as directed. Now noting nausea secondary to level of PND. Denies SOB.  HPI: HPI  Problems:  Patient Active Problem List   Diagnosis Date Noted   Fibroid 09/14/2023   Pelvic pain 05/10/2022   Endometriosis 03/01/2018   Abnormal CT scan    History of melanoma 11/22/2017   Biliary dyskinesia 12/05/2016   Constipation 08/18/2016   May-Thurner syndrome 11/30/2015   DVT (deep venous thrombosis) (HCC) 11/13/2015   History of pulmonary embolism 11/13/2015   GERD (gastroesophageal reflux disease) 09/28/2015   Flushing 09/24/2015   Fibromyalgia 08/30/2015   Seasonal allergic rhinitis 08/30/2015   Fibrositis 08/30/2015    Allergies:  Allergies  Allergen Reactions   Ortho Tri-Cyclen [Norgestimate-Eth Estradiol ] Other (See Comments)    Blood clot and PE   Prednisone Anaphylaxis   Augmentin [Amoxicillin-Pot Clavulanate] Other (See Comments)    Upsets her stomach   Doxycycline      Nausea,  diarrhea   Medications:  Current Outpatient Medications:    azithromycin  (ZITHROMAX ) 250 MG tablet, Take 2 tablets on day 1, then 1 tablet daily on days 2 through 5, Disp: 6 tablet, Rfl: 0   benzonatate  (TESSALON ) 100 MG capsule, Take 1 capsule (100 mg total) by mouth 3 (three) times daily as needed for cough., Disp: 30 capsule, Rfl: 0    ondansetron  (ZOFRAN -ODT) 4 MG disintegrating tablet, Take 1 tablet (4 mg total) by mouth every 8 (eight) hours as needed for nausea or vomiting., Disp: 20 tablet, Rfl: 0   acetaminophen  (TYLENOL ) 325 MG tablet, Take 650 mg by mouth every 6 (six) hours as needed., Disp: , Rfl:    aspirin 81 MG EC tablet, Take 1 tablet by mouth daily., Disp: , Rfl:    esomeprazole  (NEXIUM ) 40 MG capsule, Take 1 capsule (40 mg total) by mouth 2 (two) times daily before a meal., Disp: 180 capsule, Rfl: 2   Iron , Ferrous Sulfate , 325 (65 Fe) MG TABS, Take 325 mg by mouth daily., Disp: 90 tablet, Rfl: 1   levocetirizine (XYZAL ) 5 MG tablet, Take 1 tablet (5 mg total) by mouth every evening., Disp: 30 tablet, Rfl: 11   montelukast  (SINGULAIR ) 10 MG tablet, Take 1 tablet (10 mg total) by mouth at bedtime., Disp: 30 tablet, Rfl: 0   norethindrone  (MICRONOR ) 0.35 MG tablet, Take 1 tablet (0.35 mg total) by mouth daily., Disp: 30 tablet, Rfl: 11  Observations/Objective: Patient is well-developed, well-nourished in no acute distress.  Resting comfortably  at home.  Head is normocephalic, atraumatic.  No labored breathing.  Speech is clear and coherent with logical content.  Patient is alert and oriented at baseline.   Assessment and Plan: 1. Acute bacterial sinusitis (Primary) - benzonatate  (TESSALON ) 100 MG capsule; Take 1 capsule (100 mg total) by mouth 3 (three) times daily as needed for cough.  Dispense: 30 capsule; Refill: 0 - azithromycin  (ZITHROMAX ) 250 MG tablet; Take 2 tablets on day 1, then 1 tablet daily on days 2 through 5  Dispense: 6 tablet; Refill: 0  2. Nausea - ondansetron  (ZOFRAN -ODT) 4 MG disintegrating tablet; Take 1 tablet (4 mg total) by mouth every 8 (eight) hours as needed for nausea or vomiting.  Dispense: 20 tablet; Refill: 0  Rx Azithromycin .  Increase fluids.  Rest.  Saline nasal spray.  Probiotic.  Mucinex  as directed.  Humidifier in bedroom. Tessalon  and Zofran  per orders.  Call or return  to clinic if symptoms are not improving.   Follow Up Instructions: I discussed the assessment and treatment plan with the patient. The patient was provided an opportunity to ask questions and all were answered. The patient agreed with the plan and demonstrated an understanding of the instructions.  A copy of instructions were sent to the patient via MyChart unless otherwise noted below.   The patient was advised to call back or seek an in-person evaluation if the symptoms worsen or if the condition fails to improve as anticipated.    Ana Velma Lunger, PA-C

## 2024-07-24 ENCOUNTER — Ambulatory Visit: Admitting: Certified Nurse Midwife

## 2024-07-27 ENCOUNTER — Other Ambulatory Visit: Payer: Self-pay | Admitting: Gastroenterology

## 2024-07-28 ENCOUNTER — Encounter: Payer: Self-pay | Admitting: Gastroenterology

## 2024-07-29 MED ORDER — ESOMEPRAZOLE MAGNESIUM 40 MG PO CPDR: 40.0000 mg | DELAYED_RELEASE_CAPSULE | Freq: Two times a day (BID) | ORAL | 0 refills | Status: DC

## 2024-07-30 ENCOUNTER — Encounter: Payer: Self-pay | Admitting: Certified Nurse Midwife

## 2024-07-30 ENCOUNTER — Ambulatory Visit: Admitting: Certified Nurse Midwife

## 2024-07-30 VITALS — BP 110/72 | HR 75 | Ht 69.0 in | Wt 168.9 lb

## 2024-07-30 DIAGNOSIS — N939 Abnormal uterine and vaginal bleeding, unspecified: Secondary | ICD-10-CM | POA: Diagnosis not present

## 2024-07-30 NOTE — Patient Instructions (Signed)
 Abnormal Uterine Bleeding    Abnormal uterine bleeding means bleeding more than normal from your womb (uterus). It can include:  Bleeding after sex.  Bleeding between monthly (menstrual) periods.  Bleeding that is heavier than normal.  Monthly periods that last longer than normal.  Bleeding after you have stopped having your monthly period (menopause).  You should see a doctor for any kind of bleeding that is not normal. Treatment depends on the cause of your bleeding and how much you bleed.  Follow these instructions at home:  Medicines  Take over-the-counter and prescription medicines only as told by your doctor.  Ask your doctor about:  Taking medicines such as aspirin and ibuprofen. Do not take these medicines unless your doctor tells you to take them.  Taking over-the-counter medicines, vitamins, herbs, and supplements.  You may be given iron pills. Take them as told by your doctor.  Managing constipation  If you take iron pills, you may need to take these actions to prevent or treat trouble pooping (constipation):  Drink enough fluid to keep your pee (urine) pale yellow.  Take over-the-counter or prescription medicines.  Eat foods that are high in fiber. These include beans, whole grains, and fresh fruits and vegetables.  Limit foods that are high in fat and sugar. These include fried or sweet foods.  Activity  Change your activity to decrease bleeding if you need to change your sanitary pad more than one time every 2 hours:  Lie in bed with your feet raised (elevated).  Place a cold pack on your lower belly.  Rest as much as you are able until the bleeding stops or slows down.  General instructions  Do not use tampons, douche, or have sex until your doctor says these things are okay.  Change your pads often.  Get regular exams. These include:  Pelvic exams.  Screenings for cancer of the cervix.  It is up to you to get the results of any tests that are done. Ask how to get your results when they are  ready.  Watch for any changes in your bleeding. For 2 months, write down:  When your monthly period starts.  When your monthly period ends.  When you get any abnormal bleeding from your vagina.  What problems you notice.  Keep all follow-up visits.  Contact a doctor if:  The bleeding lasts more than one week.  You feel dizzy at times.  You feel like you may vomit (nausea).  You vomit.  You feel light-headed or weak.  Your symptoms get worse.  Get help right away if:  You faint.  You have to change pads every hour.  You have pain in your belly.  You have a fever or chills.  You get sweaty or weak.  You pass large blood clots from your vagina.  These symptoms may be an emergency. Get help right away. Call your local emergency services (911 in the [place]  Do not wait to see if the symptoms will go away.  Do not drive yourself to the hospital.  Summary  Abnormal uterine bleeding means bleeding more than normal from your womb (uterus).  Any kind of bleeding that is not normal should be checked by a doctor.  Treatment depends on the cause of your bleeding and how much you bleed.  Get help right away if you faint, you have to change pads every hour, or you pass large blood clots from your vagina.  This information is not intended to replace  advice given to you by your health care provider. Make sure you discuss any questions you have with your health care provider.  Document Revised: 02/09/2021 Document Reviewed: 02/09/2021  Elsevier Patient Education  2024 [employer].

## 2024-07-30 NOTE — Progress Notes (Signed)
 GYN ENCOUNTER NOTE  Subjective:       Ana Knapp is a 43 y.o. G0P0000 female is here for gynecologic evaluation of the following issues:  1. Pt state she has been experiencing bleeding 2 weeks on then 1 week off for the past 2 months. She does note that since she has been taking the medication at the same time everyday that the bleeding has stopped. .     Gynecologic History Patient's last menstrual period was 07/08/2024 (exact date). Contraception: oral progesterone-only contraceptive Last Pap: 01/18/2018. Results were: normal, negative HPV Last mammogram: 12/15/2021. Results were: normal  Obstetric History OB History  Gravida Para Term Preterm AB Living  0 0 0 0 0 0  SAB IAB Ectopic Multiple Live Births  0 0 0 0 0    Past Medical History:  Diagnosis Date   Chronic sinusitis    Fibromyalgia    GERD (gastroesophageal reflux disease)    Headache    sinus   History of melanoma 11/22/2017   History of melanoma 2018   Back   May-Thurner syndrome 11/30/2015   January 2017; 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[facility]    PONV (postoperative nausea and vomiting)    Pulmonary embolism [facility])     Past Surgical History:  Procedure Laterality Date   ANGIOPLASTY / STENTING FEMORAL  March 2017   CHOLECYSTECTOMY     COLONOSCOPY WITH PROPOFOL  N/A 01/02/2018   Procedure: COLONOSCOPY WITH PROPOFOL ;  Surgeon: Jinny Carmine, MD;  Location: [facility] ENDOSCOPY;  Service: Endoscopy;  Laterality: N/A;   ESOPHAGOGASTRODUODENOSCOPY (EGD) WITH PROPOFOL  N/A 01/02/2018   Procedure: ESOPHAGOGASTRODUODENOSCOPY (EGD) WITH PROPOFOL ;  Surgeon: Jinny Carmine, MD;  Location: [facility] ENDOSCOPY;  Service: Endoscopy;  Laterality: N/A;   LAPAROSCOPY N/A 02/26/2018   Procedure: LAPAROSCOPY DIAGNOSTIC WITH BIOPSIES;  Surgeon: Connell Davies, MD;  Location: [facility] ORS;  Service: Gynecology;  Laterality: N/A;   Lytics Catheter Placement  11/16/15   MELANOMA EXCISION  2018   Back   ROBOTIC ASSISTED LAPAROSCOPIC CHOLECYSTECTOMY-SINGLE SITE   12/29/2016    Current Outpatient Medications on File Prior to Visit  Medication Sig Dispense Refill   acetaminophen  (TYLENOL ) 325 MG tablet Take 650 mg by mouth every 6 (six) hours as needed.     aspirin 81 MG EC tablet Take 1 tablet by mouth daily.     benzonatate  (TESSALON ) 100 MG capsule Take 1 capsule (100 mg total) by mouth 3 (three) times daily as needed for cough. 30 capsule 0   esomeprazole  (NEXIUM ) 40 MG capsule Take 1 capsule (40 mg total) by mouth 2 (two) times daily before a meal. 180 capsule 0   levocetirizine (XYZAL ) 5 MG tablet Take 1 tablet (5 mg total) by mouth every evening. 30 tablet 11   montelukast  (SINGULAIR ) 10 MG tablet Take 1 tablet (10 mg total) by mouth at bedtime. 30 tablet 0   Iron , Ferrous Sulfate , 325 (65 Fe) MG TABS Take 325 mg by mouth daily. (Patient not taking: Reported on 07/30/2024) 90 tablet 1   norethindrone  (MICRONOR ) 0.35 MG tablet Take 1 tablet (0.35 mg total) by mouth daily. 30 tablet 11   ondansetron  (ZOFRAN -ODT) 4 MG disintegrating tablet Take 1 tablet (4 mg total) by mouth every 8 (eight) hours as needed for nausea or vomiting. 20 tablet 0   No current facility-administered medications on file prior to visit.    Allergies  Allergen Reactions   Ortho Tri-Cyclen [Norgestimate-Eth Estradiol ] Other (See Comments)    Blood clot and PE   Prednisone Anaphylaxis  Augmentin [Amoxicillin-Pot Clavulanate] Other (See Comments)    Upsets her stomach   Doxycycline      Nausea, diarrhea    Social History   Socioeconomic History   Marital status: Married    Spouse name: Not on file   Number of children: Not on file   Years of education: Not on file   Highest education level: Associate degree: academic program  Occupational History   Not on file  Tobacco Use   Smoking status: Never   Smokeless tobacco: Never  Vaping Use   Vaping status: Never Used  Substance and Sexual Activity   Alcohol use: Not Currently    Comment: rare   Drug use: No    Sexual activity: Not Currently    Birth control/protection: None  Other Topics Concern   Not on file  Social History Narrative   Not on file   Social Drivers of Health   Financial Resource Strain: Medium Risk (06/09/2024)   Overall Financial Resource Strain (CARDIA)    Difficulty of Paying Living Expenses: Somewhat hard  Food Insecurity: No Food Insecurity (06/09/2024)   Hunger Vital Sign    Worried About Running Out of Food in the Last Year: Never true    Ran Out of Food in the Last Year: Never true  Transportation Needs: No Transportation Needs (06/09/2024)   PRAPARE - Administrator, Civil Service (Medical): No    Lack of Transportation (Non-Medical): No  Physical Activity: Insufficiently Active (06/09/2024)   Exercise Vital Sign    Days of Exercise per Week: 3 days    Minutes of Exercise per Session: 40 min  Stress: Stress Concern Present (06/09/2024)   [employer] of Occupational Health - Occupational Stress Questionnaire    Feeling of Stress: To some extent  Social Connections: Socially Integrated (06/09/2024)   Social Connection and Isolation Panel    Frequency of Communication with Friends and Family: More than three times a week    Frequency of Social Gatherings with Friends and Family: Twice a week    Attends Religious Services: More than 4 times per year    Active Member of [employer] or Organizations: Yes    Attends Banker Meetings: Patient declined    Marital Status: Married  Catering manager Violence: Not on file    Family History  Problem Relation Age of Onset   Thyroid  disease Mother    Diabetes Mother    Cancer Father        bladder   Bladder Cancer Father    Hypertension Maternal Grandmother    Hypothyroidism Maternal Grandmother    Thyroid  disease Paternal Grandmother    Diabetes Paternal Grandmother    Stroke Paternal Grandmother    Cancer Paternal Grandfather        skin   Heart disease Neg Hx    COPD Neg Hx     The  following portions of the patient's history were reviewed and updated as appropriate: allergies, current medications, past family history, past medical history, past social history, past surgical history and problem list.  Review of Systems Review of Systems - Negative except as mentioned in HPI Review of Systems - General ROS: negative for - chills, fatigue, fever, hot flashes, malaise or night sweats Hematological and Lymphatic ROS: negative for - bleeding problems or swollen lymph nodes Gastrointestinal ROS: negative for - abdominal pain, blood in stools, change in bowel habits and nausea/vomiting Musculoskeletal ROS: negative for - joint pain, muscle pain or muscular weakness Genito-Urinary  ROS: negative for - change in menstrual cycle, dysmenorrhea, dyspareunia, dysuria, genital discharge, genital ulcers, hematuria, incontinence, irregular/heavy menses, nocturia or pelvic pain. Positive for changes in her cycle.   Objective:   BP 110/72   Pulse 75   Ht 5' 9 (1.753 m)   Wt 168 lb 14.4 oz (76.6 kg)   LMP 07/08/2024 (Exact Date)   BMI 24.94 kg/m  CONSTITUTIONAL: Well-developed, well-nourished female in no acute distress.  HENT:  Normocephalic, atraumatic.  NECK: Normal range of motion, supple, no masses.  Normal thyroid .  SKIN: Skin is warm and dry. No rash noted. Not diaphoretic. No erythema. No pallor. NEUROLGIC: Alert and oriented to person, place, and time. PSYCHIATRIC: Normal mood and affect. Normal behavior. Normal judgment and thought content. CARDIOVASCULAR:Not Examined RESPIRATORY: Not Examined BREASTS: Not Examined ABDOMEN: Soft, non distended; Non tender.  No Organomegaly. PELVIC:not indicated MUSCULOSKELETAL: Normal range of motion. No tenderness.  No cyanosis, clubbing, or edema.     Assessment:   Irregular bleeding   Plan:   Discussed POP is time sensitive and the irregular taking of the pill maybe contributing to her bleeding. Discussed use of additional  progesterone in the future should her bleeding become abnormal . Given the bleeding has improved will not order at this time. She is in agreement. Follow up for annual exam and pap smear.   Zelda Hummer, CNM

## 2024-08-25 ENCOUNTER — Other Ambulatory Visit: Payer: Self-pay | Admitting: Gastroenterology

## 2024-09-13 ENCOUNTER — Other Ambulatory Visit: Payer: Self-pay | Admitting: Certified Nurse Midwife

## 2024-09-14 ENCOUNTER — Ambulatory Visit
Admission: EM | Admit: 2024-09-14 | Discharge: 2024-09-14 | Disposition: A | Discharge status: Home / Self Care | Attending: Physician Assistant | Admitting: Physician Assistant

## 2024-09-14 ENCOUNTER — Encounter: Payer: Self-pay | Admitting: Certified Nurse Midwife

## 2024-09-14 ENCOUNTER — Ambulatory Visit (INDEPENDENT_AMBULATORY_CARE_PROVIDER_SITE_OTHER)

## 2024-09-14 ENCOUNTER — Encounter: Payer: Self-pay | Admitting: Emergency Medicine

## 2024-09-14 DIAGNOSIS — R1031 Right lower quadrant pain: Secondary | ICD-10-CM

## 2024-09-14 DIAGNOSIS — N390 Urinary tract infection, site not specified: Secondary | ICD-10-CM | POA: Diagnosis not present

## 2024-09-14 DIAGNOSIS — R350 Frequency of micturition: Secondary | ICD-10-CM | POA: Insufficient documentation

## 2024-09-14 DIAGNOSIS — R319 Hematuria, unspecified: Secondary | ICD-10-CM | POA: Diagnosis not present

## 2024-09-14 DIAGNOSIS — R110 Nausea: Secondary | ICD-10-CM | POA: Diagnosis not present

## 2024-09-14 DIAGNOSIS — K5900 Constipation, unspecified: Secondary | ICD-10-CM | POA: Diagnosis not present

## 2024-09-14 LAB — POCT URINE DIPSTICK
Glucose, UA: NEGATIVE mg/dL
Nitrite, UA: NEGATIVE
Protein Ur, POC: 30 mg/dL — AB
Spec Grav, UA: 1.02 (ref 1.010–1.025)
Urobilinogen, UA: 1 U/dL
pH, UA: 5.5 (ref 5.0–8.0)

## 2024-09-14 LAB — POCT URINE PREGNANCY: Preg Test, Ur: NEGATIVE

## 2024-09-14 MED ORDER — ONDANSETRON 4 MG PO TBDP: 4.0000 mg | ORAL_TABLET | Freq: Three times a day (TID) | ORAL | 0 refills | Status: AC | PRN

## 2024-09-14 MED ORDER — CIPROFLOXACIN HCL 500 MG PO TABS: 500.0000 mg | ORAL_TABLET | Freq: Two times a day (BID) | ORAL | 0 refills | Status: AC

## 2024-09-14 NOTE — ED Provider Notes (Signed)
 [facility]    CSN: [id number] Arrival date & time: 09/14/24  1346      History   Chief Complaint Chief Complaint  Patient presents with   Abdominal Pain    RLQ    HPI Ana Knapp is a 43 y.o. female with history of fibromyalgia, GERD, fibroids, melanoma, endometriosis, and cholecystectomy presents for 3 day history of abdominal pain. Reports long history of constipation for which she takes Miralax  near daily. She had a little bit of diarrhea at onset. Feels constipated now. Pain is affecting the right upper and lower quadrants and radiating to the right flank.  Reports urinary frequency but denies difficulty urinating, painful urination, urgency, hematuria.  No abnormal vaginal discharge or odor.  No reported concern for STI.  Patient has had a reduced appetite and a low bit of nausea recently.  States her abdominal pain is not as bad today as it was at the onset.  HPI  Past Medical History:  Diagnosis Date   Chronic sinusitis    Fibromyalgia    GERD (gastroesophageal reflux disease)    Headache    sinus   History of melanoma 11/22/2017   History of melanoma 2018   Back   May-Thurner syndrome 11/30/2015   January 2017; 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[facility]    PONV (postoperative nausea and vomiting)    Pulmonary embolism [facility])     Patient Active Problem List   Diagnosis Date Noted   Fibroid 09/14/2023   Pelvic pain 05/10/2022   Endometriosis 03/01/2018   Abnormal CT scan    History of melanoma 11/22/2017   Biliary dyskinesia 12/05/2016   Constipation 08/18/2016   May-Thurner syndrome 11/30/2015   DVT (deep venous thrombosis) (HCC) 11/13/2015   History of pulmonary embolism 11/13/2015   GERD (gastroesophageal reflux disease) 09/28/2015   Flushing 09/24/2015   Fibromyalgia 08/30/2015   Seasonal allergic rhinitis 08/30/2015   Fibrositis 08/30/2015    Past Surgical History:  Procedure Laterality Date   ANGIOPLASTY / STENTING FEMORAL  March 2017    CHOLECYSTECTOMY     COLONOSCOPY WITH PROPOFOL  N/A 01/02/2018   Procedure: COLONOSCOPY WITH PROPOFOL ;  Surgeon: Jinny Carmine, MD;  Location: [facility] ENDOSCOPY;  Service: Endoscopy;  Laterality: N/A;   ESOPHAGOGASTRODUODENOSCOPY (EGD) WITH PROPOFOL  N/A 01/02/2018   Procedure: ESOPHAGOGASTRODUODENOSCOPY (EGD) WITH PROPOFOL ;  Surgeon: Jinny Carmine, MD;  Location: [facility] ENDOSCOPY;  Service: Endoscopy;  Laterality: N/A;   LAPAROSCOPY N/A 02/26/2018   Procedure: LAPAROSCOPY DIAGNOSTIC WITH BIOPSIES;  Surgeon: Connell Davies, MD;  Location: [facility] ORS;  Service: Gynecology;  Laterality: N/A;   Lytics Catheter Placement  11/16/15   MELANOMA EXCISION  2018   Back   ROBOTIC ASSISTED LAPAROSCOPIC CHOLECYSTECTOMY-SINGLE SITE  12/29/2016    OB History     Gravida  0   Para  0   Term  0   Preterm  0   AB  0   Living  0      SAB  0   IAB  0   Ectopic  0   Multiple  0   Live Births  0            Home Medications    Prior to Admission medications   Medication Sig Start Date End Date Taking? Authorizing Provider  ciprofloxacin  (CIPRO ) 500 MG tablet Take 1 tablet (500 mg total) by mouth every 12 (twelve) hours for 7 days. 09/14/24 09/21/24 Yes Arvis Huxley B, PA-C  ondansetron  (ZOFRAN -ODT) 4 MG disintegrating tablet Take 1 tablet (4  mg total) by mouth every 8 (eight) hours as needed. 09/14/24  Yes Arvis Jolan NOVAK, PA-C  acetaminophen  (TYLENOL ) 325 MG tablet Take 650 mg by mouth every 6 (six) hours as needed.    [provider]  aspirin 81 MG EC tablet Take 1 tablet by mouth daily. 12/28/15   [provider]  esomeprazole  (NEXIUM ) 40 MG capsule Take 1 capsule (40 mg total) by mouth 2 (two) times daily before a meal. SCHEDULE OFFICE VISIT 08/27/24   Jinny Carmine, MD  Iron , Ferrous Sulfate , 325 (65 Fe) MG TABS Take 325 mg by mouth daily. Patient not taking: Reported on 07/30/2024 06/11/24   Gareth Mliss FALCON, FNP  levocetirizine (XYZAL ) 5 MG tablet Take 1 tablet (5 mg total) by mouth  every evening. 01/31/22   Gareth Mliss FALCON, FNP  montelukast  (SINGULAIR ) 10 MG tablet Take 1 tablet (10 mg total) by mouth at bedtime. 12/26/23   Sowles, Krichna, MD  norethindrone  (MICRONOR ) 0.35 MG tablet Take 1 tablet (0.35 mg total) by mouth daily. 09/14/23   Sebastian Sham, CNM    Family History Family History  Problem Relation Age of Onset   Thyroid  disease Mother    Diabetes Mother    Cancer Father        bladder   Bladder Cancer Father    Hypertension Maternal Grandmother    Hypothyroidism Maternal Grandmother    Thyroid  disease Paternal Grandmother    Diabetes Paternal Grandmother    Stroke Paternal Grandmother    Cancer Paternal Grandfather        skin   Heart disease Neg Hx    COPD Neg Hx     Social History Social History   Tobacco Use   Smoking status: Never   Smokeless tobacco: Never  Vaping Use   Vaping status: Never Used  Substance Use Topics   Alcohol use: Not Currently    Comment: rare   Drug use: No     Allergies   Ortho tri-cyclen [norgestimate-eth estradiol ], Prednisone, Augmentin [amoxicillin-pot clavulanate], and Doxycycline    Review of Systems Review of Systems  Constitutional:  Positive for appetite change. Negative for chills, diaphoresis, fatigue and fever.  Respiratory:  Negative for cough.   Cardiovascular:  Negative for chest pain.  Gastrointestinal:  Positive for abdominal pain, constipation and diarrhea. Negative for blood in stool, nausea and vomiting.  Genitourinary:  Positive for frequency. Negative for difficulty urinating, dysuria and urgency.  Musculoskeletal:  Positive for back pain. Negative for arthralgias and myalgias.  Skin:  Negative for rash.  Neurological:  Negative for weakness and headaches.  Hematological:  Negative for adenopathy.     Physical Exam Triage Vital Signs ED Triage Vitals  Encounter Vitals Group     BP 09/14/24 1409 121/77     Girls Systolic BP Percentile --      Girls Diastolic BP Percentile --       Boys Systolic BP Percentile --      Boys Diastolic BP Percentile --      Pulse Rate 09/14/24 1409 88     Resp 09/14/24 1409 14     Temp 09/14/24 1409 98.2 F (36.8 C)     Temp Source 09/14/24 1409 Oral     SpO2 09/14/24 1409 98 %     Weight 09/14/24 1407 168 lb 14 oz (76.6 kg)     Height 09/14/24 1407 5' 9 (1.753 m)     Head Circumference --      Peak Flow --  Pain Score 09/14/24 1407 5     Pain Loc --      Pain Education --      Exclude from Growth Chart --    No data found.  Updated Vital Signs BP 121/77 (BP Location: Right Arm)   Pulse 88   Temp 98.2 F (36.8 C) (Oral)   Resp 14   Ht 5' 9 (1.753 m)   Wt 168 lb 14 oz (76.6 kg)   LMP 09/04/2024 (Approximate)   SpO2 98%   BMI 24.94 kg/m    Physical Exam Vitals and nursing note reviewed.  Constitutional:      General: She is not in acute distress.    Appearance: Normal appearance. She is well-developed. She is not ill-appearing or toxic-appearing.  HENT:     Head: Normocephalic and atraumatic.  Eyes:     General: No scleral icterus.       Right eye: No discharge.        Left eye: No discharge.     Conjunctiva/sclera: Conjunctivae normal.  Cardiovascular:     Rate and Rhythm: Normal rate and regular rhythm.     Heart sounds: Normal heart sounds.  Pulmonary:     Effort: Pulmonary effort is normal. No respiratory distress.     Breath sounds: Normal breath sounds.  Abdominal:     Palpations: Abdomen is soft.     Tenderness: There is abdominal tenderness in the right upper quadrant and right lower quadrant. There is no right CVA tenderness, left CVA tenderness, guarding or rebound.  Musculoskeletal:     Cervical back: Neck supple.  Skin:    General: Skin is dry.  Neurological:     General: No focal deficit present.     Mental Status: She is alert. Mental status is at baseline.     Motor: No weakness.     Gait: Gait normal.  Psychiatric:        Mood and Affect: Mood normal.        Behavior:  Behavior normal.      UC Treatments / Results  Labs (all labs ordered are listed, but only abnormal results are displayed) Labs Reviewed  POCT URINE DIPSTICK - Abnormal; Notable for the following components:      Result Value   Clarity, UA cloudy (*)    Bilirubin, UA small (*)    Ketones, POC UA moderate (40) (*)    Blood, UA moderate (*)    Protein Ur, POC =30 (*)    Leukocytes, UA Trace (*)    All other components within normal limits  POCT URINE PREGNANCY - Normal  URINE CULTURE    EKG   Radiology DG Abdomen 1 View Result Date: 09/14/2024 CLINICAL DATA:  Right lower quadrant pain, right flank pain, constipation EXAM: ABDOMEN - 1 VIEW COMPARISON:  03/05/2021 FINDINGS: 2 upright frontal views of the abdomen and pelvis are obtained. No bowel obstruction or ileus. Scattered gas fluid levels are seen throughout the colon, which may reflect diarrhea or recent laxative/enema use. No significant fecal retention. No masses or abnormal calcifications. Left common iliac vein stent again noted. No acute bony abnormalities. IMPRESSION: 1. No bowel obstruction or ileus. 2. Scattered gas fluid levels throughout the colon which may reflect diarrhea or recent laxative/enema use. Electronically Signed   By: Ozell Daring M.D.   On: 09/14/2024 14:59    Procedures Procedures (including critical care time)  Medications Ordered in UC Medications - No data to display  Initial Impression /  Assessment and Plan / UC Course  I have reviewed the triage vital signs and the nursing notes.  Pertinent labs & imaging results that were available during my care of the patient were reviewed by me and considered in my medical decision making (see chart for details).   43 y/o female presents for right upper and lower abdominal pain with radiation to the right flank and associated constipation, urinary frequency, nausea and loss of appetite x 3 days. No fever. Symptoms have mildly improved from onset. History  of endometriosis, fibroids, fibromyalgia, and cholecystectomy.   Vitals normal and stable. Overall well appearing. NAD. On exam, she has TTP right upper and lower quadrants. No CVA tenderness. No guarding or rebound. Chest clear. Heart RRR.  UA: cloudy urine with ketones, RBCs and trace leuks. Urine will be sent for culture.   Urine pregnancy negative.  KUB obtained.  No stones or constipation.  Urinary tract infection suspected.  Sent Cipro  to pharmacy to cover for potential ascending urinary tract infection.  Encourage increasing fluids and rest.  Sent Zofran .  Advised if abdominal pain or flank pain worsens she should go to emergency department as she may need further labs, CT imaging to rule out other possibilities such as appendicitis, kidney stone, excetra.  Patient is agreeable.   Final Clinical Impressions(s) / UC Diagnoses   Final diagnoses:  Right lower quadrant pain  Urinary tract infection with hematuria, site unspecified  Nausea without vomiting  Urinary frequency     Discharge Instructions      UTI: Based on either symptoms or urinalysis, you may have a urinary tract infection. We will send the urine for culture and call with results in a few days. Begin antibiotics at this time. Your symptoms should be much improved over the next 2-3 days. Increase rest and fluid intake. If for some reason symptoms are worsening or not improving after a couple of days or the urine culture determines the antibiotics you are taking will not treat the infection, the antibiotics may be changed. Return or go to ER for fever, back pain, worsening urinary pain, discharge, increased blood in urine. May take Tylenol  or Motrin  OTC for pain relief or consider AZO if no contraindications   ABDOMINAL PAIN: You may take Tylenol  for pain relief. Use medications as directed including antiemetics and antidiarrheal medications if suggested or prescribed. You should increase fluids and electrolytes as well as  rest over these next several days. If you have any questions or concerns, or if your symptoms are not improving or if especially if they acutely worsen, please call or stop back to the clinic immediately and we will be happy to help you or go to the ER   ABDOMINAL PAIN RED FLAGS: Seek immediate further care if: symptoms remain the same or worsen over the next 3-7 days, you are unable to keep fluids down, you see blood or mucus in your stool, you vomit black or dark red material, you have a fever of 101.F or higher, you have localized and/or persistent abdominal pain       ED Prescriptions     Medication Sig Dispense Auth. Provider   ciprofloxacin  (CIPRO ) 500 MG tablet Take 1 tablet (500 mg total) by mouth every 12 (twelve) hours for 7 days. 14 tablet Arvis Huxley B, PA-C   ondansetron  (ZOFRAN -ODT) 4 MG disintegrating tablet Take 1 tablet (4 mg total) by mouth every 8 (eight) hours as needed. 15 tablet Arvis Huxley NOVAK, PA-C  PDMP not reviewed this encounter.   Arvis Jolan NOVAK, PA-C 09/14/24 1515

## 2024-09-14 NOTE — Discharge Instructions (Signed)
UTI: Based on either symptoms or urinalysis, you may have a urinary tract infection. We will send the urine for culture and call with results in a few days. Begin antibiotics at this time. Your symptoms should be much improved over the next 2-3 days. Increase rest and fluid intake. If for some reason symptoms are worsening or not improving after a couple of days or the urine culture determines the antibiotics you are taking will not treat the infection, the antibiotics may be changed. Return or go to ER for fever, back pain, worsening urinary pain, discharge, increased blood in urine. May take Tylenol or Motrin OTC for pain relief or consider AZO if no contraindications   ABDOMINAL PAIN: You may take Tylenol for pain relief. Use medications as directed including antiemetics and antidiarrheal medications if suggested or prescribed. You should increase fluids and electrolytes as well as rest over these next several days. If you have any questions or concerns, or if your symptoms are not improving or if especially if they acutely worsen, please call or stop back to the clinic immediately and we will be happy to help you or go to the ER   ABDOMINAL PAIN RED FLAGS: Seek immediate further care if: symptoms remain the same or worsen over the next 3-7 days, you are unable to keep fluids down, you see blood or mucus in your stool, you vomit black or dark red material, you have a fever of 101.F or higher, you have localized and/or persistent abdominal pain   

## 2024-09-14 NOTE — ED Triage Notes (Signed)
 Patient states that she thought she had a stomach bug on Wed.  Patient reports ongoing RLQ abdominal.  Patient reports nausea.  Patient denies V/D.  Patient unsure of fevers.

## 2024-09-16 LAB — URINE CULTURE

## 2024-09-17 ENCOUNTER — Ambulatory Visit (HOSPITAL_COMMUNITY): Payer: Self-pay

## 2024-09-17 ENCOUNTER — Other Ambulatory Visit: Payer: Self-pay

## 2024-09-17 MED ORDER — NORETHINDRONE 0.35 MG PO TABS: 1.0000 | ORAL_TABLET | Freq: Every day | ORAL | 1 refills | Status: DC

## 2024-09-21 ENCOUNTER — Other Ambulatory Visit: Payer: Self-pay | Admitting: Gastroenterology

## 2024-10-22 ENCOUNTER — Encounter: Payer: Self-pay | Admitting: Certified Nurse Midwife

## 2024-10-22 ENCOUNTER — Other Ambulatory Visit (HOSPITAL_COMMUNITY)
Admission: RE | Admit: 2024-10-22 | Discharge: 2024-10-22 | Disposition: A | Discharge status: Home / Self Care | Source: Ambulatory Visit | Attending: Certified Nurse Midwife | Admitting: Certified Nurse Midwife

## 2024-10-22 ENCOUNTER — Ambulatory Visit (INDEPENDENT_AMBULATORY_CARE_PROVIDER_SITE_OTHER): Admitting: Certified Nurse Midwife

## 2024-10-22 VITALS — BP 134/73 | HR 82 | Ht 69.0 in | Wt 166.1 lb

## 2024-10-22 DIAGNOSIS — Z1151 Encounter for screening for human papillomavirus (HPV): Secondary | ICD-10-CM | POA: Insufficient documentation

## 2024-10-22 DIAGNOSIS — Z01419 Encounter for gynecological examination (general) (routine) without abnormal findings: Secondary | ICD-10-CM

## 2024-10-22 DIAGNOSIS — Z8742 Personal history of other diseases of the female genital tract: Secondary | ICD-10-CM | POA: Diagnosis not present

## 2024-10-22 DIAGNOSIS — Z1231 Encounter for screening mammogram for malignant neoplasm of breast: Secondary | ICD-10-CM

## 2024-10-22 DIAGNOSIS — Z01411 Encounter for gynecological examination (general) (routine) with abnormal findings: Secondary | ICD-10-CM

## 2024-10-22 DIAGNOSIS — Z124 Encounter for screening for malignant neoplasm of cervix: Secondary | ICD-10-CM | POA: Diagnosis present

## 2024-10-22 DIAGNOSIS — N898 Other specified noninflammatory disorders of vagina: Secondary | ICD-10-CM | POA: Insufficient documentation

## 2024-10-22 NOTE — Progress Notes (Unsigned)
 "  ANNUAL EXAM Patient name: Ana Knapp MRN [id number]  Date of birth: 09/16/1981 Chief Complaint:   Annual Exam  History of Present Illness:   Ana Knapp is a 43 y.o. G0P0000 Caucasian female being seen today for a routine annual exam.  Current complaints: long menstrual cycles with heavy bleeding & pain despite norethindrone , has noticed less breakthrough/irregular bleeding since taking on schedule at 6pm. Reports increased vaginal discharge at times with irritation and concern for yeast.  5th grade teacher, private school Regular exercise, at least 3x/w Monogamous with female partner, declines STI screening Wears seatbelt, performs SBE No T/E/D  Patient's last menstrual period was 10/02/2024 (approximate).   Period Cycle (Days): 28 Period Duration (Days): 21 Period Pattern: Regular Menstrual Flow: Heavy, Moderate Menstrual Control: Maxi pad Menstrual Control Change Freq (Hours): 1-3 Dysmenorrhea: (!) Mild Dysmenorrhea Symptoms: Cramping, Nausea   Upstream - 10/22/24 1502       Pregnancy Intention Screening   Does the patient want to become pregnant in the next year? No    Does the patient's partner want to become pregnant in the next year? N/A    Would the patient like to discuss contraceptive options today? No      Contraception Wrap Up   Current Method Oral Contraceptive    End Method Oral Contraceptive    Contraception Counseling Provided No         The pregnancy intention screening data noted above was reviewed. Potential methods of contraception were discussed. The patient elected to proceed with Oral Contraceptive.   No results found for: DIAGPAP, HPVHIGH, ADEQPAP    Last pap 2019. Results were: NILM w/ HRHPV negative. H/O abnormal pap: no Last mammogram: 2023. Results were: normal. Family h/o breast cancer: no Last colonoscopy: 2019. Results were: normal. Family h/o colorectal cancer: no     10/22/2024    3:03 PM 06/10/2024     9:20 AM 06/14/2022    1:11 PM 01/31/2022    2:02 PM 09/15/2021   12:01 PM  Depression screen PHQ 2/9  Decreased Interest 0 0 0 0 0  Down, Depressed, Hopeless 0 0 0 0 0  PHQ - 2 Score 0 0 0 0 0  Altered sleeping  0   0  Tired, decreased energy  3   0  Change in appetite  0   0  Feeling bad or failure about yourself   0   0  Trouble concentrating  0   0  Moving slowly or fidgety/restless  0   0  Suicidal thoughts  0   0  PHQ-9 Score  3    0   Difficult doing work/chores  Not difficult at all   Not difficult at all     Data saved with a previous flowsheet row definition        06/10/2024    9:19 AM 04/13/2021    3:27 PM  GAD 7 : Generalized Anxiety Score  Nervous, Anxious, on Edge 0 2  Control/stop worrying 0 2  Worry too much - different things 0 2  Trouble relaxing 0 0  Restless 0 0  Easily annoyed or irritable 0 2  Afraid - awful might happen 0 0  Total GAD 7 Score 0 8  Anxiety Difficulty Not difficult at all Somewhat difficult      Past Medical History:  Diagnosis Date   Chronic sinusitis    Fibromyalgia    GERD (gastroesophageal reflux disease)    Headache  sinus   History of melanoma 11/22/2017   History of melanoma 2018   Back   May-Thurner syndrome 11/30/2015   January 2017; 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[facility]    PONV (postoperative nausea and vomiting)    Pulmonary embolism (HCC)     Family History  Problem Relation Age of Onset   Thyroid  disease Mother    Diabetes Mother    Cancer Father        bladder   Bladder Cancer Father    Hypertension Maternal Grandmother    Hypothyroidism Maternal Grandmother    Thyroid  disease Paternal Grandmother    Diabetes Paternal Grandmother    Stroke Paternal Grandmother    Cancer Paternal Grandfather        skin   Heart disease Neg Hx    COPD Neg Hx    Review of Systems:   Pertinent items are noted in HPI Denies any headaches, blurred vision, fatigue, shortness of breath, chest pain, abdominal pain, abnormal vaginal  discharge/itching/odor/irritation, problems with periods, bowel movements, urination, or intercourse unless otherwise stated above. Pertinent History Reviewed:  Reviewed past medical,surgical, social and family history.  Reviewed problem list, medications and allergies. Physical Assessment:   Vitals:   10/22/24 1458  BP: 134/73  Pulse: 82  Weight: 166 lb 1.6 oz (75.3 kg)  Height: 5' 9 (1.753 m)  Body mass index is 24.53 kg/m.       Physical Exam Vitals reviewed.  Constitutional:      General: She is not in acute distress.    Appearance: Normal appearance.  HENT:     Head: Normocephalic.  Neck:     Thyroid : No thyroid  mass or thyromegaly.  Cardiovascular:     Rate and Rhythm: Normal rate and regular rhythm.     Heart sounds: Normal heart sounds.  Pulmonary:     Effort: Pulmonary effort is normal.     Breath sounds: Normal breath sounds.  Chest:  Breasts:    Tanner Score is 5.     Right: Normal.     Left: Normal.  Abdominal:     General: Abdomen is flat.     Palpations: Abdomen is soft.     Tenderness: There is no abdominal tenderness.  Genitourinary:    General: Normal vulva.     Tanner stage (genital): 5.     Labia:        Right: No rash, tenderness or lesion.        Left: No rash, tenderness or lesion.      Vagina: Normal.     Cervix: Friability present. No cervical motion tenderness.     Uterus: Not enlarged and not tender.      Adnexa:        Right: No mass or tenderness.         Left: No mass or tenderness.    Musculoskeletal:     Cervical back: Neck supple. No tenderness.  Lymphadenopathy:     Upper Body:     Right upper body: No axillary adenopathy.     Left upper body: No axillary adenopathy.  Skin:    General: Skin is warm and dry.  Neurological:     General: No focal deficit present.     Mental Status: She is alert and oriented to person, place, and time.  Psychiatric:        Mood and Affect: Mood normal.        Behavior: Behavior normal.       No results found  for this or any previous visit (from the past 24 hours).  Assessment & Plan:  1. Well woman exam (Primary)  2. Encounter for screening for human papillomavirus (HPV) - Cytology - PAP  3. Breast cancer screening by mammogram - MM 3D SCREENING MAMMOGRAM BILATERAL BREAST; Future  4. Cervical cancer screening - Cytology - PAP  5. Vaginal discharge - Cervicovaginal ancillary only  Continue norethindrone  0.35, refill provided, will send information re: other options for menstrual suppression that are safe with hx of PE/blood clot. Will notify via MyChart of test results.  Mammogram: schedule screening mammo as soon as possible, or sooner if problems Colonoscopy: per GI, or sooner if problems  No orders of the defined types were placed in this encounter.   Meds: No orders of the defined types were placed in this encounter.   Follow-up: Return in 1 year (on 10/22/2025) for Annual exam.  Harlene LITTIE Cisco, CNM 10/22/2024 3:07 PM  "

## 2024-10-22 NOTE — Patient Instructions (Addendum)
 How to Do a Breast Self-Exam Doing breast self-exams can help you stay healthy. They're one way to know what's normal for your breasts. They can help you catch a problem while it's still small and can be treated. You need to: Check your breasts often. Tell your doctor about any changes. You should do breast self-exams even if you have breast implants. What you need: A mirror. A well-lit room. A pillow or other soft object. How to do a breast self-exam Look for changes  Take off all the clothes above your waist. Stand in front of a mirror in a room with good lighting. Put your hands down at your sides. Compare your breasts in the mirror. Look for difference between them, such as: Differences in shape. Differences in size. Wrinkles, dips, and bumps in one breast and not the other. Look at each breast for skin changes, such as: Redness. Scaly spots. Spots where your skin is thicker. Dimpling. Open sores. Look for changes in your nipples, such as: Fluid coming out of a nipple. Fluid around a nipple. Bleeding. Dimpling. Redness. A nipple that looks pushed in or that has changed position. Feel for changes Lie on your back. Feel each breast. To do this: Pick a breast to feel. Place a pillow under the shoulder closest to that breast. Put the arm closest to that breast behind your head. Feel the breast using the hand of your other arm. Use the pads of your three middle fingers to make small circles starting near the nipple. Use light, medium, and firm pressure. Keep making circles, moving down over the breast. Stop when you feel your ribs. Start making circles with your fingers again, this time going up until you reach your collarbone. Then, make circles out across your breast and into your armpit area. Squeeze your nipple. Check for fluid and lumps. Do these steps again to check your other breast. Sit or stand in the tub or shower. With soapy water on your skin, feel each breast  the same way you did when you were lying down. Write down what you find Writing down what you find can help you keep track of what you want to tell your doctor. Write down: What's normal for each breast. Any changes you find. Write down: The kind of change. If your breast feels tender or painful. Any lump you find. Write down its size and where it is. When you last had your period. General tips If you're breastfeeding, the best time to check your breasts is after you feed your baby or after you use a breast pump. If you get a period, the best time to check your breasts is 5-7 days after your period ends. With time, you'll get more used to doing the self-exam. You'll also start to know if there are changes in your breasts. Contact a doctor if: You see a change in the shape or size of your breasts or nipples. You see a change in the skin of your breast or nipples. You have fluid coming from your nipples that isn't normal. You find a new lump or thick area. You have breast pain. You have any concerns about your breast health. This information is not intended to replace advice given to you by your health care provider. Make sure you discuss any questions you have with your health care provider. Document Revised: 12/20/2023 Document Reviewed: 12/20/2023 Elsevier Patient Education  2025 [employer]. Pap Test: What to Know Why am I having this test? A Pap  test, also called a Pap smear, is a screening test to check for signs of: Infection. Cancer of the cervix. The cervix is the lowest part of the uterus. Precancerous changes. These are changes that may be a sign that cancer is developing. Females need this test regularly. In general, you should have a Pap test every 3 years until you reach menopause or you are 45 years old. If you are 51-37 years old you may choose to have their Pap test done at the same time as an human papillomavirus (HPV) test every 5 years instead of every 3  years. Your health care provider may recommend having Pap tests more or less often depending on your medical conditions and past Pap test results. What is being tested? Cervical cells are tested for signs of infection or abnormalities. What kind of sample is taken?  Your provider will collect a sample of cells from the surface of your cervix. This will be done using a small cotton swab, plastic spatula, or brush that is inserted into your vagina using a tool called a speculum. This sample is often collected during a pelvic exam, when you are lying on your back on an exam table with your feet in footrests, called stirrups. In some cases, fluids (secretions) from the cervix or vagina may also be collected. How do I prepare for this test? Know where you are in your menstrual cycle. If you're menstruating on the day of the test, you may be asked to reschedule. You may need to reschedule if you have a known vaginal infection on the day of the test. Follow instructions from your provider about: Changing or stopping your regular medicines. Some medicines, such as vaginal medicines and tetracycline, can cause abnormal test results. Avoiding douching 2-3 days before or the day of the test. Tell a health care provider about: Any allergies you have. All medicines you take. These include vitamins, herbs, eye drops, and creams. Any bleeding problems you have. Any surgeries you've had. Any medical problems you have. Whether you're pregnant or may be pregnant. How are the results reported? Your test results will be reported as either abnormal or normal. What do the results mean? A normal test result means that you do not have signs of cancer of the cervix. An abnormal result may mean that you have: Cancer. A Pap test by itself is not enough to diagnose cancer. You will have more tests done if cancer is suspected. Precancerous changes in your cervix. Inflammation of the cervix. A sexually transmitted  infection (STI). A fungal infection. An infection from a parasite. Talk with your provider about what your results mean. More tests may be needed. Questions to ask your health care provider Ask your provider, or the department that is doing the test: When will my results be ready? How will I get my results? What are my treatment options? What other tests do I need? What are my next steps? This information is not intended to replace advice given to you by your health care provider. Make sure you discuss any questions you have with your health care provider. Document Revised: 12/30/2023 Document Reviewed: 12/30/2023 Elsevier Patient Education  2025 [employer]. Health Maintenance, Female Adopting a healthy lifestyle and getting preventive care are important in promoting health and wellness. Ask your health care provider about: The right schedule for you to have regular tests and exams. Things you can do on your own to prevent diseases and keep yourself healthy. What should I know about  diet, weight, and exercise? Eat a healthy diet  Eat a diet that includes plenty of vegetables, fruits, low-fat dairy products, and lean protein. Do not eat a lot of foods that are high in solid fats, added sugars, or sodium. Maintain a healthy weight Body mass index (BMI) is used to identify weight problems. It estimates body fat based on height and weight. Your health care provider can help determine your BMI and help you achieve or maintain a healthy weight. Get regular exercise Get regular exercise. This is one of the most important things you can do for your health. Most adults should: Exercise for at least 150 minutes each week. The exercise should increase your heart rate and make you sweat (moderate-intensity exercise). Do strengthening exercises at least twice a week. This is in addition to the moderate-intensity exercise. Spend less time sitting. Even light physical activity can be  beneficial. Watch cholesterol and blood lipids Have your blood tested for lipids and cholesterol at 45 years of age, then have this test every 5 years. Have your cholesterol levels checked more often if: Your lipid or cholesterol levels are high. You are older than 45 years of age. You are at high risk for heart disease. What should I know about cancer screening? Depending on your health history and family history, you may need to have cancer screening at various ages. This may include screening for: Breast cancer. Cervical cancer. Colorectal cancer. Skin cancer. Lung cancer. What should I know about heart disease, diabetes, and high blood pressure? Blood pressure and heart disease High blood pressure causes heart disease and increases the risk of stroke. This is more likely to develop in people who have high blood pressure readings or are overweight. Have your blood pressure checked: Every 3-5 years if you are 76-68 years of age. Every year if you are 79 years old or older. Diabetes Have regular diabetes screenings. This checks your fasting blood sugar level. Have the screening done: Once every three years after age 81 if you are at a normal weight and have a low risk for diabetes. More often and at a younger age if you are overweight or have a high risk for diabetes. What should I know about preventing infection? Hepatitis B If you have a higher risk for hepatitis B, you should be screened for this virus. Talk with your health care provider to find out if you are at risk for hepatitis B infection. Hepatitis C Testing is recommended for: Everyone born from [birth year] through 1967. Anyone with known risk factors for hepatitis C. Sexually transmitted infections (STIs) Get screened for STIs, including gonorrhea and chlamydia, if: You are sexually active and are younger than 45 years of age. You are older than 45 years of age and your health care provider tells you that you are at risk for  this type of infection. Your sexual activity has changed since you were last screened, and you are at increased risk for chlamydia or gonorrhea. Ask your health care provider if you are at risk. Ask your health care provider about whether you are at high risk for HIV. Your health care provider may recommend a prescription medicine to help prevent HIV infection. If you choose to take medicine to prevent HIV, you should first get tested for HIV. You should then be tested every 3 months for as long as you are taking the medicine. Pregnancy If you are about to stop having your period (premenopausal) and you may become pregnant, seek counseling before  you get pregnant. Take 400 to 800 micrograms (mcg) of folic acid  every day if you become pregnant. Ask for birth control (contraception) if you want to prevent pregnancy. Osteoporosis and menopause Osteoporosis is a disease in which the bones lose minerals and strength with aging. This can result in bone fractures. If you are 23 years old or older, or if you are at risk for osteoporosis and fractures, ask your health care provider if you should: Be screened for bone loss. Take a calcium or vitamin D  supplement to lower your risk of fractures. Be given hormone replacement therapy (HRT) to treat symptoms of menopause. Follow these instructions at home: Alcohol use Do not drink alcohol if: Your health care provider tells you not to drink. You are pregnant, may be pregnant, or are planning to become pregnant. If you drink alcohol: Limit how much you have to: 0-1 drink a day. Know how much alcohol is in your drink. In the [place] one drink equals one 12 oz bottle of beer (355 mL), one 5 oz glass of wine (148 mL), or one 1 oz glass of hard liquor (44 mL). Lifestyle Do not use any products that contain nicotine or tobacco. These products include cigarettes, chewing tobacco, and vaping devices, such as e-cigarettes. If you need help quitting, ask your health  care provider. Do not use street drugs. Do not share needles. Ask your health care provider for help if you need support or information about quitting drugs. General instructions Schedule regular health, dental, and eye exams. Stay current with your vaccines. Tell your health care provider if: You often feel depressed. You have ever been abused or do not feel safe at home. Summary Adopting a healthy lifestyle and getting preventive care are important in promoting health and wellness. Follow your health care provider's instructions about healthy diet, exercising, and getting tested or screened for diseases. Follow your health care provider's instructions on monitoring your cholesterol and blood pressure. This information is not intended to replace advice given to you by your health care provider. Make sure you discuss any questions you have with your health care provider. Document Revised: 03/01/2021 Document Reviewed: 03/01/2021 Elsevier Patient Education  2024 [employer].

## 2024-10-25 ENCOUNTER — Other Ambulatory Visit: Payer: Self-pay | Admitting: Gastroenterology

## 2024-10-25 LAB — CERVICOVAGINAL ANCILLARY ONLY
Bacterial Vaginitis (gardnerella): NEGATIVE
Candida Glabrata: NEGATIVE
Candida Vaginitis: POSITIVE — AB
Comment: NEGATIVE
Comment: NEGATIVE
Comment: NEGATIVE

## 2024-10-28 ENCOUNTER — Ambulatory Visit: Payer: Self-pay | Admitting: Certified Nurse Midwife

## 2024-10-28 MED ORDER — FLUCONAZOLE 150 MG PO TABS: 150.0000 mg | ORAL_TABLET | ORAL | 0 refills | Status: AC

## 2024-10-29 LAB — CYTOLOGY - PAP
Comment: NEGATIVE
Diagnosis: NEGATIVE
High risk HPV: NEGATIVE

## 2024-11-08 ENCOUNTER — Other Ambulatory Visit: Payer: Self-pay | Admitting: Certified Nurse Midwife

## 2024-11-09 ENCOUNTER — Telehealth: Admitting: Physician Assistant

## 2024-11-09 DIAGNOSIS — R0982 Postnasal drip: Secondary | ICD-10-CM

## 2024-11-09 DIAGNOSIS — J329 Chronic sinusitis, unspecified: Secondary | ICD-10-CM

## 2024-11-09 MED ORDER — AZITHROMYCIN 250 MG PO TABS: ORAL_TABLET | ORAL | 0 refills | Status: AC

## 2024-11-09 MED ORDER — BENZONATATE 100 MG PO CAPS: 100.0000 mg | ORAL_CAPSULE | Freq: Two times a day (BID) | ORAL | 0 refills | Status: AC | PRN

## 2024-11-09 NOTE — Progress Notes (Signed)
 " Virtual Visit Consent   Ana Knapp, you are scheduled for a virtual visit with a [facility] provider today. Just as with appointments in the office, your consent must be obtained to participate. Your consent will be active for this visit and any virtual visit you may have with one of our providers in the next 365 days. If you have a MyChart account, a copy of this consent can be sent to you electronically.  As this is a virtual visit, video technology does not allow for your provider to perform a traditional examination. This may limit your provider's ability to fully assess your condition. If your provider identifies any concerns that need to be evaluated in person or the need to arrange testing (such as labs, EKG, etc.), we will make arrangements to do so. Although advances in technology are sophisticated, we cannot ensure that it will always work on either your end or our end. If the connection with a video visit is poor, the visit may have to be switched to a telephone visit. With either a video or telephone visit, we are not always able to ensure that we have a secure connection.  By engaging in this virtual visit, you consent to the provision of healthcare and authorize for your insurance to be billed (if applicable) for the services provided during this visit. Depending on your insurance coverage, you may receive a charge related to this service.  I need to obtain your verbal consent now. Are you willing to proceed with your visit today? Ana Knapp has provided verbal consent on 11/09/2024 for a virtual visit (video or telephone). [name], [place]  Date: 11/09/2024 1:54 PM   Virtual Visit via Video Note   I, [name], [name]  ([id number], [birth year]) on 11/09/24 at  2:00 PM EST by a video-enabled telemedicine application and verified that I am speaking with the correct person using two identifiers.  Location: Patient: Virtual Visit  Location Patient: Home Provider: Virtual Visit Location Provider: Home Office   I discussed the limitations of evaluation and management by telemedicine and the availability of in person appointments. The patient expressed understanding and agreed to proceed.    History of Present Illness: Ana Knapp is a 44 y.o. who identifies as a female who was assigned female at birth, and is being seen today for sinus issues.  HPI: Sinusitis This is a new problem. The current episode started in the past 7 days. The problem is unchanged. There has been no fever. The pain is mild. Associated symptoms include congestion and sinus pressure. Past treatments include oral decongestants and nasal decongestants. The treatment provided mild relief.    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  Resting comfortably  at home.  Head is normocephalic, atraumatic.  No labored breathing.  Speech is clear and coherent with logical content.  Patient is alert and  oriented at baseline.    Assessment and Plan: 1. Sinusitis, unspecified chronicity, unspecified location (Primary)  Presentation consistent with sinusitis.  No evidence of other bacterial infections including pneumonia, meningitis, pharyngitis, otitis media.  Discussed that this fits picture of   bacterial sinus itis and that due to type and duration of symptoms and exam findings, we will treat as bacterial sinusitis.  Antibiotics prescribed. Advised to continue ibuprofen  and Tylenol  at home. Patient is to follow up with primary physician if having continued symptoms.   Advised patient on supportive therapies, including using a cool-mist vaporizer/humidifier/steam from hot showers, OTC throat lozenges, advancement of fluids as tolerated, nasal saline sprays, rest, OTC acetaminophen  or ibuprofen  for pain control, frequent handwashing.  Follow Up Instructions: I discussed the assessment and treatment plan with the patient. The patient was provided an opportunity to ask questions and all were answered. The patient agreed with the plan and demonstrated an understanding of the instructions.  A copy of instructions were sent to the patient via MyChart unless otherwise noted below.    The patient was advised to call back or seek an in-person evaluation if the symptoms worsen or if the condition fails to improve as anticipated.    [name], PA-C     [1]  Allergies Allergen Reactions   Ortho Tri-Cyclen [Norgestimate-Eth Estradiol ] Other (See Comments)    Blood clot and PE   Prednisone Anaphylaxis   Augmentin [Amoxicillin-Pot Clavulanate] Other (See Comments)    Upsets her stomach   Doxycycline      Nausea, diarrhea  [2]  Current Outpatient Medications:    acetaminophen  (TYLENOL ) 325 MG tablet, Take 650 mg by mouth every 6 (six) hours as needed., Disp: , Rfl:    aspirin 81 MG EC tablet, Take 1 tablet by mouth daily., Disp: , Rfl:    esomeprazole  (NEXIUM ) 40 MG capsule, Take 1 capsule (40 mg total) by mouth 2 (two) times daily before a meal. LAST REFILL SCHEDULE VISIT, Disp: 60 capsule, Rfl: 0   Iron , Ferrous Sulfate , 325 (65 Fe) MG TABS, Take 325 mg by mouth daily., Disp: 90 tablet, Rfl: 1   levocetirizine (XYZAL ) 5 MG tablet, Take 1 tablet (5 mg total) by mouth every evening., Disp:  30 tablet, Rfl: 11   montelukast  (SINGULAIR ) 10 MG tablet, Take 1 tablet (10 mg total) by mouth at bedtime., Disp: 30 tablet, Rfl: 0   norethindrone  (MICRONOR ) 0.35 MG tablet, TAKE 1 TABLET BY MOUTH EVERY DAY, Disp: 90 tablet, Rfl: 3   ondansetron  (ZOFRAN -ODT) 4 MG disintegrating tablet, Take 1 tablet (4 mg total) by mouth every 8 (eight) hours as needed., Disp: 15 tablet, Rfl: 0  "

## 2024-11-09 NOTE — Patient Instructions (Signed)
" °  Ana Knapp, thank you for joining [name], PA-C for today's virtual visit.  While this provider is not your primary care provider (PCP), if your PCP is located in our provider database this encounter information will be shared with them immediately following your visit.   A [facility] MyChart account gives you access to today's visit and all your visits, tests, and labs performed at [facility]  click here if you don't have a [facility] MyChart account or go to [email or web address]  Consent: (Patient) Ana Knapp provided verbal consent for this virtual visit at the beginning of the encounter.  Current Medications:  Current Outpatient Medications:    acetaminophen  (TYLENOL ) 325 MG tablet, Take 650 mg by mouth every 6 (six) hours as needed., Disp: , Rfl:    aspirin 81 MG EC tablet, Take 1 tablet by mouth daily., Disp: , Rfl:    esomeprazole  (NEXIUM ) 40 MG capsule, Take 1 capsule (40 mg total) by mouth 2 (two) times daily before a meal. LAST REFILL SCHEDULE VISIT, Disp: 60 capsule, Rfl: 0   Iron , Ferrous Sulfate , 325 (65 Fe) MG TABS, Take 325 mg by mouth daily., Disp: 90 tablet, Rfl: 1   levocetirizine (XYZAL ) 5 MG tablet, Take 1 tablet (5 mg total) by mouth every evening., Disp: 30 tablet, Rfl: 11   montelukast  (SINGULAIR ) 10 MG tablet, Take 1 tablet (10 mg total) by mouth at bedtime., Disp: 30 tablet, Rfl: 0   norethindrone  (MICRONOR ) 0.35 MG tablet, TAKE 1 TABLET BY MOUTH EVERY DAY, Disp: 90 tablet, Rfl: 3   ondansetron  (ZOFRAN -ODT) 4 MG disintegrating tablet, Take 1 tablet (4 mg total) by mouth every 8 (eight) hours as needed., Disp: 15 tablet, Rfl: 0   Medications ordered in this encounter:  No orders of the defined types were placed in this encounter.    *If you need refills on other medications prior to your next appointment, please contact your pharmacy*  Follow-Up: Call back or seek an in-person evaluation if the symptoms worsen or if the  condition fails to improve as anticipated.  [facility] [phone]  Other Instructions Follow up with primary provider in 24-48 hours. Report to nearest ER with any worsening symptoms.    If you have been instructed to have an in-person evaluation today at a local Urgent Care facility, please use the link below. It will take you to a list of all of our available [facility], including address, phone number and hours of operation. Please do not delay care.  [facility]  If you or a family member do not have a primary care provider, use the link below to schedule a visit and establish care. When you choose a [facility] primary care physician or advanced practice provider, you gain a long-term partner in health. Find a Primary Care Provider  Learn more about [facility]'s in-office and virtual care options: [facility] Get Care Now  "

## 2024-11-18 ENCOUNTER — Other Ambulatory Visit: Payer: Self-pay | Admitting: Gastroenterology

## 2024-11-26 ENCOUNTER — Other Ambulatory Visit: Payer: Self-pay

## 2024-11-26 MED ORDER — ESOMEPRAZOLE MAGNESIUM 40 MG PO CPDR: 40.0000 mg | DELAYED_RELEASE_CAPSULE | Freq: Two times a day (BID) | ORAL | 0 refills | Status: AC

## 2024-12-23 ENCOUNTER — Ambulatory Visit: Payer: Self-pay
